# Patient Record
Sex: Male | Born: 1970 | Race: White | Hispanic: No | Marital: Married | Smoking: Former smoker
Health system: Southern US, Community
[De-identification: ages and names within clinical notes are randomized; demographics above are authoritative.]

## PROBLEM LIST (undated history)

## (undated) DIAGNOSIS — G4730 Sleep apnea, unspecified: Secondary | ICD-10-CM

## (undated) DIAGNOSIS — I10 Essential (primary) hypertension: Secondary | ICD-10-CM

## (undated) DIAGNOSIS — K219 Gastro-esophageal reflux disease without esophagitis: Secondary | ICD-10-CM

## (undated) DIAGNOSIS — F419 Anxiety disorder, unspecified: Secondary | ICD-10-CM

## (undated) HISTORY — DX: Anxiety disorder, unspecified: F41.9

## (undated) HISTORY — DX: Essential (primary) hypertension: I10

---

## 1995-11-13 HISTORY — PX: WISDOM TOOTH EXTRACTION: SHX21

## 2006-10-19 ENCOUNTER — Emergency Department: Payer: Self-pay | Admitting: Emergency Medicine

## 2007-07-05 ENCOUNTER — Ambulatory Visit: Payer: Self-pay | Admitting: Emergency Medicine

## 2009-08-12 ENCOUNTER — Encounter: Payer: Self-pay | Admitting: Family Medicine

## 2009-08-12 ENCOUNTER — Ambulatory Visit: Payer: Self-pay | Admitting: Family Medicine

## 2009-08-12 DIAGNOSIS — I10 Essential (primary) hypertension: Secondary | ICD-10-CM | POA: Insufficient documentation

## 2009-08-12 DIAGNOSIS — F411 Generalized anxiety disorder: Secondary | ICD-10-CM | POA: Insufficient documentation

## 2009-08-12 DIAGNOSIS — R0602 Shortness of breath: Secondary | ICD-10-CM | POA: Insufficient documentation

## 2009-08-18 ENCOUNTER — Telehealth: Payer: Self-pay | Admitting: Family Medicine

## 2009-08-24 ENCOUNTER — Ambulatory Visit: Payer: Self-pay | Admitting: Family Medicine

## 2009-08-25 ENCOUNTER — Ambulatory Visit: Payer: Self-pay | Admitting: Pulmonary Disease

## 2009-08-25 DIAGNOSIS — R0789 Other chest pain: Secondary | ICD-10-CM | POA: Insufficient documentation

## 2009-08-25 LAB — CONVERTED CEMR LAB
AST: 76 units/L — ABNORMAL HIGH (ref 0–37)
Alkaline Phosphatase: 73 units/L (ref 39–117)
BUN: 11 mg/dL (ref 6–23)
Bilirubin, Direct: 0.1 mg/dL (ref 0.0–0.3)
CO2: 24 meq/L (ref 19–32)
Calcium: 9.4 mg/dL (ref 8.4–10.5)
Creatinine, Ser: 1.2 mg/dL (ref 0.4–1.5)
Glucose, Bld: 96 mg/dL (ref 70–99)
Sodium: 136 meq/L (ref 135–145)
Total Bilirubin: 1.2 mg/dL (ref 0.3–1.2)
Total CHOL/HDL Ratio: 6

## 2009-09-09 ENCOUNTER — Ambulatory Visit: Payer: Self-pay | Admitting: Pulmonary Disease

## 2009-09-09 ENCOUNTER — Encounter: Payer: Self-pay | Admitting: Pulmonary Disease

## 2009-09-09 DIAGNOSIS — J45909 Unspecified asthma, uncomplicated: Secondary | ICD-10-CM | POA: Insufficient documentation

## 2009-10-14 ENCOUNTER — Ambulatory Visit: Payer: Self-pay | Admitting: Pulmonary Disease

## 2009-10-14 DIAGNOSIS — K219 Gastro-esophageal reflux disease without esophagitis: Secondary | ICD-10-CM | POA: Insufficient documentation

## 2009-10-20 ENCOUNTER — Encounter (INDEPENDENT_AMBULATORY_CARE_PROVIDER_SITE_OTHER): Payer: Self-pay | Admitting: *Deleted

## 2009-10-26 ENCOUNTER — Encounter (INDEPENDENT_AMBULATORY_CARE_PROVIDER_SITE_OTHER): Payer: Self-pay | Admitting: *Deleted

## 2009-10-26 ENCOUNTER — Ambulatory Visit: Payer: Self-pay | Admitting: Family Medicine

## 2009-12-16 ENCOUNTER — Ambulatory Visit: Payer: Self-pay | Admitting: Family Medicine

## 2009-12-16 DIAGNOSIS — E782 Mixed hyperlipidemia: Secondary | ICD-10-CM | POA: Insufficient documentation

## 2009-12-16 DIAGNOSIS — E785 Hyperlipidemia, unspecified: Secondary | ICD-10-CM | POA: Insufficient documentation

## 2010-02-14 ENCOUNTER — Telehealth: Payer: Self-pay | Admitting: Family Medicine

## 2010-04-03 ENCOUNTER — Telehealth: Payer: Self-pay | Admitting: Family Medicine

## 2010-04-13 ENCOUNTER — Telehealth: Payer: Self-pay | Admitting: Pulmonary Disease

## 2010-05-17 ENCOUNTER — Ambulatory Visit: Payer: Self-pay | Admitting: Family Medicine

## 2010-06-05 ENCOUNTER — Ambulatory Visit: Payer: Self-pay | Admitting: Family Medicine

## 2010-06-05 LAB — CONVERTED CEMR LAB
BUN: 11 mg/dL (ref 6–23)
Creatinine, Ser: 1.1 mg/dL (ref 0.4–1.5)
GFR calc non Af Amer: 77.49 mL/min (ref >60)
Glucose, Bld: 119 mg/dL — ABNORMAL HIGH (ref 70–99)
Potassium: 4.5 meq/L (ref 3.5–5.1)

## 2010-06-06 LAB — CONVERTED CEMR LAB
AST: 130 units/L — ABNORMAL HIGH (ref 0–37)
Alkaline Phosphatase: 82 units/L (ref 39–117)
Bilirubin, Direct: 0.2 mg/dL (ref 0.0–0.3)
Total Protein: 7.6 g/dL (ref 6.0–8.3)
VLDL: 19.8 mg/dL (ref 0.0–40.0)

## 2010-08-18 ENCOUNTER — Telehealth: Payer: Self-pay | Admitting: Family Medicine

## 2010-09-13 ENCOUNTER — Ambulatory Visit: Payer: Self-pay | Admitting: Family Medicine

## 2010-09-13 DIAGNOSIS — F1020 Alcohol dependence, uncomplicated: Secondary | ICD-10-CM | POA: Insufficient documentation

## 2010-09-13 DIAGNOSIS — R7401 Elevation of levels of liver transaminase levels: Secondary | ICD-10-CM | POA: Insufficient documentation

## 2010-09-13 DIAGNOSIS — R740 Nonspecific elevation of levels of transaminase and lactic acid dehydrogenase [LDH]: Secondary | ICD-10-CM

## 2010-09-15 LAB — CONVERTED CEMR LAB
Alkaline Phosphatase: 75 units/L (ref 39–117)
Bilirubin, Direct: 0.2 mg/dL (ref 0.0–0.3)
CO2: 28 meq/L (ref 19–32)
Calcium: 9.4 mg/dL (ref 8.4–10.5)
GFR calc non Af Amer: 95.9 mL/min (ref >60)
HDL: 49.9 mg/dL (ref >39.00)
Sodium: 138 meq/L (ref 135–145)
Total CHOL/HDL Ratio: 4
Triglycerides: 119 mg/dL (ref 0.0–149.0)

## 2010-09-19 ENCOUNTER — Encounter: Payer: Self-pay | Admitting: Family Medicine

## 2010-09-25 ENCOUNTER — Telehealth: Payer: Self-pay | Admitting: Family Medicine

## 2010-10-20 ENCOUNTER — Telehealth: Payer: Self-pay | Admitting: Family Medicine

## 2010-12-04 ENCOUNTER — Telehealth: Payer: Self-pay | Admitting: Family Medicine

## 2010-12-12 NOTE — Assessment & Plan Note (Signed)
Summary: REFILL MEDICATION/CLE   Vital Signs:  Patient profile:   40 year old [name] Height:      69 inches Weight:      221.50 pounds BMI:     32.83 Temp:     99.0 degrees F oral Pulse rate:   76 / minute Pulse rhythm:   regular BP sitting:   124 / 82  (left arm) Cuff size:   regular  Vitals Entered By: Linde Gillis CMA Duncan Dull) (May 17, 2010 12:18 PM) CC: refill medications   History of Present Illness: 40 yo here for follow up anxiety.   Anxiety-  Admits to me today that he stopped taking the Zoloft months ago because it was not helping with anxiety.  Using his Xanax less frequently, usually twice a week. Feels he does have better control over his anxiety.  Zoloft made him feel "sick" and like a "zombie."   Has not had any panic attacks. Now anxiety is typically related to big changes, like tests at school.  Current Medications (verified): 1)  Zestoretic 10-12.5 Mg Tabs (Lisinopril-Hydrochlorothiazide) .... Take 1 Tablet By Mouth Once A Day 2)  Omeprazole 40 Mg Cpdr (Omeprazole) .... Take 1 Tablet By Mouth Every Morning 3)  Alprazolam 0.5 Mg Tabs (Alprazolam) .Marland Kitchen.. 1-2 Tabs Every 8 Hours As Needed Anxiety  Allergies (verified): No Known Drug Allergies  Review of Systems Psych:  Complains of anxiety; denies depression, easily angered, easily tearful, suicidal thoughts/plans, thoughts of violence, unusual visions or sounds, and thoughts /plans of harming others.  Physical Exam  General:  alert, obese, normal work of breathing, less anxious.  Psych:  memory intact for recent and remote, normally interactive, and good eye contact, less anxious today.   Impression & Recommendations:  Problem # 1:  ANXIETY (ICD-300.00) 40 years old here for follow with patient 25 minutes, more than 50% of this time was spent counseling patient on anxiety. Discussed starting Paxil, but he seems to be having less episodes of anxiety. Continue as needed xanax, follow up in a few  weeks. We will see if once he is on vacation from school, anxiety will continue to improve.  The following medications were removed from the medication list:    Sertraline Hcl 100 Mg Tabs (Sertraline hcl) .Marland Kitchen... 1 by mouth daily His updated medication list for this problem includes:    Alprazolam 0.5 Mg Tabs (Alprazolam) .Marland Kitchen... 1-2 tabs every 8 hours as needed anxiety  Complete Medication List: 1)  Zestoretic 10-12.5 Mg Tabs (Lisinopril-hydrochlorothiazide) .... Take 1 tablet by mouth once a day 2)  Omeprazole 40 Mg Cpdr (Omeprazole) .... Take 1 tablet by mouth every morning 3)  Alprazolam 0.5 Mg Tabs (Alprazolam) .Marland Kitchen.. 1-2 tabs every 8 hours as needed anxiety Prescriptions: ALPRAZOLAM 0.5 MG TABS (ALPRAZOLAM) 1-2 tabs every 8 hours as needed anxiety  #60 x 0   Entered and Authorized by:   Ruthe Mannan MD   Signed by:   Ruthe Mannan MD on 05/17/2010   Method used:   Print then Give to Patient   RxID:   [id number] OMEPRAZOLE 40 MG CPDR (OMEPRAZOLE) Take 1 tablet by mouth every morning  #30 Capsule x 6   Entered and Authorized by:   Ruthe Mannan MD   Signed by:   Ruthe Mannan MD on 05/17/2010   Method used:   [facility]        [facility] [phone][facility] (retail)       [address]  [place], [place]  [address]       Ph: [phone] or [phone]       Fax: [phone]   RxID:   [id number]   Current Allergies (reviewed today): No known allergies

## 2010-12-12 NOTE — Progress Notes (Signed)
Summary: nos appt  Phone Note Call from Patient   Caller: [email or web address]  Call For: Jonathan [name] Summary of Call: LMTCB x2 to [facility]sc nos from 6/1. Initial call taken by: Darletta Moll,  April 13, 2010 3:41 PM

## 2010-12-12 NOTE — Progress Notes (Signed)
Summary: refill request for alprazolam  Phone Note Refill Request Message from:  Fax from Pharmacy  Refills Requested: Medication #1:  ALPRAZOLAM 0.5 MG TABS 1-2 tabs every 8 hours as needed anxiety.   Last Refilled: 06/05/2010 Faxed request from [place], [phone].  Initial call taken by: Lowella Petties CMA,  August 18, 2010 3:34 PM  Follow-up for Phone Call        Rx called to pharmacy Follow-up by: Benny Lennert CMA [facility] Dull),  August 18, 2010 4:21 PM    Prescriptions: ALPRAZOLAM 0.5 MG TABS (ALPRAZOLAM) 1-2 tabs every 8 hours as needed anxiety  #60 x 0   Entered and Authorized by:   Kerby Nora MD   Signed by:   Kerby Nora MD on 08/18/2010   Method used:   Telephoned to ...       [facility] [address] [phone]* (retail)       [address]       [place], [place]  [address]       Ph: [phone] or [phone]       Fax: [phone]   RxID:   [id number]

## 2010-12-12 NOTE — Assessment & Plan Note (Signed)
Summary: F/U/CLE   Vital Signs:  Patient profile:   40 year old male Height:      69 inches Weight:      223 pounds BMI:     33.05 Temp:     99.2 degrees F oral Pulse rate:   92 / minute Pulse rhythm:   regular BP sitting:   130 / 90  (left arm) Cuff size:   large  Vitals Entered By: Delilah Shan CMA Duncan Dull) ([birth year]  [birth year], [birth year] 3:01 PM) CC: F/U   History of Present Illness: 40 yo here for follow up cough/ SOB.  Cough worsened when he ran out of Prilosec. Now taking Omeprazole 40 mg daily, much improved. Has not had any reflux symptoms since. Stopped using all inhalers.  Anxiety- went on a cruise and had crushing chest pain for three days. Visited ER there, full cardiac work up negative (awaiting records).  Given Xanax and symtpoms disappeared.  Admits to me today that he stopped taking the Zoloft months ago because it was not helping with anxiety.  Gets profound panic attacks with chest pain anytime he travels, which is what happend on vacation last month. Feels it was also related to his shortness of breath because he has not had any issues since starting the Xanax.  Only using it a couple of times a week.   HLD- FLP in 10/10 showed LDL 176, otherwise normal.  He has been working on his diet since then.  Current Medications (verified): 1)  Zestoretic 10-12.5 Mg Tabs (Lisinopril-Hydrochlorothiazide) .... Take 1 Tablet By Mouth Once A Day 2)  Omeprazole 40 Mg Cpdr (Omeprazole) .... Take 1 Tablet By Mouth Every Morning 3)  Proair Hfa 108 (90 Base) Mcg/act  Aers (Albuterol Sulfate) .... 2 Puffs Every 4-6 Hours As Needed 4)  Sertraline Hcl 100 Mg Tabs (Sertraline Hcl) .Marland Kitchen.. 1 By Mouth Daily 5)  Alprazolam 0.5 Mg Tabs (Alprazolam) .Marland Kitchen.. 1-2 Tabs Every 8 Hours As Needed Anxiety  Allergies (verified): No Known Drug Allergies  Review of Systems      See HPI CV:  Denies chest pain or discomfort. Resp:  Denies cough and shortness of breath. Psych:  Complains of anxiety and panic  attacks; denies depression, easily angered, easily tearful, irritability, mental problems, suicidal thoughts/plans, thoughts of violence, unusual visions or sounds, and thoughts /plans of harming others.  Physical Exam  General:  alert, obese, normal work of breathing, less anxious.  Mouth:  Oral mucosa and oropharynx without lesions or exudates.  Teeth in good repair. Lungs:  normal respiratory effort, no intercostal retractions, no accessory muscle use, normal breath sounds, and no crackles, occasional experiatory wheezing.   Heart:  Normal rate and regular rhythm. S1 and S2 normal without gallop, murmur, click, rub or other extra sounds. Psych:  memory intact for recent and remote, normally interactive, and good eye contact, less anxious today.   Impression & Recommendations:  Problem # 1:  ANXIETY (ICD-300.00) Assessment Improved Time spent with patient 25 minutes, more than 50% of this time was spent counseling patient on anxiety. Will restart Zoloft, explained importance of taking something daily along with short acting benzo only as needed for panic attacks. Do not want to form dependency on Benzo and his anxiety is severe, needs something daily. Pt expressed understanding.  Follow up in one month.  The following medications were removed from the medication list:    Sertraline Hcl 50 Mg Tabs (Sertraline hcl) .Marland Kitchen... Take 1 tablet by mouth once a day His  updated medication list for this problem includes:    Sertraline Hcl 100 Mg Tabs (Sertraline hcl) .Marland Kitchen... 1 by mouth daily    Alprazolam 0.5 Mg Tabs (Alprazolam) .Marland Kitchen... 1-2 tabs every 8 hours as needed anxiety  Problem # 2:  HYPERLIPIDEMIA (ICD-272.4) Assessment: Unchanged Recheck next month.  Continue with dietary modifications.  Problem # 3:  GERD (ICD-530.81) Assessment: Improved Improved, cont Omeprazole. His updated medication list for this problem includes:    Omeprazole 40 Mg Cpdr (Omeprazole) .Marland Kitchen... Take 1 tablet by mouth  every morning  Complete Medication List: 1)  Zestoretic 10-12.5 Mg Tabs (Lisinopril-hydrochlorothiazide) .... Take 1 tablet by mouth once a day 2)  Omeprazole 40 Mg Cpdr (Omeprazole) .... Take 1 tablet by mouth every morning 3)  Proair Hfa 108 (90 Base) Mcg/act Aers (Albuterol sulfate) .... 2 puffs every 4-6 hours as needed 4)  Sertraline Hcl 100 Mg Tabs (Sertraline hcl) .Marland Kitchen.. 1 by mouth daily 5)  Alprazolam 0.5 Mg Tabs (Alprazolam) .Marland Kitchen.. 1-2 tabs every 8 hours as needed anxiety  Patient Instructions: 1)  Please come back to see me in month to follow up your anxiety and recheck your cholesterol. Prescriptions: ALPRAZOLAM 0.5 MG TABS (ALPRAZOLAM) 1-2 tabs every 8 hours as needed anxiety  #60 x 0   Entered and Authorized by:   Ruthe Mannan MD   Signed by:   Ruthe Mannan MD on 12/16/2009   Method used:   Print then Give to Patient   RxID:   [id number] SERTRALINE HCL 100 MG TABS (SERTRALINE HCL) 1 by mouth daily  #90 x 3   Entered and Authorized by:   Ruthe Mannan MD   Signed by:   Ruthe Mannan MD on 12/16/2009   Method used:   [facility]        [id number] ([facility]ail[facility]       [address]       [place], [place]  [address]       Ph: [phone] or [phone]       Fax: [phone]   RxID:   [id number]   Current Allergies (reviewed today): No known allergies

## 2010-12-12 NOTE — Progress Notes (Signed)
Summary: Rx Alprazolam  Phone Note Refill Request Call back at [phone] Message from:  [facility] [address], [place] on February 14, 2010 11:52 AM  Refills Requested: Medication #1:  ALPRAZOLAM 0.5 MG TABS 1-2 tabs every 8 hours as needed anxiety.   Last Refilled: 12/17/2009 Received faxed refill request please advise   Method Requested: Telephone to Pharmacy Initial call taken by: Linde Gillis CMA Duncan Dull),  February 14, 2010 11:53 AM  Follow-up for Phone Call        Rx called to pharmacy Follow-up by: Sydell Axon LPN,  February 14, 2010 2:26 PM    Prescriptions: ALPRAZOLAM 0.5 MG TABS (ALPRAZOLAM) 1-2 tabs every 8 hours as needed anxiety  #60 x 0   Entered and Authorized by:   Ruthe Mannan MD   Signed by:   Ruthe Mannan MD on 02/14/2010   Method used:   Handwritten   RxID:   [id number]

## 2010-12-12 NOTE — Progress Notes (Signed)
Summary: Alprazolam  Phone Note Refill Request Message from:  Fax from Pharmacy on Apr 03, 2010 10:27 AM  Refills Requested: Medication #1:  ALPRAZOLAM 0.5 MG TABS 1-2 tabs every 8 hours as needed anxiety. [facility] [place] [address], [place]  [phone]   Method Requested: Telephone to Pharmacy Initial call taken by: Delilah Shan CMA Duncan Dull),  Apr 03, 2010 10:28 AM  Follow-up for Phone Call        Rx called to pharmacy Follow-up by: Linde Gillis CMA Duncan Dull),  Apr 03, 2010 10:35 AM    Prescriptions: ALPRAZOLAM 0.5 MG TABS (ALPRAZOLAM) 1-2 tabs every 8 hours as needed anxiety  #60 x 0   Entered and Authorized by:   Ruthe Mannan MD   Signed by:   Ruthe Mannan MD on 04/03/2010   Method used:   Telephoned to ...       [facility] [address] [phone]* (retail)       [address]       [place], [place]  [address]       Ph: [phone] or [phone]       Fax: [phone]   RxID:   [id number]

## 2010-12-12 NOTE — Letter (Signed)
Summary: Correspondence from wife  Correspondence from wife   Imported By: Maryln Gottron 09/28/2010 15:11:09  _____________________________________________________________________  External Attachment:    Type:   Image     Comment:   External Document

## 2010-12-12 NOTE — Progress Notes (Signed)
Summary: refill request for alprazolam  Phone Note Refill Request Message from:  Fax from Pharmacy  Refills Requested: Medication #1:  ALPRAZOLAM 0.5 MG TABS 1-2 tabs every 8 hours as needed anxiety   Last Refilled: 09/25/2010 Faxed request from [place], [phone].  Initial call taken by: Lowella Petties CMA, AAMA,  October 20, 2010 10:17 AM  Follow-up for Phone Call        [facility]     Lowella Petties CMA, AAMA  October 20, 2010 11:15 AM     Prescriptions: ALPRAZOLAM 0.5 MG TABS (ALPRAZOLAM) 1-2 tabs every 8 hours as needed anxiety  #60 x 0   Entered and Authorized by:   Ruthe Mannan MD   Signed by:   Ruthe Mannan MD on 10/20/2010   Method used:   Telephoned to ...       [facility] [address] [phone]* (retail)       [address]       [place], [place]  [address]       Ph: [phone] or [phone]       Fax: [phone]   RxID:   [id number]

## 2010-12-12 NOTE — Assessment & Plan Note (Signed)
Summary: cpx pt wants labs/rbh   Vital Signs:  Patient profile:   40 year old male Height:      69 inches Weight:      218 pounds BMI:     32.31 Temp:     98.9 degrees F oral Pulse rate:   68 / minute Pulse rhythm:   regular BP sitting:   140 / 100  (left arm) Cuff size:   regular  Vitals Entered By: Linde Gillis CMA Duncan Dull) (June 05, 2010 8:21 AM) CC: complete physical   History of Present Illness: 40 yo here for CPX.   Anxiety-  Admits to me today that he stopped taking the Zoloft months ago because it was not helping with anxiety.  Using his Xanax less frequently, usually twice a week. Feels he does have better control over his anxiety.  Zoloft made him feel "sick" and like a "zombie."   HLD- missed his follow up appt to discuss (see [facility] flowsheet). Admits to not watching his diet.  Wants to get on the right track.  elevated LFTs- admits to often drinking 6 beers a night.  Does not think he has a problem but his wife gets very annoyed by his drinking.  says he does not NEED to drink but he likes cold beer in the summer.  Increased mucous drainage- for past several months, has noticed more drainage in back of ihs throat, cannot break it up.  Sometimes makes him gag.  Taking Nexium for GERD, feels these symptoms are different.  No difficulty swallowing, nausea or abdominal pain.  Current Medications (verified): 1)  Zestoretic 10-12.5 Mg Tabs (Lisinopril-Hydrochlorothiazide) .... Take 1 Tablet By Mouth Once A Day 2)  Omeprazole 40 Mg Cpdr (Omeprazole) .... Take 1 Tablet By Mouth Every Morning 3)  Alprazolam 0.5 Mg Tabs (Alprazolam) .Marland Kitchen.. 1-2 Tabs Every 8 Hours As Needed Anxiety  Allergies (verified): No Known Drug Allergies  Past History:  Past Medical History: Last updated: 08/25/2009  ANXIETY (ICD-300.00) HYPERTENSION (ICD-401.9)  Past Surgical History: Last updated: 08/25/2009 none  Family History: Last updated: 08/12/2009 Mom, A, 60s- has breast cancer Dad,  A, 60s- had prostate cancer  Social History: Last updated: 08/25/2009 Pt is married and lives with wife Jonathan [name].   Pt does not have any children. 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  Review of Systems      See HPI General:  Denies fatigue. Eyes:  Denies blurring. ENT:  Denies difficulty swallowing. CV:  Denies chest pain or discomfort. Resp:  Denies shortness of breath. GI:  Denies bloody stools, nausea, and vomiting. MS:  Denies joint pain, joint redness, and joint swelling. Derm:  Denies rash. Neuro:  Denies headaches, visual disturbances, and weakness. Psych:  Denies anxiety and depression. Endo:  Denies excessive thirst and excessive urination. Heme:  Denies abnormal bruising.  Physical Exam  General:  alert, obese, normal work of breathing, less anxious.  Head:  normocephalic and atraumatic.   Eyes:  vision grossly intact, pupils equal, pupils round, and pupils reactive to light.   Ears:  R ear normal and L ear normal.   Nose:  no external deformity.   Mouth:  good dentition.   Lungs:  normal respiratory effort, no intercostal retractions, and no accessory muscle use.   Heart:  normal rate and regular rhythm.  Abdomen:  Bowel sounds positive,abdomen soft and non-tender without masses, organomegaly or hernias noted. Msk:  No deformity or scoliosis noted of thoracic or lumbar spine.   Extremities:  No clubbing, cyanosis, edema, or deformity noted with normal full range of motion of all joints.   Neurologic:  alert & oriented X3 and gait normal.   Skin:  Intact without suspicious lesions or rashes Psych:  memory intact for recent and remote, normally interactive, and good eye contact, less anxious today.   Impression & Recommendations:  Problem # 1:  Preventive Health Care (ICD-V70.0) Reviewed preventive care protocols,  scheduled due services, and updated immunizations Discussed nutrition, exercise, diet, and healthy lifestyle.  Problem # 2:  HYPERLIPIDEMIA (ICD-272.4) Assessment: Unchanged recheck FLP today. Discussed  decreasing added sugars, eliminate trans fats, increase fiber and limit alcohol.    Orders: TLB-Lipid Panel (80061-LIPID)  Problem # 3:  ANXIETY (ICD-300.00) Assessment: Unchanged stable.  Does not want daily medication, not abusing Benzo. His updated medication list for this problem includes:    Alprazolam 0.5 Mg Tabs (Alprazolam) .Marland Kitchen... 1-2 tabs every 8 hours as needed anxiety  Complete Medication List: 1)  Zestoretic 10-12.5 Mg Tabs (Lisinopril-hydrochlorothiazide) .... Take 1 tablet by mouth once a day 2)  Omeprazole 40 Mg Cpdr (Omeprazole) .... Take 1 tablet by mouth every morning 3)  Alprazolam 0.5 Mg Tabs (Alprazolam) .Marland Kitchen.. 1-2 tabs every 8 hours as needed anxiety  Other Orders: TLB-Hepatic/Liver Function Pnl (80076-HEPATIC) TLB-BMP (Basic Metabolic Panel-BMET) (80048-METABOL)  Patient Instructions: 1)  Good to see you. 2)  Let's try Mucinex (as directed on box) for next 2 weeks.  See if it helps, if not let me know.  Then we can refer to GI. Prescriptions: ALPRAZOLAM 0.5 MG TABS (ALPRAZOLAM) 1-2 tabs every 8 hours as needed anxiety  #60 x 0   Entered and Authorized by:   Ruthe Mannan MD   Signed by:   Ruthe Mannan MD on 06/05/2010   Method used:   Print then Mail to Patient   RxID:   [id number]   Current Allergies (reviewed today): No known allergies   Prevention & Chronic Care Immunizations   Influenza vaccine: Not documented    Tetanus booster: 08/25/2004: historical    Tetanus booster due: 08/25/2014    Pneumococcal vaccine: Not documented  Other Screening   Smoking status: Not documented  Lipids   Total Cholesterol: 221  (08/24/2009)   Lipid panel action/deferral: Lipid Panel ordered   LDL: Not documented   LDL Direct: 176.7  (08/24/2009)   HDL:  35.80  (08/24/2009)   Triglycerides: 134.0  (08/24/2009)    SGOT (AST): 76  (08/24/2009)   BMP action: Ordered   SGPT (ALT): 143  (08/24/2009)   Alkaline phosphatase: 73  (08/24/2009)   Total bilirubin: 1.2  (08/24/2009)    Lipid flowsheet reviewed?: Yes   Progress toward LDL goal: Unchanged  Hypertension   Last Blood Pressure: 140 / 100  (06/05/2010)   Serum creatinine: 1.2  (08/24/2009)   Serum potassium 4.8  (08/24/2009)  Self-Management Support :    Hypertension self-management support: Not documented    Lipid self-management support: Not documented

## 2010-12-12 NOTE — Progress Notes (Signed)
Summary: refill request for alprazolam  Phone Note Refill Request Message from:  Fax from Pharmacy  Refills Requested: Medication #1:  ALPRAZOLAM 0.5 MG TABS 1-2 tabs every 8 hours as needed anxiety   Last Refilled: 08/18/2010 Faxed request from [place], [phone]  Initial call taken by: Lowella Petties CMA, AAMA,  September 25, 2010 11:57 AM  Follow-up for Phone Call        [facility]  Follow-up by: Lowella Petties CMA, AAMA,  September 25, 2010 3:07 PM    Prescriptions: ALPRAZOLAM 0.5 MG TABS (ALPRAZOLAM) 1-2 tabs every 8 hours as needed anxiety  #60 x 0   Entered and Authorized by:   Ruthe Mannan MD   Signed by:   Ruthe Mannan MD on 09/25/2010   Method used:   Telephoned to ...       [facility] [address] [phone]* (retail)       [address]       [place], [place]  [address]       Ph: [phone] or [phone]       Fax: [phone]   RxID:   [id number]

## 2010-12-12 NOTE — Assessment & Plan Note (Signed)
Summary: DISCUSS MEDS & BLOOD TEST RESULTS / LFW   Vital Signs:  Patient profile:   40 year old male Height:      69 inches Weight:      218 pounds BMI:     32.31 Temp:     98.4 degrees F oral Pulse rate:   76 / minute Pulse rhythm:   regular BP sitting:   130 / 92  (right arm) Cuff size:   regular  Vitals Entered By: Linde Gillis CMA Duncan Dull) (September 13, 2010 10:46 AM) CC: discuss labs from last visit, discuss changing BP medication which is making him cough   History of Present Illness: 40 yo here to discuss labs.     HLD- missed his follow up appts to discuss (see [facility] flowsheet). Admits to not watching his diet.  Here with wife today who states that Jonathan [name] has not improved his diet at all, has a chicken biscuit every morning.  LDL was 194 in July.  MIssed follow up lab visit.  Cannot start statin or Niacin because LFTs so elevated.    elevated LFTs- in July, AST 130, ALT 241.  I had an extensive telephone conversation wtih him about his drinking.  He was going to cut back.  Wife states that he still drinks up to 12 beers a night.  He still does not think he has a problem.  Wife is very annoyed that he continues to drink when he knows his lipds and LFTs are elevated.    Increased mucous drainage- .  Taking Nexium for GERD, feels his reflux is better but still has dry cough and gags in the morning.  No difficulty swallowing, nausea or abdominal pain.   (see pulm notes).  Current Medications (verified): 1)  Omeprazole 40 Mg Cpdr (Omeprazole) .... Take 1 Tablet By Mouth Every Morning 2)  Alprazolam 0.5 Mg Tabs (Alprazolam) .Marland Kitchen.. 1-2 Tabs Every 8 Hours As Needed Anxiety 3)  Hydrochlorothiazide 25 Mg  Tabs (Hydrochlorothiazide) .... Take 1 Tab By Mouth Every Morning  Allergies (verified): No Known Drug Allergies  Past History:  Past Medical History: Last updated: 08/25/2009  ANXIETY (ICD-300.00) HYPERTENSION (ICD-401.9)  Past Surgical History: Last updated:  08/25/2009 none  Family History: Last updated: 08/12/2009 Mom, A, 60s- has breast cancer Dad, A, 60s- had prostate cancer  Social History: Last updated: 08/25/2009 Pt is married and lives with wife Jonathan [name].   Pt does not have any children. 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  Review of Systems      See HPI General:  Denies fever and malaise. Resp:  Complains of cough; denies wheezing. GI:  Complains of indigestion; denies abdominal pain, bloody stools, change in bowel habits, loss of appetite, nausea, vomiting, and yellowish skin color.  Physical Exam  General:  alert, obese, normal work of breathing, less anxious.  Lungs:  normal respiratory effort, no intercostal retractions, and no accessory muscle use.   Heart:  normal rate and regular rhythm.   Abdomen:  Bowel sounds positive,abdomen soft and non-tender without masses, organomegaly or hernias noted. Psych:  memory intact for recent and remote, normally interactive, and good eye contact, less anxious today.   Impression & Recommendations:  Problem # 1:  TRANSAMINASES, SERUM, ELEVATED (ICD-790.4) Assessment New In the setting of ETOH abuse. Time spent with patient and  his wife 25 minutes, more than 50% of this time was spent counseling patient on alcohol abuse and potential for liver failure and hyperlipidemia.  Pt still feels he can cut back on his own.  Wife will call me with an update.  Repeat  LFTs today.  Orders: TLB-Hepatic/Liver Function Pnl (80076-HEPATIC)  Problem # 2:  INTRINSIC ASTHMA, UNSPECIFIED (ICD-493.10) Assessment: Unchanged Will d/c ACEI to see if this helps.  See pt instructions for details.  Problem # 3:  HYPERLIPIDEMIA (ICD-272.4) Assessment: Unchanged recheck lipid panel today.  Discussed dietary changes again with Barbara Cower today.   Orders: Venipuncture  (04540) TLB-Lipid Panel (80061-LIPID)  Problem # 4:  ALCOHOL ABUSE (ICD-305.00) Assessment: Unchanged see #1.  Discussed addiction specialist.  They will talk about it as a couple and get back to me.  Problem # 5:  HYPERTENSION (ICD-401.9) Assessment: Unchanged D/c Zestoretic.  Start HCTZ 25 mg daily.  Recheck BMET today. The following medications were removed from the medication list:    Zestoretic 10-12.5 Mg Tabs (Lisinopril-hydrochlorothiazide) .Marland Kitchen... Take 1 tablet by mouth once a day His updated medication list for this problem includes:    Hydrochlorothiazide 25 Mg Tabs (Hydrochlorothiazide) .Marland Kitchen... Take 1 tab by mouth every morning  Orders: TLB-BMP (Basic Metabolic Panel-BMET) (80048-METABOL)  Complete Medication List: 1)  Omeprazole 40 Mg Cpdr (Omeprazole) .... Take 1 tablet by mouth every morning 2)  Alprazolam 0.5 Mg Tabs (Alprazolam) .Marland Kitchen.. 1-2 tabs every 8 hours as needed anxiety 3)  Hydrochlorothiazide 25 Mg Tabs (Hydrochlorothiazide) .... Take 1 tab by mouth every morning  Patient Instructions: 1)  Decrease added sugars, eliminate trans fats, increase fiber and limit alcohol.  2)  You have to back off on your drinking.   3)  Stop taking the Zestoretic. 4)  Start hydrochlorothiazide. Prescriptions: HYDROCHLOROTHIAZIDE 25 MG  TABS (HYDROCHLOROTHIAZIDE) Take 1 tab by mouth every morning  #90 x 3   Entered and Authorized by:   Ruthe Mannan MD   Signed by:   Ruthe Mannan MD on 09/13/2010   Method used:   [facility]        [id number]       [address]       [place], [place]  [address]       Ph: [phone] or [phone]       Fax: [phone]   RxID:   [id number]    Orders Added: 1)  Venipuncture [47425] 2)  TLB-Lipid Panel [80061-LIPID] 3)  TLB-BMP (Basic Metabolic Panel-BMET) [80048-METABOL] 4)  TLB-Hepatic/Liver Function Pnl [80076-HEPATIC] 5)  Est. Patient Level IV [95638]    Current Allergies (reviewed today): No  known allergies

## 2010-12-14 NOTE — Progress Notes (Signed)
Summary: alprazolam  Phone Note Refill Request Message from:  Fax from Pharmacy on December 04, 2010 3:41 PM  Refills Requested: Medication #1:  ALPRAZOLAM 0.5 MG TABS 1-2 tabs every 8 hours as needed anxiety   Last Refilled: 10/20/2010 Refill request from [facility]  [address] [phone]  Initial call taken by: Melody Comas,  December 04, 2010 3:42 PM  Follow-up for Phone Call        Rx called to pharmacy Follow-up by: Linde Gillis CMA Duncan Dull),  December 04, 2010 4:25 PM    Prescriptions: ALPRAZOLAM 0.5 MG TABS (ALPRAZOLAM) 1-2 tabs every 8 hours as needed anxiety  #60 x 0   Entered and Authorized by:   Ruthe Mannan MD   Signed by:   Ruthe Mannan MD on 12/04/2010   Method used:   [facility]ph[facility] ...       [facility] [address] [phone][facility] (retail)       [address]       [place], [place]  [address]       Ph: [phone] or [phone]       Fax: [phone]   RxID:   [id number]

## 2011-01-22 ENCOUNTER — Telehealth: Payer: Self-pay | Admitting: Family Medicine

## 2011-01-30 NOTE — Progress Notes (Signed)
Summary: alprazolam  Phone Note Refill Request Message from:  Fax from Pharmacy on January 22, 2011 9:12 AM  Refills Requested: Medication #1:  ALPRAZOLAM 0.5 MG TABS 1-2 tabs every 8 hours as needed anxiety   Last Refilled: 12/04/2010 [facility] [address] [place] [phone].  Initial call taken by: Melody Comas,  January 22, 2011 9:13 AM  Follow-up for Phone Call        Rx called to pharmacy Follow-up by: Linde Gillis CMA Duncan Dull),  January 22, 2011 9:27 AM    Prescriptions: ALPRAZOLAM 0.5 MG TABS (ALPRAZOLAM) 1-2 tabs every 8 hours as needed anxiety  #60 x 0   Entered and Authorized by:   Ruthe Mannan MD   Signed by:   Ruthe Mannan MD on 01/22/2011   Method used:   [facility]ph[facility] ...       [facility]  [address] [phone][facility] (retail)       [address]       [place], [place]  [address]       Ph: [phone] or [phone]       Fax: [phone]   RxID:   [id number]

## 2011-03-12 ENCOUNTER — Emergency Department: Payer: Self-pay | Admitting: Unknown Physician Specialty

## 2011-03-29 ENCOUNTER — Other Ambulatory Visit: Payer: Self-pay | Admitting: *Deleted

## 2011-03-30 ENCOUNTER — Other Ambulatory Visit: Payer: Self-pay | Admitting: *Deleted

## 2011-03-30 MED ORDER — ALPRAZOLAM 0.5 MG PO TABS: ORAL_TABLET | ORAL | Status: DC

## 2011-03-30 NOTE — Telephone Encounter (Signed)
Rx called to pharmacy

## 2011-04-01 NOTE — Telephone Encounter (Signed)
Opened in error

## 2011-05-24 ENCOUNTER — Other Ambulatory Visit: Payer: Self-pay | Admitting: *Deleted

## 2011-05-24 MED ORDER — ALPRAZOLAM 0.5 MG PO TABS: ORAL_TABLET | ORAL | Status: DC

## 2011-05-24 NOTE — Telephone Encounter (Signed)
[facility] 

## 2011-07-03 ENCOUNTER — Other Ambulatory Visit: Payer: Self-pay | Admitting: *Deleted

## 2011-07-03 MED ORDER — OMEPRAZOLE 40 MG PO CPDR: 40.0000 mg | DELAYED_RELEASE_CAPSULE | ORAL | Status: DC

## 2011-08-09 ENCOUNTER — Other Ambulatory Visit: Payer: Self-pay | Admitting: *Deleted

## 2011-08-09 MED ORDER — ALPRAZOLAM 0.5 MG PO TABS: ORAL_TABLET | ORAL | Status: DC

## 2011-08-09 NOTE — Telephone Encounter (Signed)
[facility].  Left message on voicemail for pharmacist that patient needs to schedule a f/u appt before any further refills will be given.

## 2011-08-09 NOTE — Telephone Encounter (Signed)
Last OV 09/13/2010, please advise.

## 2011-08-09 NOTE — Telephone Encounter (Signed)
Ok to refill one month supply.  Needs to be seen for further refills.

## 2011-08-29 ENCOUNTER — Other Ambulatory Visit: Payer: Self-pay | Admitting: *Deleted

## 2011-08-29 MED ORDER — HYDROCHLOROTHIAZIDE 25 MG PO TABS: 25.0000 mg | ORAL_TABLET | ORAL | Status: DC

## 2011-09-18 ENCOUNTER — Encounter: Payer: Self-pay | Admitting: Family Medicine

## 2011-09-19 ENCOUNTER — Ambulatory Visit: Payer: Self-pay | Admitting: Family Medicine

## 2011-09-19 DIAGNOSIS — Z0289 Encounter for other administrative examinations: Secondary | ICD-10-CM

## 2011-10-22 ENCOUNTER — Other Ambulatory Visit: Payer: Self-pay | Admitting: *Deleted

## 2011-10-22 NOTE — Telephone Encounter (Signed)
Can have one month supply.  No additional refills until he comes in to office

## 2011-10-22 NOTE — Telephone Encounter (Signed)
Patient no showed last office visit scheduled for 09/19/2011, please advise.

## 2011-10-23 MED ORDER — ALPRAZOLAM 0.5 MG PO TABS: ORAL_TABLET | ORAL | Status: DC

## 2011-10-23 NOTE — Telephone Encounter (Signed)
[facility] called [facility].  Patient advised as instructed via telephone and he stated that he will make a f/u appt in January.

## 2011-11-10 ENCOUNTER — Other Ambulatory Visit: Payer: Self-pay | Admitting: Family Medicine

## 2012-01-21 ENCOUNTER — Other Ambulatory Visit: Payer: Self-pay | Admitting: *Deleted

## 2012-01-21 MED ORDER — ALPRAZOLAM 0.5 MG PO TABS: ORAL_TABLET | ORAL | Status: DC

## 2012-01-21 NOTE — Telephone Encounter (Signed)
Received faxed refill request from pharmacy for Alprazolam. Is it okay to refill medication? 

## 2012-01-22 NOTE — Telephone Encounter (Signed)
Medicine called to pharmacy. 

## 2012-01-23 ENCOUNTER — Encounter: Payer: Self-pay | Admitting: Family Medicine

## 2012-01-23 ENCOUNTER — Ambulatory Visit (INDEPENDENT_AMBULATORY_CARE_PROVIDER_SITE_OTHER): Payer: PRIVATE HEALTH INSURANCE | Admitting: Family Medicine

## 2012-01-23 VITALS — BP 148/104 | HR 84 | Temp 98.5°F | Wt 212.0 lb

## 2012-01-23 DIAGNOSIS — F411 Generalized anxiety disorder: Secondary | ICD-10-CM

## 2012-01-23 DIAGNOSIS — R7401 Elevation of levels of liver transaminase levels: Secondary | ICD-10-CM

## 2012-01-23 DIAGNOSIS — F1010 Alcohol abuse, uncomplicated: Secondary | ICD-10-CM

## 2012-01-23 DIAGNOSIS — E785 Hyperlipidemia, unspecified: Secondary | ICD-10-CM

## 2012-01-23 LAB — COMPREHENSIVE METABOLIC PANEL
ALT: 259 U/L — ABNORMAL HIGH (ref 0–53)
AST: 228 U/L — ABNORMAL HIGH (ref 0–37)
Alkaline Phosphatase: 93 U/L (ref 39–117)
CO2: 30 mEq/L (ref 19–32)
Creatinine, Ser: 0.9 mg/dL (ref 0.4–1.5)
Sodium: 140 mEq/L (ref 135–145)
Total Bilirubin: 1 mg/dL (ref 0.3–1.2)
Total Protein: 8.1 g/dL (ref 6.0–8.3)

## 2012-01-23 LAB — LIPID PANEL
HDL: 51.4 mg/dL (ref >39.00)
Total CHOL/HDL Ratio: 5
VLDL: 27 mg/dL (ref 0.0–40.0)

## 2012-01-23 MED ORDER — ALPRAZOLAM 0.5 MG PO TABS: ORAL_TABLET | ORAL | Status: DC

## 2012-01-23 NOTE — Patient Instructions (Signed)
Congratulations on your new job and cutting back on drinking. We will call you with your lab work.

## 2012-01-23 NOTE — Progress Notes (Signed)
  41 yo who has missed several appointments here to refill his meds.  Anxiety-  Drinking less (1-2 beers per day).  Using his Xanax less frequently, usually twice a week. Just got job at post [employer]. Feels he does have better control over his anxiety now that he has more purpose.   HLD- Lab Results  Component Value Date   CHOL 223* 09/13/2010   HDL 49.90 09/13/2010   LDLDIRECT 158.0 09/13/2010   TRIG 119.0 09/13/2010   CHOLHDL 4 09/13/2010   Admits to not watching his diet. Wants to get on the right track.   elevated LFTs-  Missed his follow up lab work.  At times, does have some fleeting RUQ but usually goes away. No associated nausea or vomiting. Lab Results  Component Value Date   ALT 312* 09/13/2010   AST 226* 09/13/2010   ALKPHOS 75 09/13/2010   BILITOT 1.1 09/13/2010   HTN- had previously been stable on HCTZ 25 mg daily. Usually elevated in office due to anxiety. Has not taken his medication today. No CP or SOB.  Review of Systems  See HPI   Physical Exam  BP 148/104  Pulse 84  Temp(Src) 98.5 F (36.9 C) (Oral)  Wt 212 lb (96.163 kg)  General: alert, obese, normal work of breathing, less anxious.  Abd:  Soft, NT, pos BS Neurologic: alert & oriented X3 and gait normal.  Skin: Intact without suspicious lesions or rashes  Psych: memory intact for recent and remote, normally interactive, and good eye contact, less anxious today.  Assessment and Plan:  1. ANXIETY  Improved per pt now that he has a job and feels like he has moe purpose.  Unable to drink as much now that he is working.   2. ALCOHOL ABUSE  Improved, see above.   3. HYPERLIPIDEMIA  Recheck lipids today. Lipid Panel  4. TRANSAMINASES, SERUM, ELEVATED  Lost to follow up- recheck CMET today. Comprehensive metabolic panel

## 2012-01-24 ENCOUNTER — Other Ambulatory Visit: Payer: Self-pay | Admitting: Family Medicine

## 2012-01-24 DIAGNOSIS — R7401 Elevation of levels of liver transaminase levels: Secondary | ICD-10-CM

## 2012-01-28 ENCOUNTER — Encounter: Payer: Self-pay | Admitting: Family Medicine

## 2012-01-28 ENCOUNTER — Ambulatory Visit: Payer: Self-pay | Admitting: Family Medicine

## 2012-02-11 ENCOUNTER — Telehealth: Payer: Self-pay | Admitting: Family Medicine

## 2012-02-11 NOTE — Telephone Encounter (Signed)
Caller: Jonathan [name]/Patient; PCP: Ruthe Mannan (Nestor Ramp); CB#: [phone]; Call regarding Injury/Trauma; R hand pain with mild swelling after working in the yard x 2 wks ago. Unknown injury. Skin intact, no obvious deformity. Appt sched for 02/12/12 @ 0900 with Dr. Dayton Martes.

## 2012-02-12 ENCOUNTER — Ambulatory Visit (INDEPENDENT_AMBULATORY_CARE_PROVIDER_SITE_OTHER): Payer: PRIVATE HEALTH INSURANCE | Admitting: Family Medicine

## 2012-02-12 ENCOUNTER — Encounter: Payer: Self-pay | Admitting: Family Medicine

## 2012-02-12 ENCOUNTER — Telehealth: Payer: Self-pay | Admitting: Family Medicine

## 2012-02-12 VITALS — BP 150/108 | HR 88 | Temp 98.7°F | Wt 216.0 lb

## 2012-02-12 DIAGNOSIS — I10 Essential (primary) hypertension: Secondary | ICD-10-CM

## 2012-02-12 DIAGNOSIS — M6530 Trigger finger, unspecified finger: Secondary | ICD-10-CM

## 2012-02-12 MED ORDER — LOSARTAN POTASSIUM-HCTZ 50-12.5 MG PO TABS: 1.0000 | ORAL_TABLET | Freq: Every day | ORAL | Status: DC

## 2012-02-12 NOTE — Telephone Encounter (Signed)
Triage Record Num: [id number] Operator: Kamila Elliott Patient Name: Jonathan [name] Call Date & Time: 02/11/2012 4:41:32PM Patient Phone: [phone] PCP: Talia Aron Patient Gender: Male PCP Fax : [phone] Patient DOB: 09/09/1971 Practice Name: [facility] Reason for Call: Caller: Aleczander/Patient; PCP: Aron, Talia (Tallie-ya); CB#: [phone]; Call regarding Injury/Trauma; R hand pain with mild swelling after working in the yard x 2 wks ago. Unknown injury. Skin intact, no obvious deformity. Appt sched for 02/12/12 @ 0900 with Dr. Aron. Hand Injury Protocol. Protocol(s) Used: Hand Injury Recommended Outcome per Protocol: See Provider within 24 hours Reason for Outcome: New marked swelling (twice the normal size as compared to usual appearance) for at least 24 hours that has not improved with home care Care Advice: ~ Call provider if symptoms worsen or new symptoms develop. ~ Remove any rings on the fingers of the affected hand, if possible. ~ Support injured part in position of comfort to reduce pain and prevent further damage; avoid unnecessary movement. ~ Elevate part to improve circulation and decrease discomfort. Analgesic/Antipyretic Advice - Acetaminophen: Consider acetaminophen as directed on label or by pharmacist/provider for pain or fever PRECAUTIONS: - Use if there is no history of liver disease, alcoholism, or intake of three or more alcohol drinks per day - Only if approved by provider during pregnancy or when breastfeeding - During pregnancy, acetaminophen should not be taken more than 3 consecutive days without telling provider - Do not exceed recommended dose or frequency ~ 02/11/2012 4:53:59PM Page 1 of 1 [id number] 

## 2012-02-12 NOTE — Patient Instructions (Addendum)
It was good to see you. Please take Ibuprofen- up to 400 mg three times daily for next several days. Ice finger when appropriate. If this does not improve the pain and swelling, let me know and we can inject the finger with steroids. Stop your hydrochlorothiazide- we are starting a combination pill- losartan/HCTZ. Call me in 2 weeks with an update of your blood pressure.

## 2012-02-12 NOTE — Progress Notes (Signed)
  Subjective:    Patient ID: Jonathan [name], male    DOB: [birth year], 41 y.o.   MRN: [id number]  HPI  41 yo with: 1.  Right 1st finger pain and swelling- noticed it two weeks ago.  Difficult to bend and extend his finger. No known injury.   No warmth or redness. Never had anything like this before.  2.HTN- BP has been elevated at past two office visit. BP Readings from Last 3 Encounters:  02/12/12 150/108  01/23/12 148/104  09/13/10 130/92   Admits to not eating well- lots of fast food. Starting a new job soon, hopes his diet will improve. Was on an ACEI a couple of years ago but stopped it as ?if it was worsening his cough. No CP, SOB, LE edema or blurred vision.  Review of Systems See HPI    Objective:   Physical Exam  BP 150/108  Pulse 88  Temp(Src) 98.7 F (37.1 C) (Oral)  Wt 216 lb (97.977 kg) BP Readings from Last 3 Encounters:  02/12/12 150/108  01/23/12 148/104  09/13/10 130/92  General:  overweght male in NAD Eyes:  PERRL Ears:  External ear exam shows no significant lesions or deformities.  Otoscopic examination reveals clear canals, tympanic membranes are intact bilaterally without bulging, retraction, inflammation or discharge. Hearing is grossly normal bilaterally. Nose:  External nasal examination shows no deformity or inflammation. Nasal mucosa are pink and moist without lesions or exudates. Mouth:  Oral mucosa and oropharynx without lesions or exudates.  Teeth in good repair. Neck:  no carotid bruit or thyromegaly no cervical or supraclavicular lymphadenopathy  Lungs:  Normal respiratory effort, chest expands symmetrically. Lungs are clear to auscultation, no crackles or wheezes. Heart:  Normal rate and regular rhythm. S1 and S2 normal without gallop, murmur, click, rub or other extra sounds. Pulses:  R and L posterior tibial pulses are full and equal bilaterally  Extremities:  Right hand:  1st digit swollen, difficulty with flexion and extension, TTP at  base of the finger, directly over the tendon as it courses over the metacarpal head     Assessment & Plan:   1. HYPERTENSION  Deteriorated. Will place on ARB/HCTZ combo.  Discussed possibility of cough-  Pt to call in 2 weeks with update of BP. The patient indicates understanding of these issues and agrees with the plan.   2. Trigger finger  New.  Hand out given from sports medicine advisor. Advised short course of NSAID (with caution given h/o HTN) and ice. See pt instructions for details.

## 2012-02-28 ENCOUNTER — Other Ambulatory Visit: Payer: Self-pay | Admitting: *Deleted

## 2012-02-28 MED ORDER — ALPRAZOLAM 0.5 MG PO TABS: ORAL_TABLET | ORAL | Status: DC

## 2012-02-28 NOTE — Telephone Encounter (Signed)
Medicine called to pharmacy. 

## 2012-02-28 NOTE — Telephone Encounter (Signed)
Faxed request from [facility], no last filled date given.

## 2012-03-07 ENCOUNTER — Other Ambulatory Visit: Payer: Self-pay | Admitting: Family Medicine

## 2012-04-01 ENCOUNTER — Other Ambulatory Visit: Payer: Self-pay | Admitting: *Deleted

## 2012-04-01 MED ORDER — ALPRAZOLAM 0.5 MG PO TABS: ORAL_TABLET | ORAL | Status: DC

## 2012-04-01 NOTE — Telephone Encounter (Signed)
Medicine called to pharmacy. 

## 2012-04-01 NOTE — Telephone Encounter (Signed)
Faxed refill request from [facility], last filled date not given.

## 2012-04-02 ENCOUNTER — Other Ambulatory Visit: Payer: Self-pay | Admitting: Family Medicine

## 2012-05-02 ENCOUNTER — Other Ambulatory Visit: Payer: Self-pay | Admitting: *Deleted

## 2012-05-02 MED ORDER — ALPRAZOLAM 0.5 MG PO TABS: ORAL_TABLET | ORAL | Status: DC

## 2012-05-02 NOTE — Telephone Encounter (Signed)
Faxed refill request from [facility].

## 2012-05-02 NOTE — Telephone Encounter (Signed)
[facility] 

## 2012-05-19 ENCOUNTER — Telehealth: Payer: Self-pay | Admitting: Family Medicine

## 2012-05-19 NOTE — Telephone Encounter (Signed)
Caller: Jonathan [name]/Patient; PCP: Ruthe Mannan (Nestor Ramp); CB#: [phone]; ; ; Call regarding Chest Pain/Chest Discomfort; Patient reports pain on the right side of his chest that is radiating to his shoulder blade. Patient reports that he has taken Ibuprofen. Patient reports that the pain is worse when he takes in deep breaths. Emergent s/s of "Moderate to severe pain occurring with deep breath or a productive cough for one full day or more" positive per Chest Pain guideline. Instructed patient to go to [facility] ED at this time for evaluation. Patient does not agree to go to ED at this time. He reports that he will wait for his wife to get home and decide if he wants to go at that time. Informed patient that I could call someone to help take him to get to the hospital if he knows a number. Patient refuses this. Instructed patient to call 911 if pain gets severe, or if any other symptoms develop.

## 2012-05-20 NOTE — Telephone Encounter (Signed)
Spoke with patient.  He said he went to ER in [place] last night.  He had several tests done and nothing was found, they told him he must have pulled something.  He says he still has the pain, says something is wrong with him.  Please advise on what he should do next.  I told him we will need the notes from the hospital, advised him that he will need to sign a release.

## 2012-05-20 NOTE — Telephone Encounter (Signed)
Please call to check on pt. 

## 2012-05-20 NOTE — Telephone Encounter (Signed)
Please request records so that I can review them.

## 2012-05-20 NOTE — Telephone Encounter (Signed)
Left message asking pt to call back. 

## 2012-05-26 ENCOUNTER — Other Ambulatory Visit: Payer: Self-pay | Admitting: *Deleted

## 2012-05-26 MED ORDER — ALPRAZOLAM 0.5 MG PO TABS: ORAL_TABLET | ORAL | Status: DC

## 2012-05-26 NOTE — Telephone Encounter (Signed)
Medicine called to pharmacy. 

## 2012-05-26 NOTE — Telephone Encounter (Signed)
Faxed request from [facility].

## 2012-05-26 NOTE — Telephone Encounter (Signed)
Pt has appt to see Dr Dayton Martes, wife is requesting records.

## 2012-05-27 ENCOUNTER — Encounter: Payer: Self-pay | Admitting: Family Medicine

## 2012-05-27 ENCOUNTER — Ambulatory Visit (INDEPENDENT_AMBULATORY_CARE_PROVIDER_SITE_OTHER): Payer: BC Managed Care – PPO | Admitting: Family Medicine

## 2012-05-27 VITALS — BP 100/80 | HR 72 | Temp 98.2°F | Wt 212.0 lb

## 2012-05-27 DIAGNOSIS — F1010 Alcohol abuse, uncomplicated: Secondary | ICD-10-CM

## 2012-05-27 DIAGNOSIS — R7401 Elevation of levels of liver transaminase levels: Secondary | ICD-10-CM

## 2012-05-27 DIAGNOSIS — R1011 Right upper quadrant pain: Secondary | ICD-10-CM

## 2012-05-27 MED ORDER — ALPRAZOLAM 0.5 MG PO TABS: ORAL_TABLET | ORAL | Status: DC

## 2012-05-27 MED ORDER — TRAMADOL HCL 50 MG PO TABS: 50.0000 mg | ORAL_TABLET | Freq: Three times a day (TID) | ORAL | Status: AC | PRN

## 2012-05-27 MED ORDER — HYDROCODONE-ACETAMINOPHEN 5-500 MG PO TABS: 1.0000 | ORAL_TABLET | Freq: Three times a day (TID) | ORAL | Status: AC | PRN

## 2012-05-27 NOTE — Patient Instructions (Addendum)
For rib fractures, it just take time to heal. You may take Tramadol as directed- every 8 hours during the day. Then, you may take a Vicodin as needed at night or when you're driving/working.

## 2012-05-27 NOTE — Progress Notes (Signed)
Subjective:    Patient ID: Jonathan [name], male    DOB: 01/27/1971, 41 y.o.   MRN: [id number]  HPI  41 yo here for ER follow up.  Had severe RUQ pain over weekend (7/8), went to [place] ER. 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 That evening, had severe pain that radiated to back with any movement, bending, sneezing, coughing, laughing.  No SOB.  No fever.  Notes reviewed (received them after pt's office visit) CT angio of chest neg except for right 7th rib fracture (healing).  ALT, AST, CK elevated- has known changes to liver consistent with alcohol use. Hgb 12.9 Plt 146 EKG and UA neg.  He is very anxious today because he was told nothing is wrong.  When his wife called the hospital, she was told of rib fracture so he is here today to discuss.  Still unable to get comfortable with any movement. Has actually been doing better overall- drinking less ETOH now that he has a job. Mood has been better, overall less anxious.  Patient Active Problem List  Diagnosis  . HYPERLIPIDEMIA  . ANXIETY  . ALCOHOL ABUSE  . HYPERTENSION  . INTRINSIC ASTHMA, UNSPECIFIED  . GERD  . SHORTNESS OF BREATH  . CHEST PAIN, ATYPICAL  . TRANSAMINASES, SERUM, ELEVATED  . Trigger finger  . Right upper quadrant pain   Past Medical History  Diagnosis Date  . Anxiety   . Hypertension    No past surgical history on file. History  Substance Use Topics  . Smoking status: Former Smoker    Quit date: 11/12/1993  . Smokeless tobacco: Not on file  . Alcohol Use:    Family History  Problem Relation Age of Onset  . Cancer Mother     breast  . Cancer Father     prostate   Allergies  Allergen Reactions  . Ace Inhibitors     Cough    Current Outpatient Prescriptions on File Prior to Visit  Medication Sig Dispense Refill  . losartan-hydrochlorothiazide (HYZAAR) 50-12.5 MG per tablet TAKE 1 TABLET BY MOUTH EVERY DAY  30 tablet  11  . Multiple  Vitamin (MULTIVITAMIN) tablet Take 1 tablet by mouth daily.      Marland Kitchen omeprazole (PRILOSEC) 40 MG capsule TAKE 1 CAPSULE (40 MG TOTAL) BY MOUTH EVERY MORNING.  30 capsule  3   The PMH, PSH, Social History, Family History, Medications, and allergies have been reviewed in [facility], and have been updated if relevant.     Review of Systems    See HPI No CP  No SOB Objective:   Physical Exam BP 100/80  Pulse 72  Temp 98.2 F (36.8 C)  Wt 212 lb (96.163 kg) General:  overweght male in NAD Eyes:  PERRL Ears:  External ear exam shows no significant lesions or deformities.  Otoscopic examination reveals clear canals, tympanic membranes are intact bilaterally without bulging, retraction, inflammation or discharge. Hearing is grossly normal bilaterally. Nose:  External nasal examination shows no deformity or inflammation. Nasal mucosa are pink and moist without lesions or exudates. Mouth:  Oral mucosa and oropharynx without lesions or exudates.  Teeth in good repair. Neck:  no carotid bruit or thyromegaly no cervical or supraclavicular lymphadenopathy  Lungs:  Normal respiratory effort, chest expands symmetrically. Lungs are clear to auscultation, no crackles or wheezes. Heart:  Normal rate and regular rhythm. S1 and S2 normal without gallop, murmur, click, rub or other extra  sounds. Abdomen:  Bowel sounds positive,abdomen soft and non-tender without masses, organomegaly or hernias noted. Pulses:  R and L posterior tibial pulses are full and equal bilaterally  MSK:  Pain reproduced with palpation over right rib cage, twisting toward right and deep inspirations. Extremities:  no edema       Assessment & Plan:   1. Right upper quadrant pain  New- consistent with rib fx seen on CT scan. Given vicodin and tramadol for pain (see pt instructions for details).  2. TRANSAMINASES, SERUM, ELEVATED  Unchanged- will call pt to come back in for repeat labs- CMET, CBC.  3. ALCOHOL ABUSE  Improving now that  he is working.

## 2012-05-27 NOTE — Addendum Note (Signed)
Addended by: Alvina Chou on: 05/27/2012 09:52 AM   Modules accepted: Orders

## 2012-06-20 ENCOUNTER — Other Ambulatory Visit: Payer: Self-pay

## 2012-06-20 NOTE — Telephone Encounter (Signed)
[facility] refill alprazolam 0.5 mg. Last filled 05/26/12.Please advise.

## 2012-06-20 NOTE — Telephone Encounter (Signed)
Okay #60 x 0 Change to 1 tid prn Let him know that he needs to slow down on these---they are very dependence provoking and he will get into trouble with this

## 2012-06-23 MED ORDER — ALPRAZOLAM 0.5 MG PO TABS: 0.5000 mg | ORAL_TABLET | Freq: Three times a day (TID) | ORAL | Status: DC | PRN

## 2012-06-23 NOTE — Telephone Encounter (Signed)
rx called into pharmacy LM on pt's VM there was a change in instructions and advised him to call if any questions.

## 2012-07-06 ENCOUNTER — Other Ambulatory Visit: Payer: Self-pay | Admitting: Family Medicine

## 2012-07-21 ENCOUNTER — Other Ambulatory Visit: Payer: Self-pay | Admitting: *Deleted

## 2012-07-21 MED ORDER — ALPRAZOLAM 0.5 MG PO TABS: 0.5000 mg | ORAL_TABLET | Freq: Three times a day (TID) | ORAL | Status: DC | PRN

## 2012-07-21 NOTE — Telephone Encounter (Signed)
Since directions say up to tid and he was given 60, I feel comfortable filling it in Dr Elmer Sow absence Px written for call in

## 2012-07-21 NOTE — Telephone Encounter (Signed)
Last filled 06/23/2012, Aron patient, too early?

## 2012-07-21 NOTE — Telephone Encounter (Signed)
Xanax 0.5 mg #60 [facility]R [facility]

## 2012-08-21 ENCOUNTER — Other Ambulatory Visit: Payer: Self-pay | Admitting: *Deleted

## 2012-08-21 MED ORDER — ALPRAZOLAM 0.5 MG PO TABS: 0.5000 mg | ORAL_TABLET | Freq: Three times a day (TID) | ORAL | Status: DC | PRN

## 2012-08-21 NOTE — Telephone Encounter (Signed)
This is Aron's pt- is it sent to me because she is not in today?

## 2012-08-21 NOTE — Telephone Encounter (Signed)
Ok to refill 

## 2012-08-21 NOTE — Telephone Encounter (Signed)
Medicine called to [facility].

## 2012-08-21 NOTE — Telephone Encounter (Signed)
This was a faxed refill request that had you as the prescriber, but I will send it to Dr. Dayton Martes

## 2012-09-19 ENCOUNTER — Other Ambulatory Visit: Payer: Self-pay | Admitting: *Deleted

## 2012-09-19 NOTE — Telephone Encounter (Signed)
Ok to refill 

## 2012-09-22 MED ORDER — ALPRAZOLAM 0.5 MG PO TABS: 0.5000 mg | ORAL_TABLET | Freq: Three times a day (TID) | ORAL | Status: DC | PRN

## 2012-09-22 NOTE — Telephone Encounter (Signed)
Rx called in as prescribed 

## 2012-10-20 ENCOUNTER — Other Ambulatory Visit: Payer: Self-pay | Admitting: *Deleted

## 2012-10-20 NOTE — Telephone Encounter (Signed)
Faxed refill request from [facility], last filled date 09/22/12.

## 2012-10-21 MED ORDER — ALPRAZOLAM 0.5 MG PO TABS: 0.5000 mg | ORAL_TABLET | Freq: Three times a day (TID) | ORAL | Status: DC | PRN

## 2012-10-21 NOTE — Telephone Encounter (Signed)
[facility] 

## 2012-11-13 ENCOUNTER — Other Ambulatory Visit: Payer: Self-pay | Admitting: *Deleted

## 2012-11-13 MED ORDER — ALPRAZOLAM 0.5 MG PO TABS: 0.5000 mg | ORAL_TABLET | Freq: Three times a day (TID) | ORAL | Status: DC | PRN

## 2012-11-13 NOTE — Telephone Encounter (Signed)
[facility] 

## 2012-11-13 NOTE — Telephone Encounter (Signed)
Ok to refill 

## 2012-11-14 ENCOUNTER — Other Ambulatory Visit: Payer: Self-pay | Admitting: *Deleted

## 2012-11-14 MED ORDER — ALPRAZOLAM 0.5 MG PO TABS: 0.5000 mg | ORAL_TABLET | Freq: Three times a day (TID) | ORAL | Status: DC | PRN

## 2012-11-14 NOTE — Telephone Encounter (Signed)
Duplicate request, this was sent in yesterday

## 2012-11-14 NOTE — Telephone Encounter (Signed)
Faxed refill request from [facility], last filled 10/21/12.

## 2012-12-12 ENCOUNTER — Other Ambulatory Visit: Payer: Self-pay | Admitting: *Deleted

## 2012-12-12 MED ORDER — ALPRAZOLAM 0.5 MG PO TABS: 0.5000 mg | ORAL_TABLET | Freq: Three times a day (TID) | ORAL | Status: DC | PRN

## 2012-12-12 NOTE — Telephone Encounter (Signed)
[facility] 

## 2012-12-12 NOTE — Telephone Encounter (Signed)
Faxed refill request from [facility], last filled 11/13/12.

## 2012-12-15 ENCOUNTER — Other Ambulatory Visit: Payer: Self-pay | Admitting: *Deleted

## 2013-01-19 ENCOUNTER — Other Ambulatory Visit: Payer: Self-pay | Admitting: *Deleted

## 2013-01-19 MED ORDER — ALPRAZOLAM 0.5 MG PO TABS: 0.5000 mg | ORAL_TABLET | Freq: Three times a day (TID) | ORAL | Status: DC | PRN

## 2013-01-19 NOTE — Telephone Encounter (Signed)
rx called to pharmacy 

## 2013-02-26 ENCOUNTER — Other Ambulatory Visit: Payer: Self-pay | Admitting: *Deleted

## 2013-02-26 MED ORDER — ALPRAZOLAM 0.5 MG PO TABS: 0.5000 mg | ORAL_TABLET | Freq: Three times a day (TID) | ORAL | Status: DC | PRN

## 2013-02-26 NOTE — Telephone Encounter (Signed)
Faxed refill request from [facility], last filled 01/19/13.

## 2013-02-26 NOTE — Telephone Encounter (Signed)
[facility] 

## 2013-03-12 ENCOUNTER — Other Ambulatory Visit: Payer: Self-pay | Admitting: *Deleted

## 2013-03-12 MED ORDER — OMEPRAZOLE 40 MG PO CPDR: DELAYED_RELEASE_CAPSULE | ORAL | Status: DC

## 2013-03-12 MED ORDER — LOSARTAN POTASSIUM-HCTZ 50-12.5 MG PO TABS: ORAL_TABLET | ORAL | Status: DC

## 2013-04-13 ENCOUNTER — Other Ambulatory Visit: Payer: Self-pay | Admitting: Family Medicine

## 2013-04-13 NOTE — Telephone Encounter (Signed)
[facility] 

## 2013-05-18 ENCOUNTER — Other Ambulatory Visit: Payer: Self-pay | Admitting: Family Medicine

## 2013-05-18 NOTE — Telephone Encounter (Signed)
[facility] 

## 2013-06-24 ENCOUNTER — Other Ambulatory Visit: Payer: Self-pay | Admitting: Family Medicine

## 2013-06-24 NOTE — Telephone Encounter (Signed)
I will defer to PCP as she is back tomorrow and pt hasn't been seen recently.

## 2013-06-29 ENCOUNTER — Encounter: Payer: Self-pay | Admitting: Radiology

## 2013-06-30 ENCOUNTER — Encounter: Payer: Self-pay | Admitting: Family Medicine

## 2013-06-30 ENCOUNTER — Ambulatory Visit (INDEPENDENT_AMBULATORY_CARE_PROVIDER_SITE_OTHER): Payer: BC Managed Care – PPO | Admitting: Family Medicine

## 2013-06-30 VITALS — BP 150/110 | HR 104 | Temp 98.9°F | Ht 69.0 in | Wt 203.0 lb

## 2013-06-30 DIAGNOSIS — I10 Essential (primary) hypertension: Secondary | ICD-10-CM

## 2013-06-30 DIAGNOSIS — F411 Generalized anxiety disorder: Secondary | ICD-10-CM

## 2013-06-30 MED ORDER — ALPRAZOLAM 0.5 MG PO TABS: ORAL_TABLET | ORAL | Status: DC

## 2013-06-30 MED ORDER — LOSARTAN POTASSIUM-HCTZ 50-12.5 MG PO TABS: ORAL_TABLET | ORAL | Status: DC

## 2013-06-30 NOTE — Progress Notes (Signed)
  42 yo who has missed several appointments here to refill his meds.  Anxiety-  Working at [employer] now.   Using his Xanax less frequently, usually twice a week.  Feels he does have better control over his anxiety now that he has more purpose.    HTN- had previously been stable on Hyzaar.  Has lost weight now that he is more active with his job and drinking less. Usually elevated in office due to anxiety. Has not taken his medication today. No CP or SOB.  Patient Active Problem List   Diagnosis Date Noted  . Right upper quadrant pain 05/27/2012  . Trigger finger 02/12/2012  . ALCOHOL ABUSE 09/13/2010  . TRANSAMINASES, SERUM, ELEVATED 09/13/2010  . HYPERLIPIDEMIA 12/16/2009  . GERD 10/14/2009  . INTRINSIC ASTHMA, UNSPECIFIED 09/09/2009  . CHEST PAIN, ATYPICAL 08/25/2009  . ANXIETY 08/12/2009  . HYPERTENSION 08/12/2009  . SHORTNESS OF BREATH 08/12/2009   Past Medical History  Diagnosis Date  . Anxiety   . Hypertension    No past surgical history on file. History  Substance Use Topics  . Smoking status: Former Smoker    Quit date: 11/12/1993  . Smokeless tobacco: Not on file  . Alcohol Use:    Family History  Problem Relation Age of Onset  . Cancer Mother     breast  . Cancer Father     prostate   Allergies  Allergen Reactions  . Ace Inhibitors     Cough    Current Outpatient Prescriptions on File Prior to Visit  Medication Sig Dispense Refill  . Multiple Vitamin (MULTIVITAMIN) tablet Take 1 tablet by mouth daily.      Marland Kitchen omeprazole (PRILOSEC) 40 MG capsule TAKE 1 CAPSULE (40 MG TOTAL) BY MOUTH EVERY MORNING. * Needs appointment with Dr for additional refills*  90 capsule  0   No current facility-administered medications on file prior to visit.   The PMH, PSH, Social History, Family History, Medications, and allergies have been reviewed in [facility], and have been updated if relevant.  Review of Systems  See HPI   Physical Exam  BP 150/110  Pulse 104   Temp(Src) 98.9 F (37.2 C)  Ht 5\' 9"  (1.753 m)  Wt 203 lb (92.08 kg)  BMI 29.96 kg/m2 Wt Readings from Last 3 Encounters:  06/30/13 203 lb (92.08 kg)  05/27/12 212 lb (96.163 kg)  02/12/12 216 lb (97.977 kg)     General: alert, obese, normal work of breathing, less anxious.  Abd:  Soft, NT, pos BS Neurologic: alert & oriented X3 and gait normal.  Skin: Intact without suspicious lesions or rashes  Psych: memory intact for recent and remote, normally interactive, and good eye contact, less anxious today.  Assessment and Plan:  1. ANXIETY Stable.  Rx refilled.  2. HYPERTENSION Elevated but tends to be high in office due to anxiety. Due for CPX.  He will schedule this on his way out today. No changes today- he is asymptomatic.

## 2013-06-30 NOTE — Patient Instructions (Addendum)
Good to see you. Please schedule a physical at your convenience.

## 2013-07-03 ENCOUNTER — Telehealth: Payer: Self-pay | Admitting: Family Medicine

## 2013-07-03 NOTE — Telephone Encounter (Signed)
Advised patient as instructed.  He said he would wait till Monday and if not any better would call back for appt.  Advised him it's best not to wait until Monday, that he should be checked at urgent care today or tomorrow.  Pt then said he would.

## 2013-07-03 NOTE — Telephone Encounter (Signed)
The patient called and wanted to get medical advice on what to do.  He stated he tripped in his driveway and is having rib pain each time he breaths in.  He is upset due to leaving several messages with call a nurse, but has not received a call back. Do you want him scheduled for an ov? Patient's call back is #[phone]  Thanks!

## 2013-07-03 NOTE — Telephone Encounter (Signed)
It's difficult for me to triage without seeing him.  Rib pain with deep breaths could be rib bruising or fracture.  If he is not short of breath, that is a good sign.  There is not much we can do for rib fractures but there is always concern that he could have punctured a lung.  Please call him to see how severe his symptoms are.  If he cannot come in now, then he can go to urgent care or schedule in weekend clinic tomorrow.

## 2013-07-03 NOTE — Telephone Encounter (Signed)
Left message on voice mail asking patient to call back. 

## 2013-07-04 ENCOUNTER — Other Ambulatory Visit: Payer: Self-pay | Admitting: Family Medicine

## 2013-07-09 ENCOUNTER — Telehealth: Payer: Self-pay

## 2013-07-09 MED ORDER — TRAMADOL HCL 50 MG PO TABS: 50.0000 mg | ORAL_TABLET | Freq: Three times a day (TID) | ORAL | Status: DC | PRN

## 2013-07-09 NOTE — Telephone Encounter (Signed)
On 07/02/13 pt fell on gravel and hit right side of chest. Seen Person [facility] in [place] on 07/08/13;did not do xrays and faxed request for medical records; pt said Meloxicam 7.5 mg given taking one daily is not helping pain in rt side of chest when moves, takes deep breath or coughs.pain level now laying down is 7. Pt said hard to get out of bed or chair without a lot of pain. Pt request pain med to [facility]. Pt request cb. Pt does not want to come for appt until hears Dr Dellie Catholic suggestion. Pt request cb.

## 2013-07-09 NOTE — Telephone Encounter (Signed)
Ok to send rx for Tramadol 50 mg- 1 tab po every 8 hours as needed for pain.  #20 with no refills.  Ok to continue Meloxicam with this.

## 2013-07-09 NOTE — Telephone Encounter (Signed)
Advised patient.  Script called to [facility].  Pt will call early on 9/2 for appt if he's not feeling any better.

## 2013-07-10 ENCOUNTER — Encounter: Payer: Self-pay | Admitting: Family Medicine

## 2013-08-10 ENCOUNTER — Other Ambulatory Visit: Payer: Self-pay | Admitting: Family Medicine

## 2013-08-10 NOTE — Telephone Encounter (Signed)
Rx called to pharmacy

## 2013-08-20 ENCOUNTER — Ambulatory Visit (INDEPENDENT_AMBULATORY_CARE_PROVIDER_SITE_OTHER): Payer: Federal, State, Local not specified - PPO | Admitting: Family Medicine

## 2013-08-20 ENCOUNTER — Telehealth: Payer: Self-pay

## 2013-08-20 ENCOUNTER — Encounter: Payer: Self-pay | Admitting: Family Medicine

## 2013-08-20 ENCOUNTER — Ambulatory Visit (INDEPENDENT_AMBULATORY_CARE_PROVIDER_SITE_OTHER)
Admission: RE | Admit: 2013-08-20 | Discharge: 2013-08-20 | Disposition: A | Discharge status: Home / Self Care | Payer: Federal, State, Local not specified - PPO | Source: Ambulatory Visit | Attending: Family Medicine | Admitting: Family Medicine

## 2013-08-20 VITALS — BP 140/100 | HR 93 | Temp 98.7°F | Ht 69.0 in | Wt 202.2 lb

## 2013-08-20 DIAGNOSIS — R52 Pain, unspecified: Secondary | ICD-10-CM

## 2013-08-20 DIAGNOSIS — R109 Unspecified abdominal pain: Secondary | ICD-10-CM | POA: Insufficient documentation

## 2013-08-20 LAB — POCT UA - MICROSCOPIC ONLY

## 2013-08-20 LAB — POCT URINALYSIS DIPSTICK
Glucose, UA: NEGATIVE
Leukocytes, UA: NEGATIVE
Nitrite, UA: NEGATIVE
Urobilinogen, UA: 0.2
pH, UA: 6

## 2013-08-20 MED ORDER — HYDROCODONE-ACETAMINOPHEN 5-500 MG PO TABS: 1.0000 | ORAL_TABLET | Freq: Four times a day (QID) | ORAL | Status: DC | PRN

## 2013-08-20 MED ORDER — HYDROCODONE-ACETAMINOPHEN 5-325 MG PO TABS: 1.0000 | ORAL_TABLET | Freq: Four times a day (QID) | ORAL | Status: DC | PRN

## 2013-08-20 NOTE — Assessment & Plan Note (Signed)
Trace blood in urine, no infection.  mild dehydration.  Concern for kidney stone.. Send for CT abd pelvis non contrast.  Hydrocodone for pain as tramadol ineffective.

## 2013-08-20 NOTE — Patient Instructions (Addendum)
Take BP med immediately when you get home.  Stop at front desk for scheduling CT scan.  Push fluids like water. Can use hydrocodone for pain for pain.  Go to ER if pain worsens or cannot tolerate liquids.

## 2013-08-20 NOTE — Telephone Encounter (Signed)
Change to 5/325 mg... I only chose 5/500 because it looked like insurance would cover better.

## 2013-08-20 NOTE — Telephone Encounter (Signed)
New Rx written for Hydrocodone-Acetaminophen 5-325 mg.  Barbara Cower notified prescription is ready to picked up at front desk.  As of 08/17/2013, we no longer can call it in to pharmacy.

## 2013-08-20 NOTE — Telephone Encounter (Signed)
[facility] left v/m; Hydrocodone apap 5-500 is no longer available; request cb with different strength.Please advise.

## 2013-08-20 NOTE — Progress Notes (Signed)
  Subjective:    Patient ID: Jonathan [name], male    DOB: May 03, 1971, 42 y.o.   MRN: [id number]  HPI  42 year old male presenting  New onset pain in left flank in last week. Pain only occurs with rolling, twisting, coughing... Has sharp pain lasts a second. 9-10/10 on pain scale. Pain with deep breath. No shortness of breath. Has chronic post nasal drip that he uses cough drops for. No chest pain.  No dysuria, No flow, No blood in urine.   Hx of HTN. High today because he did not take losartan HCTZ.    Review of Systems  Constitutional: Negative for fever and fatigue.  HENT: Negative for ear pain.   Eyes: Negative for pain.  Respiratory: Negative for cough and shortness of breath.   Cardiovascular: Negative for chest pain and leg swelling.  Gastrointestinal: Negative for abdominal pain, diarrhea and constipation.  Genitourinary: Negative for hematuria.  Skin: Negative for rash.       Objective:   Physical Exam  Constitutional: He appears well-developed and well-nourished.  Pt very shaky and jittery.. Says it is his nerves  Eyes: Pupils are equal, round, and reactive to light.  Neck: Normal range of motion. Neck supple.  Cardiovascular: Normal rate.   Pulmonary/Chest: Effort normal and breath sounds normal.  Abdominal: Soft. Bowel sounds are normal. There is no hepatosplenomegaly. There is tenderness. There is CVA tenderness.  Pain in left flank          Assessment & Plan:

## 2013-09-28 ENCOUNTER — Other Ambulatory Visit: Payer: Self-pay | Admitting: Family Medicine

## 2013-09-28 NOTE — Telephone Encounter (Signed)
Called to [facility]. 

## 2013-09-28 NOTE — Telephone Encounter (Signed)
Last office visit 08/20/2013 with Dr. Ermalene Searing.  Ok to refill?

## 2013-09-28 NOTE — Telephone Encounter (Signed)
Ok to phone in.

## 2013-11-15 ENCOUNTER — Other Ambulatory Visit: Payer: Self-pay | Admitting: Family Medicine

## 2013-11-16 NOTE — Telephone Encounter (Signed)
Spoke to pt and informed him that Rx is available for pickup at requested pharmacy

## 2013-11-16 NOTE — Telephone Encounter (Signed)
Pt requesting medication refill. Last ov 06/30/13 with no future appt scheduled. pls advise.

## 2013-12-22 ENCOUNTER — Other Ambulatory Visit: Payer: Self-pay | Admitting: Family Medicine

## 2013-12-22 NOTE — Telephone Encounter (Signed)
Pt requesting medication refill. Last ov 05/2013 with no future appts scheduled. pls advise 

## 2013-12-23 NOTE — Telephone Encounter (Signed)
Lm on pts vm informing him Rx sent to requested pharmacy.

## 2014-01-25 ENCOUNTER — Other Ambulatory Visit: Payer: Self-pay | Admitting: Family Medicine

## 2014-01-25 NOTE — Telephone Encounter (Signed)
Pt requesting medication refill. Last ov 06/2013 with no future appts scheduled. pls advise 

## 2014-01-26 NOTE — Telephone Encounter (Signed)
Lm on pts vm informing him Rx has been faxed to requested pharmacy 

## 2014-02-03 ENCOUNTER — Other Ambulatory Visit: Payer: Self-pay | Admitting: Family Medicine

## 2014-02-03 ENCOUNTER — Telehealth: Payer: Self-pay | Admitting: Family Medicine

## 2014-02-03 NOTE — Telephone Encounter (Signed)
Pt is needing refill on Alprazolam. Please advise pt is almost out of medication

## 2014-02-03 NOTE — Telephone Encounter (Signed)
Spoke to pt and informed him Rx was faxed to pharmacy 01/26/14. Spoke to pharmacy who states that it was not received; original Rx pulled from file and re-faxed

## 2014-02-07 ENCOUNTER — Other Ambulatory Visit: Payer: Self-pay | Admitting: Family Medicine

## 2014-02-09 ENCOUNTER — Other Ambulatory Visit: Payer: Self-pay

## 2014-02-09 MED ORDER — LOSARTAN POTASSIUM-HCTZ 50-12.5 MG PO TABS: ORAL_TABLET | ORAL | Status: DC

## 2014-02-09 NOTE — Telephone Encounter (Signed)
Pt had to cancel appt with Dr Deborra Medina for 02/10/14; pt is checking in to [place] at 6 pm for possible abuse of beer. Pt wants help before he gets out of control. Pt is out of losartan-HCTZ and request refill to [facility]. Pt request cb when refill done.

## 2014-02-10 ENCOUNTER — Ambulatory Visit: Payer: Federal, State, Local not specified - PPO | Admitting: Family Medicine

## 2014-02-23 LAB — CBC AND DIFFERENTIAL
HEMATOCRIT: 47 % (ref 41–53)
Hemoglobin: 16.5 g/dL (ref 13.5–17.5)

## 2014-02-23 LAB — LIPID PANEL
Cholesterol: 158 mg/dL (ref 0–200)
HDL: 39 mg/dL (ref 35–70)
LDL Cholesterol: 99 mg/dL
TRIGLYCERIDES: 101 mg/dL (ref 40–160)

## 2014-02-23 LAB — HEPATIC FUNCTION PANEL
ALT: 56 U/L — AB (ref 10–40)
AST: 25 U/L (ref 14–40)
Alkaline Phosphatase: 62 U/L (ref 25–125)
Bilirubin, Total: 0.4 mg/dL

## 2014-03-11 ENCOUNTER — Ambulatory Visit: Payer: Federal, State, Local not specified - PPO | Admitting: Family Medicine

## 2014-03-12 ENCOUNTER — Ambulatory Visit (INDEPENDENT_AMBULATORY_CARE_PROVIDER_SITE_OTHER): Payer: Federal, State, Local not specified - PPO | Admitting: Family Medicine

## 2014-03-12 ENCOUNTER — Encounter: Payer: Self-pay | Admitting: Family Medicine

## 2014-03-12 VITALS — BP 126/72 | HR 101 | Temp 98.1°F | Ht 68.0 in | Wt 204.0 lb

## 2014-03-12 DIAGNOSIS — F411 Generalized anxiety disorder: Secondary | ICD-10-CM

## 2014-03-12 DIAGNOSIS — R7401 Elevation of levels of liver transaminase levels: Secondary | ICD-10-CM

## 2014-03-12 DIAGNOSIS — F1020 Alcohol dependence, uncomplicated: Secondary | ICD-10-CM

## 2014-03-12 DIAGNOSIS — E785 Hyperlipidemia, unspecified: Secondary | ICD-10-CM

## 2014-03-12 DIAGNOSIS — R740 Nonspecific elevation of levels of transaminase and lactic acid dehydrogenase [LDH]: Secondary | ICD-10-CM

## 2014-03-12 DIAGNOSIS — R7402 Elevation of levels of lactic acid dehydrogenase (LDH): Secondary | ICD-10-CM

## 2014-03-12 MED ORDER — OMEPRAZOLE 40 MG PO CPDR: DELAYED_RELEASE_CAPSULE | ORAL | Status: DC

## 2014-03-12 NOTE — Assessment & Plan Note (Signed)
Much improved without ETOH!

## 2014-03-12 NOTE — Progress Notes (Signed)
Subjective:   Patient ID: Jonathan [name], male    DOB: 26-Nov-1970, 43 y.o.   MRN: [id number]  Jonathan [name] is a pleasant 42 y.o. year old male who presents to clinic today with Follow-up and Medication Refill  on 03/12/2014  HPI: Jonathan [name] to [facility] for alcohol addiction for 28 days.  Got home 3 days ago. Has been clean and sober for 31 days.  Feels so much better.  Liver function and cholesterol now normal!  He brings in labs from [facility] today. Lab Results  Component Value Date   ALT 56* 02/23/2014   AST 25 02/23/2014   ALKPHOS 62 02/23/2014   BILITOT 1.0 01/23/2012   Lab Results  Component Value Date   CHOL 158 02/23/2014   HDL 39 02/23/2014   LDLCALC 99 02/23/2014   LDLDIRECT 170.6 01/23/2012   TRIG 101 02/23/2014   CHOLHDL 5 01/23/2012   Made a lot of friends at [employer].  Five of them live locally.  Attending AA regularly and wife is very supportive.  Started on Seroquel but he thought it was a sleeping pill so he has not been taking it.  Has been feeling a little depressed and anxious since he got home.  "I have to figure out who Jonathan [name] is since I haven't known him for 5 years."  Denies any SI or HI.  Patient Active Problem List   Diagnosis Date Noted  . Acute left flank pain 08/20/2013  . Right upper quadrant pain 05/27/2012  . Trigger finger 02/12/2012  . Alcohol dependence 09/13/2010  . TRANSAMINASES, SERUM, ELEVATED 09/13/2010  . HYPERLIPIDEMIA 12/16/2009  . GERD 10/14/2009  . INTRINSIC ASTHMA, UNSPECIFIED 09/09/2009  . CHEST PAIN, ATYPICAL 08/25/2009  . ANXIETY 08/12/2009  . HYPERTENSION 08/12/2009  . SHORTNESS OF BREATH 08/12/2009   Past Medical History  Diagnosis Date  . Anxiety   . Hypertension    No past surgical history on file. History  Substance Use Topics  . Smoking status: Former Smoker    Quit date: 11/12/1993  . Smokeless tobacco: Not on file  . Alcohol Use:    Family History  Problem Relation Age of Onset  . Cancer  Mother     breast  . Cancer Father     prostate   Allergies  Allergen Reactions  . Ace Inhibitors     Cough    Current Outpatient Prescriptions on File Prior to Visit  Medication Sig Dispense Refill  . losartan-hydrochlorothiazide (HYZAAR) 50-12.5 MG per tablet TAKE 1 TABLET BY MOUTH EVERY DAY  90 tablet  1  . Multiple Vitamin (MULTIVITAMIN) tablet Take 1 tablet by mouth daily.      . traMADol (ULTRAM) 50 MG tablet Take 1 tablet (50 mg total) by mouth every 8 (eight) hours as needed for pain.  20 tablet  0   No current facility-administered medications on file prior to visit.   The PMH, PSH, Social History, Family History, Medications, and allergies have been reviewed in [facility], and have been updated if relevant.   Review of Systems    See HPI No n/v/d No shakes No craving Objective:    BP 126/72  Pulse 101  Temp(Src) 98.1 F (36.7 C) (Oral)  Ht 5\' 8"  (1.727 m)  Wt 204 lb (92.534 kg)  BMI 31.03 kg/m2  SpO2 93%   Physical Exam Gen:  Alert, pleasant, NAD Psych:  Good eye contact Not anxious or depressed appearing.       Assessment & Plan:  HYPERLIPIDEMIA  TRANSAMINASES, SERUM, ELEVATED Return in about 1 month (around 04/12/2014) for a complete physical..

## 2014-03-12 NOTE — Progress Notes (Signed)
Pre visit review using our clinic review tool, if applicable. No additional management support is needed unless otherwise documented below in the visit note. 

## 2014-03-12 NOTE — Assessment & Plan Note (Signed)
He was not aware that he should be taking seroquel regularly. Advised taking nightly. Follow up in 1 month.

## 2014-03-12 NOTE — Assessment & Plan Note (Signed)
Very proud of him. We talked about the disease of alcoholism and possibility for relapse.  He will continue to go to meetings and talk to his sponsor.

## 2014-03-12 NOTE — Assessment & Plan Note (Signed)
Much improved without ETOH! No rx.

## 2014-03-12 NOTE — Patient Instructions (Addendum)
Please take your seroquel every night.  I am so proud of you.  Keep going to AA>

## 2014-04-12 ENCOUNTER — Encounter: Payer: Self-pay | Admitting: Family Medicine

## 2014-04-12 ENCOUNTER — Ambulatory Visit (INDEPENDENT_AMBULATORY_CARE_PROVIDER_SITE_OTHER): Payer: Federal, State, Local not specified - PPO | Admitting: Family Medicine

## 2014-04-12 VITALS — BP 132/62 | HR 80 | Temp 98.0°F | Ht 67.75 in | Wt 205.5 lb

## 2014-04-12 DIAGNOSIS — F1020 Alcohol dependence, uncomplicated: Secondary | ICD-10-CM

## 2014-04-12 DIAGNOSIS — F411 Generalized anxiety disorder: Secondary | ICD-10-CM

## 2014-04-12 DIAGNOSIS — M549 Dorsalgia, unspecified: Secondary | ICD-10-CM

## 2014-04-12 DIAGNOSIS — Z136 Encounter for screening for cardiovascular disorders: Secondary | ICD-10-CM

## 2014-04-12 DIAGNOSIS — Z0000 Encounter for general adult medical examination without abnormal findings: Secondary | ICD-10-CM | POA: Insufficient documentation

## 2014-04-12 MED ORDER — ALPRAZOLAM 0.5 MG PO TABS: ORAL_TABLET | ORAL | Status: DC

## 2014-04-12 MED ORDER — CYCLOBENZAPRINE HCL 10 MG PO TABS: 10.0000 mg | ORAL_TABLET | Freq: Every evening | ORAL | Status: DC | PRN

## 2014-04-12 MED ORDER — CITALOPRAM HYDROBROMIDE 20 MG PO TABS: 20.0000 mg | ORAL_TABLET | Freq: Every day | ORAL | Status: DC

## 2014-04-12 NOTE — Assessment & Plan Note (Signed)
Spasm. Prn flexeril with caution.

## 2014-04-12 NOTE — Assessment & Plan Note (Signed)
Stopped seroquel. Will start celexa 20 mg daily. Follow up in 1 month.

## 2014-04-12 NOTE — Progress Notes (Signed)
Pre visit review using our clinic review tool, if applicable. No additional management support is needed unless otherwise documented below in the visit note. 

## 2014-04-12 NOTE — Patient Instructions (Addendum)
We are starting celexa 20 mg daily. Please make an appt with AA.  Aleve 2 tabs twice a day with food OR ibuprofen 3 tabs three times a day with food for pain and inflammation. Can take tylenol in addition to this.  Try muscle relaxant at bedtime.

## 2014-04-12 NOTE — Assessment & Plan Note (Signed)
Reviewed preventive care protocols, scheduled due services, and updated immunizations Discussed nutrition, exercise, diet, and healthy lifestyle. Orders Placed This Encounter  Procedures  . CBC with Differential  . Comprehensive metabolic panel  . Lipid panel    

## 2014-04-12 NOTE — Progress Notes (Signed)
Subjective:   Patient ID: Jonathan [name], male    DOB: November 10, 1971, 43 y.o.   MRN: [id number]  Jonathan [name] is a pleasant 43 y.o. year old male who presents to clinic today with Annual Exam and Back Pain  on 04/12/2014  HPI: Here for CPX.  Went to [facility] for alcohol addiction last month for 28 days. Has been clean and sober for 61 days.  Feels so much better. Has not attended [place] meeting yet.  Stopped taking Seroquel and Hyzaar because he was feeling light headed and legs were tingling.  Felt seroquel actually made his "whole body tingling."  Some of tingling is better, back still hurts. Lab Results  Component Value Date   ALT 56* 02/23/2014   AST 25 02/23/2014   ALKPHOS 62 02/23/2014   BILITOT 1.0 01/23/2012   Lab Results  Component Value Date   CHOL 158 02/23/2014   HDL 39 02/23/2014   LDLCALC 99 02/23/2014   LDLDIRECT 170.6 01/23/2012   TRIG 101 02/23/2014   CHOLHDL 5 01/23/2012    Patient Active Problem List   Diagnosis Date Noted  . Routine general medical examination at a health care facility 04/12/2014  . Acute left flank pain 08/20/2013  . Right upper quadrant pain 05/27/2012  . Trigger finger 02/12/2012  . Alcohol dependence 09/13/2010  . TRANSAMINASES, SERUM, ELEVATED 09/13/2010  . HYPERLIPIDEMIA 12/16/2009  . GERD 10/14/2009  . INTRINSIC ASTHMA, UNSPECIFIED 09/09/2009  . CHEST PAIN, ATYPICAL 08/25/2009  . ANXIETY 08/12/2009  . HYPERTENSION 08/12/2009  . SHORTNESS OF BREATH 08/12/2009   Past Medical History  Diagnosis Date  . Anxiety   . Hypertension    No past surgical history on file. History  Substance Use Topics  . Smoking status: Former Smoker    Quit date: 11/12/1993  . Smokeless tobacco: Not on file  . Alcohol Use:    Family History  Problem Relation Age of Onset  . Cancer Mother     breast  . Cancer Father     prostate   Allergies  Allergen Reactions  . Ace Inhibitors     Cough    Current Outpatient Prescriptions on File  Prior to Visit  Medication Sig Dispense Refill  . losartan-hydrochlorothiazide (HYZAAR) 50-12.5 MG per tablet TAKE 1 TABLET BY MOUTH EVERY DAY  90 tablet  1  . Multiple Vitamin (MULTIVITAMIN) tablet Take 1 tablet by mouth daily.      . Multiple Vitamins-Minerals (THERATRUM COMPLETE PO) Take by mouth.      Marland Kitchen omeprazole (PRILOSEC) 40 MG capsule Take one capsule by mouth daily  90 capsule  3  . QUEtiapine (SEROQUEL) 50 MG tablet Take 50 mg by mouth at bedtime.       No current facility-administered medications on file prior to visit.   The PMH, PSH, Social History, Family History, Medications, and allergies have been reviewed in [facility], and have been updated if relevant.   Review of Systems    See HPI +anxiety +depression No SI or HI  Objective:    BP 132/62  Pulse 80  Temp(Src) 98 F (36.7 C) (Oral)  Ht 5' 7.75" (1.721 m)  Wt 205 lb 8 oz (93.214 kg)  BMI 31.47 kg/m2  SpO2 96%   Physical Exam General:  overweght male in NAD Eyes:  PERRL Ears:  External ear exam shows no significant lesions or deformities.  Otoscopic examination reveals clear canals, tympanic membranes are intact bilaterally without bulging, retraction, inflammation or discharge. Hearing is grossly normal  bilaterally. Nose:  External nasal examination shows no deformity or inflammation. Nasal mucosa are pink and moist without lesions or exudates. Mouth:  Oral mucosa and oropharynx without lesions or exudates.  Teeth in good repair. Neck:  no carotid bruit or thyromegaly no cervical or supraclavicular lymphadenopathy  Lungs:  Normal respiratory effort, chest expands symmetrically. Lungs are clear to auscultation, no crackles or wheezes. Heart:  Normal rate and regular rhythm. S1 and S2 normal without gallop, murmur, click, rub or other extra sounds. Abdomen:  Bowel sounds positive,abdomen soft and non-tender without masses, organomegaly or hernias noted. Pulses:  R and L posterior tibial pulses are full and equal  bilaterally  Extremities:  no edema  MSK:  No TTP over spine, does have some spasm paraspinous (lumbar)     Assessment & Plan:   Routine general medical examination at a health care facility  Alcohol dependence No Follow-up on file.

## 2014-04-12 NOTE — Assessment & Plan Note (Signed)
61 days sober! I did encourage him to go to [place] meeting.

## 2014-04-13 LAB — COMPREHENSIVE METABOLIC PANEL
ALK PHOS: 54 U/L (ref 39–117)
ALT: 30 U/L (ref 0–53)
AST: 24 U/L (ref 0–37)
Albumin: 4.4 g/dL (ref 3.5–5.2)
BUN: 13 mg/dL (ref 6–23)
CALCIUM: 9.3 mg/dL (ref 8.4–10.5)
CHLORIDE: 104 meq/L (ref 96–112)
CO2: 27 mEq/L (ref 19–32)
CREATININE: 0.9 mg/dL (ref 0.4–1.5)
GFR: 93.07 mL/min (ref >60.00)
Glucose, Bld: 99 mg/dL (ref 70–99)
Potassium: 3.8 mEq/L (ref 3.5–5.1)
Sodium: 138 mEq/L (ref 135–145)
Total Bilirubin: 0.6 mg/dL (ref 0.2–1.2)
Total Protein: 6.8 g/dL (ref 6.0–8.3)

## 2014-04-13 LAB — CBC WITH DIFFERENTIAL/PLATELET
BASOS ABS: 0 10*3/uL (ref 0.0–0.1)
BASOS PCT: 0.5 % (ref 0.0–3.0)
EOS ABS: 0.2 10*3/uL (ref 0.0–0.7)
Eosinophils Relative: 2.5 % (ref 0.0–5.0)
HCT: 45.4 % (ref 39.0–52.0)
HEMOGLOBIN: 15.2 g/dL (ref 13.0–17.0)
LYMPHS PCT: 27.7 % (ref 12.0–46.0)
Lymphs Abs: 2.4 10*3/uL (ref 0.7–4.0)
MCHC: 33.5 g/dL (ref 30.0–36.0)
MCV: 90.1 fl (ref 78.0–100.0)
Monocytes Absolute: 0.8 10*3/uL (ref 0.1–1.0)
Monocytes Relative: 9.1 % (ref 3.0–12.0)
Neutro Abs: 5.3 10*3/uL (ref 1.4–7.7)
Neutrophils Relative %: 60.2 % (ref 43.0–77.0)
Platelets: 186 10*3/uL (ref 150.0–400.0)
RBC: 5.03 Mil/uL (ref 4.22–5.81)
RDW: 11.8 % (ref 11.5–15.5)
WBC: 8.8 10*3/uL (ref 4.0–10.5)

## 2014-04-13 LAB — LIPID PANEL
Cholesterol: 158 mg/dL (ref 0–200)
HDL: 32.3 mg/dL — ABNORMAL LOW (ref >39.00)
LDL Cholesterol: 82 mg/dL (ref 0–99)
TRIGLYCERIDES: 221 mg/dL — AB (ref 0.0–149.0)
Total CHOL/HDL Ratio: 5
VLDL: 44.2 mg/dL — ABNORMAL HIGH (ref 0.0–40.0)

## 2014-04-14 ENCOUNTER — Encounter: Payer: Self-pay | Admitting: *Deleted

## 2014-06-15 ENCOUNTER — Other Ambulatory Visit: Payer: Self-pay | Admitting: Family Medicine

## 2014-06-15 NOTE — Telephone Encounter (Signed)
Pt requesting medication refill. Last f/u appt 04/2014-CPE with no future appts scheduled. pls advise

## 2014-06-15 NOTE — Telephone Encounter (Signed)
Rx called in to requested pharmacy 

## 2014-08-05 ENCOUNTER — Other Ambulatory Visit: Payer: Self-pay | Admitting: *Deleted

## 2014-08-05 MED ORDER — CITALOPRAM HYDROBROMIDE 20 MG PO TABS: 20.0000 mg | ORAL_TABLET | Freq: Every day | ORAL | Status: DC

## 2014-09-14 ENCOUNTER — Telehealth: Payer: Self-pay | Admitting: *Deleted

## 2014-09-14 NOTE — Telephone Encounter (Signed)
Fax received requesting medication refill. Request for losartan -hctz 50-12.5mg . Pt chart indicates med was d/c at last visit per Dr Deborra Medina. LM on pts vm requesting a call back

## 2014-09-15 ENCOUNTER — Telehealth: Payer: Self-pay

## 2014-09-15 NOTE — Telephone Encounter (Signed)
Patient left voicemail stating that someone called from the office and he was just returning the call. Patient believes the call was about an appointment with Dr. Deborra Medina.

## 2014-09-15 NOTE — Telephone Encounter (Signed)
See 09/14/14 phone note.

## 2014-09-15 NOTE — Telephone Encounter (Signed)
Spoke to pt who confirms that he is still taking losartan. Pts last OV indicates med was d/c. Should med be refilled? pls advise

## 2014-09-15 NOTE — Telephone Encounter (Signed)
Ok to refill if he has been taking it.  Make sure he comes to see me or a nurse visit in a couple of weeks or for blood pressure recheck if he is not checking his BP elsewhere.

## 2014-09-16 MED ORDER — LOSARTAN POTASSIUM-HCTZ 50-12.5 MG PO TABS: 1.0000 | ORAL_TABLET | Freq: Every day | ORAL | Status: DC

## 2014-09-16 NOTE — Telephone Encounter (Signed)
Lm on pts vm and informed him Rx sent to requested pharmacy

## 2014-09-16 NOTE — Addendum Note (Signed)
Addended by: Modena Nunnery on: 09/16/2014 03:56 PM   Modules accepted: Orders

## 2014-09-27 ENCOUNTER — Ambulatory Visit (INDEPENDENT_AMBULATORY_CARE_PROVIDER_SITE_OTHER): Payer: Federal, State, Local not specified - PPO | Admitting: Family Medicine

## 2014-09-27 ENCOUNTER — Encounter: Payer: Self-pay | Admitting: Family Medicine

## 2014-09-27 VITALS — BP 114/70 | HR 76 | Temp 98.2°F | Wt 223.0 lb

## 2014-09-27 DIAGNOSIS — F411 Generalized anxiety disorder: Secondary | ICD-10-CM

## 2014-09-27 DIAGNOSIS — E785 Hyperlipidemia, unspecified: Secondary | ICD-10-CM

## 2014-09-27 DIAGNOSIS — F1021 Alcohol dependence, in remission: Secondary | ICD-10-CM

## 2014-09-27 DIAGNOSIS — G2581 Restless legs syndrome: Secondary | ICD-10-CM

## 2014-09-27 DIAGNOSIS — I10 Essential (primary) hypertension: Secondary | ICD-10-CM

## 2014-09-27 LAB — CBC WITH DIFFERENTIAL/PLATELET
Basophils Absolute: 0 10*3/uL (ref 0.0–0.1)
Basophils Relative: 0.3 % (ref 0.0–3.0)
EOS PCT: 2.6 % (ref 0.0–5.0)
Eosinophils Absolute: 0.2 10*3/uL (ref 0.0–0.7)
HEMATOCRIT: 46.1 % (ref 39.0–52.0)
Hemoglobin: 15.3 g/dL (ref 13.0–17.0)
LYMPHS ABS: 2.4 10*3/uL (ref 0.7–4.0)
Lymphocytes Relative: 26.6 % (ref 12.0–46.0)
MCHC: 33.3 g/dL (ref 30.0–36.0)
MCV: 84.9 fl (ref 78.0–100.0)
MONO ABS: 0.6 10*3/uL (ref 0.1–1.0)
Monocytes Relative: 7.3 % (ref 3.0–12.0)
Neutro Abs: 5.6 10*3/uL (ref 1.4–7.7)
Neutrophils Relative %: 63.2 % (ref 43.0–77.0)
Platelets: 190 10*3/uL (ref 150.0–400.0)
RBC: 5.42 Mil/uL (ref 4.22–5.81)
RDW: 12.8 % (ref 11.5–15.5)
WBC: 8.9 10*3/uL (ref 4.0–10.5)

## 2014-09-27 MED ORDER — CYCLOBENZAPRINE HCL 10 MG PO TABS: 10.0000 mg | ORAL_TABLET | Freq: Every evening | ORAL | Status: DC | PRN

## 2014-09-27 MED ORDER — CITALOPRAM HYDROBROMIDE 40 MG PO TABS: 40.0000 mg | ORAL_TABLET | Freq: Every day | ORAL | Status: DC

## 2014-09-27 NOTE — Patient Instructions (Signed)
It was great to see you. We will call you with your lab results.  We are increasing your celexa (citalopram) to 40 mg daily.

## 2014-09-27 NOTE — Progress Notes (Signed)
Subjective:   Patient ID: Jonathan [name], male    DOB: 12/11/1970, 43 y.o.   MRN: [id number]  Jonathan [name] is a pleasant 43 y.o. year old male who presents to clinic today with Follow-up and restless leg  on 09/27/2014  HPI:  [name] to [place] for alcohol addiction this past summer. Has been clean and sober for 7 months. Feels so much better. Has not attended [place] meeting yet.  Stopped taking Seroquel because he was feeling light headed and legs were tingling.  Felt seroquel actually made his "whole body tingling."  Started Celexa 20 mg daily in 04/2014.  Felt like Celexa was working well until recently.  Now that he is sober, getting more anxious.  Overall, doing well.  Denies feeling depressed.  Sometimes gets the urge to drink but has not.  HTN- has been controlled on current dose of Hyzaar. Denies HA, blurred vision, CP or SOB.  ?RLS- at night, feels like his legs "cant stop moving."  It seems better without Seroquel but he still experiences this intermittently.   Patient Active Problem List   Diagnosis Date Noted  . RLS (restless legs syndrome) 09/27/2014  . Trigger finger 02/12/2012  . Alcohol dependence 09/13/2010  . TRANSAMINASES, SERUM, ELEVATED 09/13/2010  . HLD (hyperlipidemia) 12/16/2009  . GERD 10/14/2009  . INTRINSIC ASTHMA, UNSPECIFIED 09/09/2009  . Anxiety state 08/12/2009  . HTN (hypertension) 08/12/2009   Past Medical History  Diagnosis Date  . Anxiety   . Hypertension    No past surgical history on file. History  Substance Use Topics  . Smoking status: Former Smoker    Quit date: 11/12/1993  . Smokeless tobacco: Not on file  . Alcohol Use: Not on file   Family History  Problem Relation Age of Onset  . Cancer Mother     breast  . Cancer Father     prostate   Allergies  Allergen Reactions  . Ace Inhibitors     Cough    Current Outpatient Prescriptions on File Prior to Visit  Medication Sig Dispense Refill  .  losartan-hydrochlorothiazide (HYZAAR) 50-12.5 MG per tablet Take 1 tablet by mouth daily. 90 tablet 1  . Multiple Vitamin (MULTIVITAMIN) tablet Take 1 tablet by mouth daily.    . Multiple Vitamins-Minerals (THERATRUM COMPLETE PO) Take by mouth.    Marland Kitchen omeprazole (PRILOSEC) 40 MG capsule Take one capsule by mouth daily 90 capsule 3   No current facility-administered medications on file prior to visit.   The PMH, PSH, Social History, Family History, Medications, and allergies have been reviewed in [facility], and have been updated if relevant.   Review of Systems    See HPI +anxiety Sleeping ok No SI or HI  Objective:    BP 114/70 mmHg  Pulse 76  Temp(Src) 98.2 F (36.8 C) (Oral)  Wt 223 lb (101.152 kg)  SpO2 94% Wt Readings from Last 3 Encounters:  09/27/14 223 lb (101.152 kg)  04/12/14 205 lb 8 oz (93.214 kg)  03/12/14 204 lb (92.534 kg)     Physical Exam General:  overweght male in NAD Eyes:  PERRL Ears:  External ear exam shows no significant lesions or deformities.  Otoscopic examination reveals clear canals, tympanic membranes are intact bilaterally without bulging, retraction, inflammation or discharge. Hearing is grossly normal bilaterally. Nose:  External nasal examination shows no deformity or inflammation. Nasal mucosa are pink and moist without lesions or exudates. Mouth:  Oral mucosa and oropharynx without lesions or exudates.  Teeth in  good repair. Neck:  no carotid bruit or thyromegaly no cervical or supraclavicular lymphadenopathy  Lungs:  Normal respiratory effort, chest expands symmetrically. Lungs are clear to auscultation, no crackles or wheezes. Heart:  Normal rate and regular rhythm. S1 and S2 normal without gallop, murmur, click, rub or other extra sounds. Abdomen:  Bowel sounds positive,abdomen soft and non-tender without masses, organomegaly or hernias noted. Pulses:  R and L posterior tibial pulses are full and equal bilaterally  Extremities:  no edema        Assessment & Plan:   RLS (restless legs syndrome) - Plan: Basic metabolic panel, TSH, CBC with Differential  Anxiety state  HLD (hyperlipidemia) - Plan: Lipid panel  Essential hypertension No Follow-up on file.

## 2014-09-27 NOTE — Progress Notes (Signed)
Pre visit review using our clinic review tool, if applicable. No additional management support is needed unless otherwise documented below in the visit note. 

## 2014-09-28 ENCOUNTER — Telehealth: Payer: Self-pay | Admitting: Family Medicine

## 2014-09-28 LAB — LIPID PANEL
CHOL/HDL RATIO: 6
Cholesterol: 194 mg/dL (ref 0–200)
HDL: 34.9 mg/dL — ABNORMAL LOW (ref >39.00)
NonHDL: 159.1
Triglycerides: 252 mg/dL — ABNORMAL HIGH (ref 0.0–149.0)
VLDL: 50.4 mg/dL — ABNORMAL HIGH (ref 0.0–40.0)

## 2014-09-28 LAB — TSH: TSH: 4.97 u[IU]/mL — ABNORMAL HIGH (ref 0.35–4.50)

## 2014-09-28 LAB — BASIC METABOLIC PANEL
BUN: 20 mg/dL (ref 6–23)
CHLORIDE: 110 meq/L (ref 96–112)
CO2: 21 meq/L (ref 19–32)
CREATININE: 1 mg/dL (ref 0.4–1.5)
Calcium: 9.5 mg/dL (ref 8.4–10.5)
GFR: 82.64 mL/min (ref >60.00)
Glucose, Bld: 88 mg/dL (ref 70–99)
Potassium: 4.6 mEq/L (ref 3.5–5.1)
Sodium: 144 mEq/L (ref 135–145)

## 2014-09-28 NOTE — Telephone Encounter (Signed)
emmi mailed  °

## 2014-09-28 NOTE — Assessment & Plan Note (Signed)
Remains sober. Congratulated him on his continued success. I did suggest he attend AA or another support group.

## 2014-09-28 NOTE — Assessment & Plan Note (Signed)
New- will check labs first to rule out other possible causes of symptoms of RLS. The patient indicates understanding of these issues and agrees with the plan.

## 2014-09-28 NOTE — Assessment & Plan Note (Signed)
Improved once he stopped drinking ETOH.  Will recheck today. Orders Placed This Encounter  Procedures  . Basic metabolic panel  . TSH  . CBC with Differential  . Lipid panel

## 2014-09-28 NOTE — Assessment & Plan Note (Signed)
Well controlled. No changes made today. 

## 2014-09-28 NOTE — Assessment & Plan Note (Signed)
Deteriorated. Increase celexa to 40 mg daily. Call or return to clinic prn if these symptoms worsen or fail to improve as anticipated. The patient indicates understanding of these issues and agrees with the plan.

## 2014-09-29 ENCOUNTER — Encounter: Payer: Self-pay | Admitting: *Deleted

## 2014-09-29 LAB — LDL CHOLESTEROL, DIRECT: Direct LDL: 133.2 mg/dL

## 2014-11-22 ENCOUNTER — Other Ambulatory Visit: Payer: Self-pay | Admitting: Family Medicine

## 2014-11-23 ENCOUNTER — Ambulatory Visit (INDEPENDENT_AMBULATORY_CARE_PROVIDER_SITE_OTHER): Payer: Federal, State, Local not specified - PPO | Admitting: Internal Medicine

## 2014-11-23 ENCOUNTER — Encounter: Payer: Self-pay | Admitting: Internal Medicine

## 2014-11-23 VITALS — BP 124/86 | HR 74 | Temp 98.4°F | Wt 220.0 lb

## 2014-11-23 DIAGNOSIS — K120 Recurrent oral aphthae: Secondary | ICD-10-CM

## 2014-11-23 DIAGNOSIS — T753XXA Motion sickness, initial encounter: Secondary | ICD-10-CM

## 2014-11-23 MED ORDER — SCOPOLAMINE 1 MG/3DAYS TD PT72: 1.0000 | MEDICATED_PATCH | TRANSDERMAL | Status: DC

## 2014-11-23 MED ORDER — MAGIC MOUTHWASH: 5.0000 mL | Freq: Four times a day (QID) | ORAL | Status: DC | PRN

## 2014-11-23 NOTE — Patient Instructions (Signed)
Canker Sores  °Canker sores are painful, open sores on the inside of the mouth and cheek. They may be white or yellow. The sores usually heal in 1 to 2 weeks. Women are more likely than men to have recurrent canker sores. °CAUSES °The cause of canker sores is not well understood. More than one cause is likely. Canker sores do not appear to be caused by certain types of germs (viruses or bacteria). Canker sores may be caused by: °· An allergic reaction to certain foods. °· Digestive problems. °· Not having enough vitamin B12, folic acid, and iron. °· Male sex hormones. Sores may come only during certain phases of a menstrual cycle. Often, there is improvement during pregnancy. °· Genetics. Some people seem to inherit canker sore problems. °Emotional stress and injuries to the mouth may trigger outbreaks, but not cause them.  °DIAGNOSIS °Canker sores are diagnosed by exam.  °TREATMENT °· Patients who have frequent bouts of canker sores may have cultures taken of the sores, blood tests, or allergy tests. This helps determine if their sores are caused by a poor diet, an allergy, or some other preventable or treatable disease. °· Vitamins may prevent recurrences or reduce the severity of canker sores in people with poor nutrition. °· Numbing ointments can relieve pain. These are available in drug stores without a prescription. °· Anti-inflammatory steroid mouth rinses or gels may be prescribed by your caregiver for severe sores. °· Oral steroids may be prescribed if you have severe, recurrent canker sores. These strong medicines can cause many side effects and should be used only under the close direction of a dentist or physician. °· Mouth rinses containing the antibiotic medicine may be prescribed. They may lessen symptoms and speed healing. °Healing usually happens in about 1 or 2 weeks with or without treatment. Certain antibiotic mouth rinses given to pregnant women and young children can permanently stain teeth.  Talk to your caregiver about your treatment. °HOME CARE INSTRUCTIONS  °· Avoid foods that cause canker sores for you. °· Avoid citrus juices, spicy or salty foods, and coffee until the sores are healed. °· Use a soft-bristled toothbrush. °· Chew your food carefully to avoid biting your cheek. °· Apply topical numbing medicine to the sore to help relieve pain. °· Apply a thin paste of baking soda and water to the sore to help heal the sore. °· Only use mouth rinses or medicines for pain or discomfort as directed by your caregiver. °SEEK MEDICAL CARE IF:  °· Your symptoms are not better in 1 week. °· Your sores are still present after 2 weeks. °· Your sores are very painful. °· You have trouble breathing or swallowing. °· Your sores come back frequently. °Document Released: 02/23/2011 Document Revised: 02/23/2013 Document Reviewed: 02/23/2011 °Exit[employer]® Patient Information ©2015 [employer]. This information is not intended to replace advice given to you by your health care provider. Make sure you discuss any questions you have with your health care provider. ° °

## 2014-11-23 NOTE — Progress Notes (Signed)
Pre visit review using our clinic review tool, if applicable. No additional management support is needed unless otherwise documented below in the visit note. 

## 2014-11-23 NOTE — Progress Notes (Signed)
Subjective:    Patient ID: Jonathan [name], male    DOB: 18-Mar-1971, 44 y.o.   MRN: [id number]  HPI  Pt presents to the clinic today with c/o left ear pain. He reports this started 3-4 days ago. He has also noticed a sore under the left side of his tongue. He is having pain with swallowing. He has not noticed any drainage from his ear but he has cleaned wax out of the left ear. He does not have any pain in his teeth. He denies fever, chills or body aches.  He has tried Aleve OTC without any relief.   Additionally, he reports that he is going on a cruise in 1 week. He would like an RX for scopolamine patches.  Review of Systems      Past Medical History  Diagnosis Date  . Anxiety   . Hypertension     Current Outpatient Prescriptions  Medication Sig Dispense Refill  . citalopram (CELEXA) 40 MG tablet Take 1 tablet (40 mg total) by mouth daily. 30 tablet 3  . cyclobenzaprine (FLEXERIL) 10 MG tablet TAKE 1 TABLET (10 MG TOTAL) BY MOUTH AT BEDTIME AS NEEDED FOR MUSCLE SPASMS. 30 tablet 1  . losartan-hydrochlorothiazide (HYZAAR) 50-12.5 MG per tablet Take 1 tablet by mouth daily. 90 tablet 1  . Multiple Vitamin (MULTIVITAMIN) tablet Take 1 tablet by mouth daily.    . Multiple Vitamins-Minerals (THERATRUM COMPLETE PO) Take by mouth.    Marland Kitchen omeprazole (PRILOSEC) 40 MG capsule Take one capsule by mouth daily 90 capsule 3   No current facility-administered medications for this visit.    Allergies  Allergen Reactions  . Ace Inhibitors     Cough     Family History  Problem Relation Age of Onset  . Cancer Mother     breast  . Cancer Father     prostate    History   Social History  . Marital Status: Married    Spouse Name: N/A    Number of Children: 0  . Years of Education: N/A   Occupational History  . Not on file.   Social History Main Topics  . Smoking status: Former Smoker    Quit date: 11/12/1993  . Smokeless tobacco: Not on file  . Alcohol Use: Not on file  .  Drug Use: Not on file  . Sexual Activity: Not on file   Other Topics Concern  . Not on file   Social History Narrative     Constitutional: Denies fever, malaise, fatigue, headache or abrupt weight changes.  HEENT: Pt reports left ear pain, sore under his tongue. Denies eye pain, eye redness, ringing in the ears, wax buildup, runny nose, nasal congestion, bloody nose, or sore throat. Respiratory: Denies difficulty breathing, shortness of breath, cough or sputum production.   Cardiovascular: Denies chest pain, chest tightness, palpitations or swelling in the hands or feet.  Skin: Denies redness, rashes, lesions or ulcercations.  Neurological: Denies dizziness, difficulty with memory, difficulty with speech or problems with balance and coordination.   No other specific complaints in a complete review of systems (except as listed in HPI above).  Objective:   Physical Exam   BP 124/86 mmHg  Pulse 74  Temp(Src) 98.4 F (36.9 C) (Oral)  Wt 220 lb (99.791 kg)  SpO2 98% Wt Readings from Last 3 Encounters:  11/23/14 220 lb (99.791 kg)  09/27/14 223 lb (101.152 kg)  04/12/14 205 lb 8 oz (93.214 kg)    General: Appears his stated  age, obese in NAD. Skin: Warm, dry and intact. No rashes, lesions or ulcerations noted. HEENT: Head: normal shape and size, no sinus tenderness note; Eyes: sclera white, no icterus, conjunctiva pink; Ears: Tm's gray and intact, normal light reflex; Nose: mucosa pink and moist, septum midline; Throat/Mouth: Teeth present, mucosa pink and moist, no exudate noted. Small canker sore noted on left side of tongue. Neck: Cervical adenopathy noted on the left side. Cardiovascular: Normal rate and rhythm. S1,S2 noted.  No murmur, rubs or gallops noted.  Pulmonary/Chest: Normal effort and positive vesicular breath sounds. No respiratory distress. No wheezes, rales or ronchi noted.  Neurological: Alert and oriented.   BMET    Component Value Date/Time   NA 144  09/27/2014 1501   K 4.6 09/27/2014 1501   CL 110 09/27/2014 1501   CO2 21 09/27/2014 1501   GLUCOSE 88 09/27/2014 1501   BUN 20 09/27/2014 1501   CREATININE 1.0 09/27/2014 1501   CALCIUM 9.5 09/27/2014 1501   GFRNONAA 95.90 09/13/2010 1103    Lipid Panel     Component Value Date/Time   CHOL 194 09/27/2014 1501   TRIG 252.0* 09/27/2014 1501   HDL 34.90* 09/27/2014 1501   CHOLHDL 6 09/27/2014 1501   VLDL 50.4* 09/27/2014 1501   LDLCALC 82 04/12/2014 1545    CBC    Component Value Date/Time   WBC 8.9 09/27/2014 1501   RBC 5.42 09/27/2014 1501   HGB 15.3 09/27/2014 1501   HCT 46.1 09/27/2014 1501   PLT 190.0 09/27/2014 1501   MCV 84.9 09/27/2014 1501   MCHC 33.3 09/27/2014 1501   RDW 12.8 09/27/2014 1501   LYMPHSABS 2.4 09/27/2014 1501   MONOABS 0.6 09/27/2014 1501   EOSABS 0.2 09/27/2014 1501   BASOSABS 0.0 09/27/2014 1501    Hgb A1C No results found for: HGBA1C      Assessment & Plan:   Canker sore:  Will give RX for magic mouthwash QID until resolved Avoid hot or spicy food at this will make the pain worse  Sea sick:  eRx for scopolamine patches  RTC as needed or if symptoms persist or worsen

## 2015-01-24 ENCOUNTER — Ambulatory Visit (INDEPENDENT_AMBULATORY_CARE_PROVIDER_SITE_OTHER): Payer: Federal, State, Local not specified - PPO | Admitting: Family Medicine

## 2015-01-24 VITALS — BP 122/74 | HR 90 | Temp 98.1°F | Wt 224.5 lb

## 2015-01-24 DIAGNOSIS — Z0000 Encounter for general adult medical examination without abnormal findings: Secondary | ICD-10-CM

## 2015-01-24 DIAGNOSIS — K121 Other forms of stomatitis: Secondary | ICD-10-CM

## 2015-01-24 LAB — LIPID PANEL
CHOLESTEROL: 163 mg/dL (ref 0–200)
HDL: 32.9 mg/dL — ABNORMAL LOW (ref >39.00)
LDL CALC: 105 mg/dL — AB (ref 0–99)
NonHDL: 130.1
TRIGLYCERIDES: 126 mg/dL (ref 0.0–149.0)
Total CHOL/HDL Ratio: 5
VLDL: 25.2 mg/dL (ref 0.0–40.0)

## 2015-01-24 LAB — CBC WITH DIFFERENTIAL/PLATELET
BASOS ABS: 0 10*3/uL (ref 0.0–0.1)
Basophils Relative: 0.1 % (ref 0.0–3.0)
EOS ABS: 0.2 10*3/uL (ref 0.0–0.7)
EOS PCT: 2.5 % (ref 0.0–5.0)
HEMATOCRIT: 49.5 % (ref 39.0–52.0)
Hemoglobin: 16.7 g/dL (ref 13.0–17.0)
Lymphocytes Relative: 27.7 % (ref 12.0–46.0)
Lymphs Abs: 2.5 10*3/uL (ref 0.7–4.0)
MCHC: 33.8 g/dL (ref 30.0–36.0)
MCV: 83.8 fl (ref 78.0–100.0)
MONO ABS: 0.7 10*3/uL (ref 0.1–1.0)
MONOS PCT: 8.4 % (ref 3.0–12.0)
NEUTROS ABS: 5.4 10*3/uL (ref 1.4–7.7)
NEUTROS PCT: 61.3 % (ref 43.0–77.0)
Platelets: 205 10*3/uL (ref 150.0–400.0)
RBC: 5.91 Mil/uL — ABNORMAL HIGH (ref 4.22–5.81)
RDW: 13 % (ref 11.5–15.5)
WBC: 8.9 10*3/uL (ref 4.0–10.5)

## 2015-01-24 LAB — COMPREHENSIVE METABOLIC PANEL
ALK PHOS: 88 U/L (ref 39–117)
ALT: 20 U/L (ref 0–53)
AST: 15 U/L (ref 0–37)
Albumin: 4.7 g/dL (ref 3.5–5.2)
BUN: 11 mg/dL (ref 6–23)
CO2: 29 mEq/L (ref 19–32)
CREATININE: 0.91 mg/dL (ref 0.40–1.50)
Calcium: 9.6 mg/dL (ref 8.4–10.5)
Chloride: 104 mEq/L (ref 96–112)
GFR: 96.26 mL/min (ref >60.00)
Glucose, Bld: 103 mg/dL — ABNORMAL HIGH (ref 70–99)
POTASSIUM: 4.2 meq/L (ref 3.5–5.1)
Sodium: 139 mEq/L (ref 135–145)
Total Bilirubin: 0.5 mg/dL (ref 0.2–1.2)
Total Protein: 6.9 g/dL (ref 6.0–8.3)

## 2015-01-24 LAB — PSA: PSA: 0.94 ng/mL (ref 0.10–4.00)

## 2015-01-24 MED ORDER — TRIAMCINOLONE ACETONIDE 0.1 % MT PSTE: 1.0000 "application " | PASTE | Freq: Two times a day (BID) | OROMUCOSAL | Status: DC

## 2015-01-24 NOTE — Patient Instructions (Signed)
Good to see you. Congratulations on your upcoming anniversary!  We will call you with your lab results. Keep me updated.

## 2015-01-24 NOTE — Progress Notes (Signed)
Pre visit review using our clinic review tool, if applicable. No additional management support is needed unless otherwise documented below in the visit note. 

## 2015-01-24 NOTE — Assessment & Plan Note (Signed)
New- not much relief with abreva and magic mouthwash. eRx sent for triamcinolone paste 0.1% twice daily.  He will call me if no improvement within 5 days of use. The patient indicates understanding of these issues and agrees with the plan.

## 2015-01-24 NOTE — Progress Notes (Signed)
Subjective:   Patient ID: Jonathan [name], male    DOB: 1971-08-03, 44 y.o.   MRN: [id number]  Jonathan [name] is a pleasant 44 y.o. year old male who presents to clinic today with Ear Pain  on 01/24/2015  HPI:  H/o canker sores. This feels different.  Sores in his mouth, mainly on side of mouth, painful to eat. No pain with swallowing but has pain when food or liquid touches sores or when he chews. No fevers. Dental exam up to date.  Has magic mouthwash and abreva but not resolving as quickly as he hoped.  He would like routine blood work done today.  Current Outpatient Prescriptions on File Prior to Visit  Medication Sig Dispense Refill  . Alum & Mag Hydroxide-Simeth (MAGIC MOUTHWASH) SOLN Take 5 mLs by mouth 4 (four) times daily as needed for mouth pain. 60 mL 0  . citalopram (CELEXA) 40 MG tablet Take 1 tablet (40 mg total) by mouth daily. 30 tablet 3  . cyclobenzaprine (FLEXERIL) 10 MG tablet TAKE 1 TABLET (10 MG TOTAL) BY MOUTH AT BEDTIME AS NEEDED FOR MUSCLE SPASMS. 30 tablet 1  . losartan-hydrochlorothiazide (HYZAAR) 50-12.5 MG per tablet Take 1 tablet by mouth daily. 90 tablet 1  . Multiple Vitamin (MULTIVITAMIN) tablet Take 1 tablet by mouth daily.    . Multiple Vitamins-Minerals (THERATRUM COMPLETE PO) Take by mouth.    Marland Kitchen omeprazole (PRILOSEC) 40 MG capsule Take one capsule by mouth daily 90 capsule 3   No current facility-administered medications on file prior to visit.    Allergies  Allergen Reactions  . Ace Inhibitors     Cough     Past Medical History  Diagnosis Date  . Anxiety   . Hypertension     No past surgical history on file.  Family History  Problem Relation Age of Onset  . Cancer Mother     breast  . Cancer Father     prostate    History   Social History  . Marital Status: Married    Spouse Name: N/A  . Number of Children: 0  . Years of Education: N/A   Occupational History  . Not on file.   Social History Main Topics  . Smoking  status: Former Smoker    Quit date: 11/12/1993  . Smokeless tobacco: Not on file  . Alcohol Use: Not on file  . Drug Use: Not on file  . Sexual Activity: Not on file   Other Topics Concern  . Not on file   Social History Narrative   The PMH, PSH, Social History, Family History, Medications, and allergies have been reviewed in [facility], and have been updated if relevant.   Review of Systems  Constitutional: Negative.   HENT: Positive for mouth sores. Negative for congestion, dental problem, drooling, ear discharge, ear pain, facial swelling, hearing loss, sinus pressure and sneezing.   Psychiatric/Behavioral: Negative.   All other systems reviewed and are negative.      Objective:    BP 122/74 mmHg  Pulse 90  Temp(Src) 98.1 F (36.7 C) (Oral)  Wt 224 lb 8 oz (101.833 kg)  SpO2 95%   Physical Exam  Constitutional: He is oriented to person, place, and time. He appears well-developed and well-nourished. No distress.  HENT:  Head: Normocephalic.  Mouth/Throat: Oral lesions present.    Cardiovascular: Normal rate.   Pulmonary/Chest: Effort normal.  Neurological: He is alert and oriented to person, place, and time. No cranial nerve deficit.  Skin: Skin  is warm and dry.  Psychiatric: He has a normal mood and affect. His behavior is normal. Judgment and thought content normal.  Nursing note and vitals reviewed.         Assessment & Plan:   Visit for well man health check - Plan: CBC with Differential/Platelet, Comprehensive metabolic panel, PSA, Lipid panel No Follow-up on file.

## 2015-01-25 ENCOUNTER — Encounter: Payer: Self-pay | Admitting: *Deleted

## 2015-01-26 ENCOUNTER — Other Ambulatory Visit: Payer: Self-pay | Admitting: Family Medicine

## 2015-01-28 ENCOUNTER — Other Ambulatory Visit: Payer: Self-pay | Admitting: Family Medicine

## 2015-02-03 ENCOUNTER — Ambulatory Visit: Payer: Self-pay | Admitting: Physician Assistant

## 2015-02-21 ENCOUNTER — Other Ambulatory Visit: Payer: Self-pay | Admitting: Family Medicine

## 2015-02-21 NOTE — Telephone Encounter (Signed)
Last OV 01/24/15, last filled 01/27/15

## 2015-03-12 ENCOUNTER — Other Ambulatory Visit: Payer: Self-pay | Admitting: Family Medicine

## 2015-04-17 ENCOUNTER — Other Ambulatory Visit: Payer: Self-pay | Admitting: Family Medicine

## 2015-05-04 ENCOUNTER — Ambulatory Visit (INDEPENDENT_AMBULATORY_CARE_PROVIDER_SITE_OTHER): Payer: Federal, State, Local not specified - PPO | Admitting: Family Medicine

## 2015-05-04 VITALS — BP 116/62 | HR 82 | Temp 98.0°F | Wt 228.5 lb

## 2015-05-04 DIAGNOSIS — B079 Viral wart, unspecified: Secondary | ICD-10-CM | POA: Insufficient documentation

## 2015-05-04 MED ORDER — ALBUTEROL SULFATE HFA 108 (90 BASE) MCG/ACT IN AERS: 2.0000 | INHALATION_SPRAY | Freq: Four times a day (QID) | RESPIRATORY_TRACT | Status: DC | PRN

## 2015-05-04 NOTE — Patient Instructions (Signed)
Warts Warts are a common viral infection. They are most commonly caused by the human papillomavirus (HPV). Warts can occur at all ages. However, they occur most frequently in older children and infrequently in the elderly. Warts may be single or multiple. Location and size varies. Warts can be spread by scratching the wart and then scratching normal skin. The life cycle of warts varies. However, most will disappear over many months to a couple years. Warts commonly do not cause problems (asymptomatic) unless they are over an area of pressure, such as the bottom of the foot. If they are large enough, they may cause pain with walking. DIAGNOSIS  Warts are most commonly diagnosed by their appearance. Tissue samples (biopsies) are not required unless the wart looks abnormal. Most warts have a rough surface, are round, oval, or irregular, and are skin-colored to light yellow, brown, or gray. They are generally less than  inch (1.3 cm), but they can be any size. TREATMENT   Observation or no treatment.  Freezing with liquid nitrogen.  High heat (cautery).  Boosting the body's immunity to fight off the wart (immunotherapy using Candida antigen).  Laser surgery.  Application of various irritants and solutions. HOME CARE INSTRUCTIONS  Follow your caregiver's instructions. No special precautions are necessary. Often, treatment may be followed by a return (recurrence) of warts. Warts are generally difficult to treat and get rid of. If treatment is done in a clinic setting, usually more than 1 treatment is required. This is usually done on only a monthly basis until the wart is completely gone. SEEK IMMEDIATE MEDICAL CARE IF: The treated skin becomes red, puffy (swollen), or painful. Document Released: 08/08/2005 Document Revised: 02/23/2013 Document Reviewed: 02/03/2010 [facility] Patient Information 2015 [place], [place]. This information is not intended to replace advice given to you by your health care  provider. Make sure you discuss any questions you have with your health care provider.

## 2015-05-04 NOTE — Progress Notes (Signed)
Pre visit review using our clinic review tool, if applicable. No additional management support is needed unless otherwise documented below in the visit note. 

## 2015-05-04 NOTE — Assessment & Plan Note (Signed)
New- seems most consistent with wart.  He declined cryotherapy or further tx.  He prefers to try OTC and watchful waiting. Call or return to clinic prn if these symptoms worsen or fail to improve as anticipated. The patient indicates understanding of these issues and agrees with the plan.

## 2015-05-04 NOTE — Progress Notes (Signed)
Subjective:   Patient ID: Jonathan [name], male    DOB: [birth year], 44 y.o.   MRN: [id number]  Jonathan [name] is a pleasant 44 y.o. year old male who presents to clinic today with Mass  on 05/04/2015  HPI:  Noticed a mass on top of his head, bordering his scalp line for past few weeks.  Not growing in size or painful.  He caught it on his hat once and it bled a little.  Never been red, swollen or tender.  Has not tried anything for it.  Current Outpatient Prescriptions on File Prior to Visit  Medication Sig Dispense Refill  . citalopram (CELEXA) 40 MG tablet TAKE 1 TABLET (40 MG TOTAL) BY MOUTH DAILY. 30 tablet 3  . cyclobenzaprine (FLEXERIL) 10 MG tablet TAKE 1 TABLET (10 MG TOTAL) BY MOUTH AT BEDTIME AS NEEDED FOR MUSCLE SPASMS. 30 tablet 1  . losartan-hydrochlorothiazide (HYZAAR) 50-12.5 MG per tablet TAKE 1 TABLET BY MOUTH DAILY. 90 tablet 1  . Multiple Vitamin (MULTIVITAMIN) tablet Take 1 tablet by mouth daily.    . Multiple Vitamins-Minerals (THERATRUM COMPLETE PO) Take by mouth.    Marland Kitchen omeprazole (PRILOSEC) 40 MG capsule TAKE ONE CAPSULE BY MOUTH DAILY 90 capsule 0  . triamcinolone (KENALOG) 0.1 % paste Use as directed 1 application in the mouth or throat 2 (two) times daily. 5 g 12   No current facility-administered medications on file prior to visit.    Allergies  Allergen Reactions  . Ace Inhibitors     Cough     Past Medical History  Diagnosis Date  . Anxiety   . Hypertension     No past surgical history on file.  Family History  Problem Relation Age of Onset  . Cancer Mother     breast  . Cancer Father     prostate    History   Social History  . Marital Status: Married    Spouse Name: N/A  . Number of Children: 0  . Years of Education: N/A   Occupational History  . Not on file.   Social History Main Topics  . Smoking status: Former Smoker    Quit date: 11/12/1993  . Smokeless tobacco: Not on file  . Alcohol Use: Not on file  .  Drug Use: Not on file  . Sexual Activity: Not on file   Other Topics Concern  . Not on file   Social History Narrative   The PMH, PSH, Social History, Family History, Medications, and allergies have been reviewed in [facility], and have been updated if relevant.   Review of Systems  Constitutional: Negative.   Endocrine: Negative.   Hematological: Negative.   All other systems reviewed and are negative.      Objective:    BP 116/62 mmHg  Pulse 82  Temp(Src) 98 F (36.7 C) (Oral)  Wt 228 lb 8 oz (103.647 kg)  SpO2 98%   Physical Exam  Constitutional: He is oriented to person, place, and time. He appears well-developed and well-nourished. No distress.  Eyes: Conjunctivae are normal.  Cardiovascular: Normal rate.   Pulmonary/Chest: Effort normal.  Musculoskeletal: Normal range of motion.  Neurological: He is alert and oriented to person, place, and time. No cranial nerve deficit.  Skin:     Psychiatric: He has a normal mood and affect. His behavior is normal. Judgment and thought content normal.  Nursing note and vitals reviewed.         Assessment & Plan:  Wart of scalp No Follow-up on file.

## 2015-06-07 ENCOUNTER — Encounter: Payer: Self-pay | Admitting: Internal Medicine

## 2015-06-07 ENCOUNTER — Ambulatory Visit (INDEPENDENT_AMBULATORY_CARE_PROVIDER_SITE_OTHER): Payer: Federal, State, Local not specified - PPO | Admitting: Internal Medicine

## 2015-06-07 VITALS — BP 134/86 | HR 85 | Temp 98.3°F | Wt 229.0 lb

## 2015-06-07 DIAGNOSIS — M79672 Pain in left foot: Secondary | ICD-10-CM | POA: Diagnosis not present

## 2015-06-07 NOTE — Patient Instructions (Signed)
Plantar Fasciitis (Heel Spur Syndrome) with Rehab The plantar fascia is a fibrous, ligament-like, soft-tissue structure that spans the bottom of the foot. Plantar fasciitis is a condition that causes pain in the foot due to inflammation of the tissue. SYMPTOMS   Pain and tenderness on the underneath side of the foot.  Pain that worsens with standing or walking. CAUSES  Plantar fasciitis is caused by irritation and injury to the plantar fascia on the underneath side of the foot. Common mechanisms of injury include:  Direct trauma to bottom of the foot.  Damage to a small nerve that runs under the foot where the main fascia attaches to the heel bone.  Stress placed on the plantar fascia due to bone spurs. RISK INCREASES WITH:   Activities that place stress on the plantar fascia (running, jumping, pivoting, or cutting).  Poor strength and flexibility.  Improperly fitted shoes.  Tight calf muscles.  Flat feet.  Failure to warm-up properly before activity.  Obesity. PREVENTION  Warm up and stretch properly before activity.  Allow for adequate recovery between workouts.  Maintain physical fitness:  Strength, flexibility, and endurance.  Cardiovascular fitness.  Maintain a health body weight.  Avoid stress on the plantar fascia.  Wear properly fitted shoes, including arch supports for individuals who have flat feet. PROGNOSIS  If treated properly, then the symptoms of plantar fasciitis usually resolve without surgery. However, occasionally surgery is necessary. RELATED COMPLICATIONS   Recurrent symptoms that may result in a chronic condition.  Problems of the lower back that are caused by compensating for the injury, such as limping.  Pain or weakness of the foot during push-off following surgery.  Chronic inflammation, scarring, and partial or complete fascia tear, occurring more often from repeated injections. TREATMENT  Treatment initially involves the use of  ice and medication to help reduce pain and inflammation. The use of strengthening and stretching exercises may help reduce pain with activity, especially stretches of the Achilles tendon. These exercises may be performed at home or with a therapist. Your caregiver may recommend that you use heel cups of arch supports to help reduce stress on the plantar fascia. Occasionally, corticosteroid injections are given to reduce inflammation. If symptoms persist for greater than 6 months despite non-surgical (conservative), then surgery may be recommended.  MEDICATION   If pain medication is necessary, then nonsteroidal anti-inflammatory medications, such as aspirin and ibuprofen, or other minor pain relievers, such as acetaminophen, are often recommended.  Do not take pain medication within 7 days before surgery.  Prescription pain relievers may be given if deemed necessary by your caregiver. Use only as directed and only as much as you need.  Corticosteroid injections may be given by your caregiver. These injections should be reserved for the most serious cases, because they may only be given a certain number of times. HEAT AND COLD  Cold treatment (icing) relieves pain and reduces inflammation. Cold treatment should be applied for 10 to 15 minutes every 2 to 3 hours for inflammation and pain and immediately after any activity that aggravates your symptoms. Use ice packs or massage the area with a piece of ice (ice massage).  Heat treatment may be used prior to performing the stretching and strengthening activities prescribed by your caregiver, physical therapist, or athletic trainer. Use a heat pack or soak the injury in warm water. SEEK IMMEDIATE MEDICAL CARE IF:  Treatment seems to offer no benefit, or the condition worsens.  Any medications produce adverse side effects. EXERCISES RANGE   OF MOTION (ROM) AND STRETCHING EXERCISES - Plantar Fasciitis (Heel Spur Syndrome) These exercises may help you  when beginning to rehabilitate your injury. Your symptoms may resolve with or without further involvement from your physician, physical therapist or athletic trainer. While completing these exercises, remember:   Restoring tissue flexibility helps normal motion to return to the joints. This allows healthier, less painful movement and activity.  An effective stretch should be held for at least 30 seconds.  A stretch should never be painful. You should only feel a gentle lengthening or release in the stretched tissue. RANGE OF MOTION - Toe Extension, Flexion  Sit with your right / left leg crossed over your opposite knee.  Grasp your toes and gently pull them back toward the top of your foot. You should feel a stretch on the bottom of your toes and/or foot.  Hold this stretch for __________ seconds.  Now, gently pull your toes toward the bottom of your foot. You should feel a stretch on the top of your toes and or foot.  Hold this stretch for __________ seconds. Repeat __________ times. Complete this stretch __________ times per day.  RANGE OF MOTION - Ankle Dorsiflexion, Active Assisted  Remove shoes and sit on a chair that is preferably not on a carpeted surface.  Place right / left foot under knee. Extend your opposite leg for support.  Keeping your heel down, slide your right / left foot back toward the chair until you feel a stretch at your ankle or calf. If you do not feel a stretch, slide your bottom forward to the edge of the chair, while still keeping your heel down.  Hold this stretch for __________ seconds. Repeat __________ times. Complete this stretch __________ times per day.  STRETCH - Gastroc, Standing  Place hands on wall.  Extend right / left leg, keeping the front knee somewhat bent.  Slightly point your toes inward on your back foot.  Keeping your right / left heel on the floor and your knee straight, shift your weight toward the wall, not allowing your back to  arch.  You should feel a gentle stretch in the right / left calf. Hold this position for __________ seconds. Repeat __________ times. Complete this stretch __________ times per day. STRETCH - Soleus, Standing  Place hands on wall.  Extend right / left leg, keeping the other knee somewhat bent.  Slightly point your toes inward on your back foot.  Keep your right / left heel on the floor, bend your back knee, and slightly shift your weight over the back leg so that you feel a gentle stretch deep in your back calf.  Hold this position for __________ seconds. Repeat __________ times. Complete this stretch __________ times per day. STRETCH - Gastrocsoleus, Standing  Note: This exercise can place a lot of stress on your foot and ankle. Please complete this exercise only if specifically instructed by your caregiver.   Place the ball of your right / left foot on a step, keeping your other foot firmly on the same step.  Hold on to the wall or a rail for balance.  Slowly lift your other foot, allowing your body weight to press your heel down over the edge of the step.  You should feel a stretch in your right / left calf.  Hold this position for __________ seconds.  Repeat this exercise with a slight bend in your right / left knee. Repeat __________ times. Complete this stretch __________ times per day.    STRENGTHENING EXERCISES - Plantar Fasciitis (Heel Spur Syndrome)  These exercises may help you when beginning to rehabilitate your injury. They may resolve your symptoms with or without further involvement from your physician, physical therapist or athletic trainer. While completing these exercises, remember:   Muscles can gain both the endurance and the strength needed for everyday activities through controlled exercises.  Complete these exercises as instructed by your physician, physical therapist or athletic trainer. Progress the resistance and repetitions only as guided. STRENGTH -  Towel Curls  Sit in a chair positioned on a non-carpeted surface.  Place your foot on a towel, keeping your heel on the floor.  Pull the towel toward your heel by only curling your toes. Keep your heel on the floor.  If instructed by your physician, physical therapist or athletic trainer, add ____________________ at the end of the towel. Repeat __________ times. Complete this exercise __________ times per day. STRENGTH - Ankle Inversion  Secure one end of a rubber exercise band/tubing to a fixed object (table, pole). Loop the other end around your foot just before your toes.  Place your fists between your knees. This will focus your strengthening at your ankle.  Slowly, pull your big toe up and in, making sure the band/tubing is positioned to resist the entire motion.  Hold this position for __________ seconds.  Have your muscles resist the band/tubing as it slowly pulls your foot back to the starting position. Repeat __________ times. Complete this exercises __________ times per day.  Document Released: 10/29/2005 Document Revised: 01/21/2012 Document Reviewed: 02/10/2009 [employer] Patient Information 2015 [employer]. This information is not intended to replace advice given to you by your health care provider. Make sure you discuss any questions you have with your health care provider.  

## 2015-06-07 NOTE — Progress Notes (Signed)
Subjective:    Patient ID: Jonathan [name], male    DOB: [birth year], 44 y.o.   MRN: [id number]  HPI  Pt presents to the clinic today with c/o left heel pain. This started 2 months ago but has gotten worse in the last 2-3 week. He describes the pain as a "sharp pin" going through his heel. It is worse with walking and better with sitting down. He has not noticed any swelling. He has tried Tylenol with minimal relief. He does wear gel inserts in his shoes. He denies any injury.  Review of Systems      Past Medical History  Diagnosis Date  . Anxiety   . Hypertension     Current Outpatient Prescriptions  Medication Sig Dispense Refill  . albuterol (PROVENTIL HFA;VENTOLIN HFA) 108 (90 BASE) MCG/ACT inhaler Inhale 2 puffs into the lungs every 6 (six) hours as needed. 1 Inhaler 0  . citalopram (CELEXA) 40 MG tablet TAKE 1 TABLET (40 MG TOTAL) BY MOUTH DAILY. 30 tablet 3  . cyclobenzaprine (FLEXERIL) 10 MG tablet TAKE 1 TABLET (10 MG TOTAL) BY MOUTH AT BEDTIME AS NEEDED FOR MUSCLE SPASMS. 30 tablet 1  . losartan-hydrochlorothiazide (HYZAAR) 50-12.5 MG per tablet TAKE 1 TABLET BY MOUTH DAILY. 90 tablet 1  . Multiple Vitamin (MULTIVITAMIN) tablet Take 1 tablet by mouth daily.    . Multiple Vitamins-Minerals (THERATRUM COMPLETE PO) Take by mouth.    Marland Kitchen omeprazole (PRILOSEC) 40 MG capsule TAKE ONE CAPSULE BY MOUTH DAILY 90 capsule 0  . triamcinolone (KENALOG) 0.1 % paste Use as directed 1 application in the mouth or throat 2 (two) times daily. 5 g 12   No current facility-administered medications for this visit.    Allergies  Allergen Reactions  . Ace Inhibitors     Cough     Family History  Problem Relation Age of Onset  . Cancer Mother     breast  . Cancer Father     prostate    History   Social History  . Marital Status: Married    Spouse Name: N/A  . Number of Children: 0  . Years of Education: N/A   Occupational History  . Not on file.   Social History Main  Topics  . Smoking status: Former Smoker    Quit date: 11/12/1993  . Smokeless tobacco: Not on file  . Alcohol Use: Not on file  . Drug Use: Not on file  . Sexual Activity: Not on file   Other Topics Concern  . Not on file   Social History Narrative     Constitutional: Denies fever, malaise, fatigue, headache or abrupt weight changes.  Respiratory: Denies difficulty breathing, shortness of breath, cough or sputum production.   Cardiovascular: Denies chest pain, chest tightness, palpitations or swelling in the hands or feet.  Musculoskeletal: Pt reports left heel pain. Denies decrease in range of motion, difficulty with gait, muscle pain or joint pain and swelling.  Skin: Denies redness, rashes, lesions or ulcercations.  Neurological: Denies numbness or tingling in his feet, or problems with balance and coordination.   No other specific complaints in a complete review of systems (except as listed in HPI above).  Objective:   Physical Exam  BP 134/86 mmHg  Pulse 85  Temp(Src) 98.3 F (36.8 C) (Oral)  Wt 229 lb (103.874 kg)  SpO2 98% Wt Readings from Last 3 Encounters:  06/07/15 229 lb (103.874 kg)  05/04/15 228 lb 8 oz (103.647 kg)  01/24/15 224 lb  8 oz (101.833 kg)    General: Appears his stated age, well developed, well nourished in NAD. Cardiovascular: Normal rate and rhythm. S1,S2 noted.  No murmur, rubs or gallops noted. No JVD or BLE edema. No carotid bruits noted. Pulmonary/Chest: Normal effort and positive vesicular breath sounds. No respiratory distress. No wheezes, rales or ronchi noted.  Musculoskeletal: Normal flexion, extension and rotation of the left ankle. Mild pain with palpation of the left heel. He is avoiding walking on his left heel.  Neurological: Alert and oriented. Sensation intact to BLE.   BMET    Component Value Date/Time   NA 139 01/24/2015 0948   K 4.2 01/24/2015 0948   CL 104 01/24/2015 0948   CO2 29 01/24/2015 0948   GLUCOSE 103*  01/24/2015 0948   BUN 11 01/24/2015 0948   CREATININE 0.91 01/24/2015 0948   CALCIUM 9.6 01/24/2015 0948   GFRNONAA 95.90 09/13/2010 1103    Lipid Panel     Component Value Date/Time   CHOL 163 01/24/2015 0948   TRIG 126.0 01/24/2015 0948   HDL 32.90* 01/24/2015 0948   CHOLHDL 5 01/24/2015 0948   VLDL 25.2 01/24/2015 0948   LDLCALC 105* 01/24/2015 0948    CBC    Component Value Date/Time   WBC 8.9 01/24/2015 0948   RBC 5.91* 01/24/2015 0948   HGB 16.7 01/24/2015 0948   HCT 49.5 01/24/2015 0948   PLT 205.0 01/24/2015 0948   MCV 83.8 01/24/2015 0948   MCHC 33.8 01/24/2015 0948   RDW 13.0 01/24/2015 0948   LYMPHSABS 2.5 01/24/2015 0948   MONOABS 0.7 01/24/2015 0948   EOSABS 0.2 01/24/2015 0948   BASOSABS 0.0 01/24/2015 0948    Hgb A1C No results found for: HGBA1C       Assessment & Plan:   Left heel pain:  Likely a bone spur He declines xray today Advised him to take Aleve instead of Tylenol Continue with the shoe inserts, he may need custom inserts Plantar fasciitis rehab exercises given If no improvement, make an appt with Dr. Lorelei Pont for further evaluation  RTC as needed or if symptoms persist or worsen

## 2015-06-07 NOTE — Progress Notes (Signed)
Pre visit review using our clinic review tool, if applicable. No additional management support is needed unless otherwise documented below in the visit note. 

## 2015-07-13 ENCOUNTER — Other Ambulatory Visit: Payer: Self-pay | Admitting: Family Medicine

## 2015-07-20 ENCOUNTER — Other Ambulatory Visit: Payer: Self-pay | Admitting: Family Medicine

## 2015-09-08 ENCOUNTER — Other Ambulatory Visit: Payer: Self-pay | Admitting: Family Medicine

## 2015-10-18 ENCOUNTER — Other Ambulatory Visit: Payer: Self-pay | Admitting: Family Medicine

## 2015-10-27 ENCOUNTER — Encounter: Payer: Self-pay | Admitting: Family Medicine

## 2015-10-27 ENCOUNTER — Ambulatory Visit (INDEPENDENT_AMBULATORY_CARE_PROVIDER_SITE_OTHER): Payer: Federal, State, Local not specified - PPO | Admitting: Family Medicine

## 2015-10-27 VITALS — BP 118/72 | HR 80 | Temp 98.0°F | Wt 232.8 lb

## 2015-10-27 DIAGNOSIS — G4710 Hypersomnia, unspecified: Secondary | ICD-10-CM | POA: Diagnosis not present

## 2015-10-27 DIAGNOSIS — G4719 Other hypersomnia: Secondary | ICD-10-CM | POA: Insufficient documentation

## 2015-10-27 DIAGNOSIS — R400 Somnolence: Secondary | ICD-10-CM

## 2015-10-27 LAB — COMPREHENSIVE METABOLIC PANEL
ALBUMIN: 4.6 g/dL (ref 3.5–5.2)
ALK PHOS: 71 U/L (ref 39–117)
ALT: 27 U/L (ref 0–53)
AST: 21 U/L (ref 0–37)
BUN: 12 mg/dL (ref 6–23)
CALCIUM: 9.8 mg/dL (ref 8.4–10.5)
CHLORIDE: 102 meq/L (ref 96–112)
CO2: 32 mEq/L (ref 19–32)
Creatinine, Ser: 0.99 mg/dL (ref 0.40–1.50)
GFR: 87.04 mL/min (ref >60.00)
Glucose, Bld: 97 mg/dL (ref 70–99)
Potassium: 4.1 mEq/L (ref 3.5–5.1)
Sodium: 139 mEq/L (ref 135–145)
Total Bilirubin: 1 mg/dL (ref 0.2–1.2)
Total Protein: 7.4 g/dL (ref 6.0–8.3)

## 2015-10-27 LAB — CBC
HEMATOCRIT: 50.3 % (ref 39.0–52.0)
HEMOGLOBIN: 16.4 g/dL (ref 13.0–17.0)
MCHC: 32.7 g/dL (ref 30.0–36.0)
MCV: 85 fl (ref 78.0–100.0)
Platelets: 208 10*3/uL (ref 150.0–400.0)
RBC: 5.91 Mil/uL — AB (ref 4.22–5.81)
RDW: 13 % (ref 11.5–15.5)
WBC: 8 10*3/uL (ref 4.0–10.5)

## 2015-10-27 LAB — VITAMIN D 25 HYDROXY (VIT D DEFICIENCY, FRACTURES): VITD: 15.66 ng/mL — AB (ref 30.00–100.00)

## 2015-10-27 LAB — VITAMIN B12: Vitamin B-12: 360 pg/mL (ref 211–911)

## 2015-10-27 LAB — TSH: TSH: 2.18 u[IU]/mL (ref 0.35–4.50)

## 2015-10-27 NOTE — Assessment & Plan Note (Signed)
New- declines sleep study, does not snore regularly. Check labs today as initial work up. Orders Placed This Encounter  Procedures  . Vitamin B12  . TSH  . Comprehensive metabolic panel  . Vitamin D 1,25 dihydroxy  . CBC

## 2015-10-27 NOTE — Patient Instructions (Signed)
Happy Holidays! We will call you with your lab results.

## 2015-10-27 NOTE — Progress Notes (Signed)
Subjective:   Patient ID: Jonathan [name], male    DOB: October 16, 1971, 44 y.o.   MRN: [id number]  Jonathan [name] is a pleasant 44 y.o. year old male who presents to clinic today with Fatigue  on 10/27/2015  HPI:  Overall doing well.  Sober for a year and a half now.  Has noticed he is more sleepy during the day.  Does not snore regularly.  No CP or SOB.  No HA or blurred vision.  No LE edema.  Has noticed some intermittent cardiac palpitations, otherwise no complaints. Denies feeling depressed.  Current Outpatient Prescriptions on File Prior to Visit  Medication Sig Dispense Refill  . albuterol (PROVENTIL HFA;VENTOLIN HFA) 108 (90 BASE) MCG/ACT inhaler Inhale 2 puffs into the lungs every 6 (six) hours as needed. 1 Inhaler 0  . citalopram (CELEXA) 40 MG tablet TAKE 1 TABLET (40 MG TOTAL) BY MOUTH DAILY. 30 tablet 3  . cyclobenzaprine (FLEXERIL) 10 MG tablet TAKE 1 TABLET (10 MG TOTAL) BY MOUTH AT BEDTIME AS NEEDED FOR MUSCLE SPASMS. 30 tablet 1  . losartan-hydrochlorothiazide (HYZAAR) 50-12.5 MG tablet TAKE 1 TABLET BY MOUTH DAILY. 90 tablet 1  . Multiple Vitamin (MULTIVITAMIN) tablet Take 1 tablet by mouth daily.    . Multiple Vitamins-Minerals (THERATRUM COMPLETE PO) Take by mouth.    Marland Kitchen omeprazole (PRILOSEC) 40 MG capsule TAKE ONE CAPSULE BY MOUTH DAILY 90 capsule 0  . triamcinolone (KENALOG) 0.1 % paste Use as directed 1 application in the mouth or throat 2 (two) times daily. 5 g 12   No current facility-administered medications on file prior to visit.    Allergies  Allergen Reactions  . Ace Inhibitors     Cough     Past Medical History  Diagnosis Date  . Anxiety   . Hypertension     No past surgical history on file.  Family History  Problem Relation Age of Onset  . Cancer Mother     breast  . Cancer Father     prostate    Social History   Social History  . Marital Status: Married    Spouse Name: N/A  . Number of Children: 0  . Years of  Education: N/A   Occupational History  . Not on file.   Social History Main Topics  . Smoking status: Former Smoker    Quit date: 11/12/1993  . Smokeless tobacco: Not on file  . Alcohol Use: Not on file  . Drug Use: Not on file  . Sexual Activity: Not on file   Other Topics Concern  . Not on file   Social History Narrative   The PMH, PSH, Social History, Family History, Medications, and allergies have been reviewed in [facility], and have been updated if relevant.   Review of Systems  Constitutional: Positive for fatigue. Negative for fever and diaphoresis.  HENT: Negative.   Respiratory: Negative.   Cardiovascular: Positive for palpitations. Negative for chest pain and leg swelling.  Gastrointestinal: Negative.   Musculoskeletal: Negative.   Skin: Negative.   Allergic/Immunologic: Negative.   Neurological: Negative.   Hematological: Negative.   Psychiatric/Behavioral: Negative.   All other systems reviewed and are negative.      Objective:    BP 118/72 mmHg  Pulse 80  Temp(Src) 98 F (36.7 C) (Oral)  Wt 232 lb 12 oz (105.575 kg)  SpO2 96%   Physical Exam  Constitutional: He is oriented to person, place, and time. He appears well-developed and well-nourished. No distress.  HENT:  Head: Normocephalic.  Eyes: Conjunctivae are normal.  Neck: Normal range of motion. Neck supple. No thyromegaly present.  Cardiovascular: Normal rate and regular rhythm.   Pulmonary/Chest: Effort normal and breath sounds normal.  Musculoskeletal: He exhibits no edema.  Neurological: He is alert and oriented to person, place, and time. No cranial nerve deficit.  Skin: Skin is warm and dry.  Psychiatric: He has a normal mood and affect. His behavior is normal. Judgment and thought content normal.  Nursing note and vitals reviewed.         Assessment & Plan:   Daytime somnolence No Follow-up on file.

## 2015-10-27 NOTE — Progress Notes (Signed)
Pre visit review using our clinic review tool, if applicable. No additional management support is needed unless otherwise documented below in the visit note. 

## 2015-10-27 NOTE — Addendum Note (Signed)
Addended by: Ellamae Sia on: 10/27/2015 11:59 AM   Modules accepted: Orders

## 2015-11-09 MED ORDER — VITAMIN D (ERGOCALCIFEROL) 1.25 MG (50000 UNIT) PO CAPS: 50000.0000 [IU] | ORAL_CAPSULE | ORAL | Status: DC

## 2015-11-09 NOTE — Addendum Note (Signed)
Addended by: Modena Nunnery on: 11/09/2015 04:46 PM   Modules accepted: Orders

## 2015-11-18 ENCOUNTER — Encounter: Payer: Self-pay | Admitting: Family Medicine

## 2015-11-18 ENCOUNTER — Ambulatory Visit (INDEPENDENT_AMBULATORY_CARE_PROVIDER_SITE_OTHER): Payer: Federal, State, Local not specified - PPO | Admitting: Family Medicine

## 2015-11-18 VITALS — BP 110/78 | HR 90 | Temp 98.0°F | Ht 69.0 in | Wt 226.5 lb

## 2015-11-18 DIAGNOSIS — A084 Viral intestinal infection, unspecified: Secondary | ICD-10-CM

## 2015-11-18 NOTE — Patient Instructions (Signed)
I think you have a GI virus  Drink fluids in sips - try to correct the dehydration gradually and that will help the fatigue and headache Tylenol is ok for fever if you need it  Gradually advance your diet as tolerated - we recommend BRAT diet- bananas/rice/apple sauce and toast Off work until Monday Get some rest

## 2015-11-18 NOTE — Progress Notes (Signed)
Pre visit review using our clinic review tool, if applicable. No additional management support is needed unless otherwise documented below in the visit note. 

## 2015-11-18 NOTE — Progress Notes (Signed)
Subjective:    Patient ID: Jonathan [name], male    DOB: 25-Feb-1971, 45 y.o.   MRN: [id number]  HPI Here with diarrhea since Tuesday   Last ate fish- but wife ate the same and did not get sick  Can drink water and then 15 minutes later has diarrhea  Can go up to every hour  Yesterday- pure liquid stool No stools yet today - no cramping   Is worn out   Drank 1/2 bottle of water   No n/v   Has had abd gurgling before he has bm but no abd pain or cramping No blood in stool  achey- has not checked temp  Some sweats at night and chills Has not checked temp   Starting to improve just a little bit   Patient Active Problem List   Diagnosis Date Noted  . Daytime somnolence 10/27/2015  . Wart of scalp 05/04/2015  . 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 RLS (restless legs syndrome) 09/27/2014  . Trigger finger 02/12/2012  . Alcohol dependence ([place]) 09/13/2010  . TRANSAMINASES, SERUM, ELEVATED 09/13/2010  . HLD (hyperlipidemia) 12/16/2009  . GERD 10/14/2009  . INTRINSIC ASTHMA, UNSPECIFIED 09/09/2009  . Anxiety state 08/12/2009  . HTN (hypertension) 08/12/2009   Past Medical History  Diagnosis Date  . Anxiety   . Hypertension    No past surgical history on file. Social History  Substance Use Topics  . Smoking status: Former Smoker    Quit date: 11/12/1993  . Smokeless tobacco: None  . Alcohol Use: None   Family History  Problem Relation Age of Onset  . Cancer Mother     breast  . Cancer Father     prostate   Allergies  Allergen Reactions  . Ace Inhibitors     Cough    Current Outpatient Prescriptions on File Prior to Visit  Medication Sig Dispense Refill  . albuterol (PROVENTIL HFA;VENTOLIN HFA) 108 (90 BASE) MCG/ACT inhaler Inhale 2 puffs into the lungs every 6 (six) hours as needed. 1 Inhaler 0  . citalopram (CELEXA) 40 MG tablet TAKE 1 TABLET (40 MG TOTAL) BY MOUTH DAILY. 30 tablet 3  . cyclobenzaprine (FLEXERIL) 10 MG tablet TAKE 1 TABLET (10 MG  TOTAL) BY MOUTH AT BEDTIME AS NEEDED FOR MUSCLE SPASMS. 30 tablet 1  . losartan-hydrochlorothiazide (HYZAAR) 50-12.5 MG tablet TAKE 1 TABLET BY MOUTH DAILY. 90 tablet 1  . Multiple Vitamin (MULTIVITAMIN) tablet Take 1 tablet by mouth daily.    . Multiple Vitamins-Minerals (THERATRUM COMPLETE PO) Take by mouth.    Marland Kitchen omeprazole (PRILOSEC) 40 MG capsule TAKE ONE CAPSULE BY MOUTH DAILY 90 capsule 0  . triamcinolone (KENALOG) 0.1 % paste Use as directed 1 application in the mouth or throat 2 (two) times daily. 5 g 12  . Vitamin D, Ergocalciferol, (DRISDOL) 50000 units CAPS capsule Take 1 capsule (50,000 Units total) by mouth every 7 (seven) days. 6 capsule 0   No current facility-administered medications on file prior to visit.      Review of Systems Review of Systems  Constitutional: Negative for fever, appetite change, and unexpected weight change.  Eyes: Negative for pain and visual disturbance.  Respiratory: Negative for cough and shortness of breath.   Cardiovascular: Negative for cp or palpitations    Gastrointestinal: Negative for nausea, vomiting or blood in stool Genitourinary: Negative for urgency and frequency.  Skin: Negative for pallor or rash   Neurological: Negative for weakness, light-headedness, numbness and pos for headaches.  Hematological: Negative for  adenopathy. Does not bruise/bleed easily.  Psychiatric/Behavioral: Negative for dysphoric mood. The patient is not nervous/anxious.         Objective:   Physical Exam  Constitutional: He appears well-developed and well-nourished. No distress.  obese and well appearing   HENT:  Head: Normocephalic and atraumatic.  Mouth/Throat: Oropharynx is clear and moist.  Tongue is slightly dry  Eyes: Conjunctivae and EOM are normal. Pupils are equal, round, and reactive to light. No scleral icterus.  Neck: Normal range of motion. Neck supple.  Cardiovascular: Normal rate, regular rhythm and normal heart sounds.     Pulmonary/Chest: Effort normal and breath sounds normal. No respiratory distress. He has no wheezes. He has no rales.  Abdominal: Soft. Bowel sounds are normal. He exhibits no distension and no mass. There is no tenderness. There is no rebound and no guarding.  Lymphadenopathy:    He has no cervical adenopathy.  Neurological: He is alert.  Skin: Skin is warm and dry. No erythema. No pallor.  Psychiatric: He has a normal mood and affect.          Assessment & Plan:   Problem List Items Addressed This Visit      Digestive   Viral gastroenteritis - Primary    With diarrhea and symptoms of fever -improved today  Disc imp of hydration- gradual /and what to drink -suspect this will help headache and malaise  Then gradual adv of diet from BRAT to regular  Update if not starting to improve in a week or if worsening

## 2015-11-20 NOTE — Assessment & Plan Note (Signed)
With diarrhea and symptoms of fever -improved today  Disc imp of hydration- gradual /and what to drink -suspect this will help headache and malaise  Then gradual adv of diet from BRAT to regular  Update if not starting to improve in a week or if worsening

## 2015-11-21 ENCOUNTER — Other Ambulatory Visit: Payer: Self-pay | Admitting: *Deleted

## 2015-11-21 MED ORDER — CITALOPRAM HYDROBROMIDE 40 MG PO TABS: ORAL_TABLET | ORAL | Status: DC

## 2015-12-19 ENCOUNTER — Other Ambulatory Visit: Payer: Self-pay | Admitting: Family Medicine

## 2016-01-19 ENCOUNTER — Other Ambulatory Visit: Payer: Self-pay | Admitting: Family Medicine

## 2016-02-27 ENCOUNTER — Encounter: Payer: Self-pay | Admitting: Family Medicine

## 2016-02-27 ENCOUNTER — Ambulatory Visit (INDEPENDENT_AMBULATORY_CARE_PROVIDER_SITE_OTHER): Payer: Federal, State, Local not specified - PPO | Admitting: Family Medicine

## 2016-02-27 VITALS — BP 118/76 | HR 80 | Temp 98.3°F | Ht 67.25 in | Wt 230.8 lb

## 2016-02-27 DIAGNOSIS — Z23 Encounter for immunization: Secondary | ICD-10-CM

## 2016-02-27 DIAGNOSIS — E785 Hyperlipidemia, unspecified: Secondary | ICD-10-CM | POA: Diagnosis not present

## 2016-02-27 DIAGNOSIS — I10 Essential (primary) hypertension: Secondary | ICD-10-CM | POA: Diagnosis not present

## 2016-02-27 DIAGNOSIS — F1021 Alcohol dependence, in remission: Secondary | ICD-10-CM | POA: Diagnosis not present

## 2016-02-27 DIAGNOSIS — F411 Generalized anxiety disorder: Secondary | ICD-10-CM

## 2016-02-27 DIAGNOSIS — S0101XA Laceration without foreign body of scalp, initial encounter: Secondary | ICD-10-CM | POA: Insufficient documentation

## 2016-02-27 DIAGNOSIS — Z8639 Personal history of other endocrine, nutritional and metabolic disease: Secondary | ICD-10-CM

## 2016-02-27 LAB — COMPREHENSIVE METABOLIC PANEL
ALK PHOS: 62 U/L (ref 39–117)
ALT: 22 U/L (ref 0–53)
AST: 18 U/L (ref 0–37)
Albumin: 4.5 g/dL (ref 3.5–5.2)
BUN: 16 mg/dL (ref 6–23)
CO2: 28 meq/L (ref 19–32)
Calcium: 9.8 mg/dL (ref 8.4–10.5)
Chloride: 104 mEq/L (ref 96–112)
Creatinine, Ser: 0.95 mg/dL (ref 0.40–1.50)
GFR: 91.15 mL/min (ref >60.00)
GLUCOSE: 103 mg/dL — AB (ref 70–99)
POTASSIUM: 4 meq/L (ref 3.5–5.1)
SODIUM: 141 meq/L (ref 135–145)
TOTAL PROTEIN: 7.3 g/dL (ref 6.0–8.3)
Total Bilirubin: 0.5 mg/dL (ref 0.2–1.2)

## 2016-02-27 LAB — LIPID PANEL
CHOL/HDL RATIO: 5
Cholesterol: 177 mg/dL (ref 0–200)
HDL: 33.7 mg/dL — ABNORMAL LOW (ref >39.00)
LDL CALC: 115 mg/dL — AB (ref 0–99)
NonHDL: 143.4
Triglycerides: 141 mg/dL (ref 0.0–149.0)
VLDL: 28.2 mg/dL (ref 0.0–40.0)

## 2016-02-27 LAB — CBC WITH DIFFERENTIAL/PLATELET
BASOS ABS: 0 10*3/uL (ref 0.0–0.1)
Basophils Relative: 0.1 % (ref 0.0–3.0)
EOS ABS: 0.1 10*3/uL (ref 0.0–0.7)
Eosinophils Relative: 1.9 % (ref 0.0–5.0)
HEMATOCRIT: 46.3 % (ref 39.0–52.0)
HEMOGLOBIN: 15.8 g/dL (ref 13.0–17.0)
LYMPHS PCT: 25.1 % (ref 12.0–46.0)
Lymphs Abs: 1.9 10*3/uL (ref 0.7–4.0)
MCHC: 34.1 g/dL (ref 30.0–36.0)
MCV: 83.7 fl (ref 78.0–100.0)
Monocytes Absolute: 0.6 10*3/uL (ref 0.1–1.0)
Monocytes Relative: 7.4 % (ref 3.0–12.0)
Neutro Abs: 4.9 10*3/uL (ref 1.4–7.7)
Neutrophils Relative %: 65.5 % (ref 43.0–77.0)
PLATELETS: 194 10*3/uL (ref 150.0–400.0)
RBC: 5.54 Mil/uL (ref 4.22–5.81)
RDW: 13.2 % (ref 11.5–15.5)
WBC: 7.5 10*3/uL (ref 4.0–10.5)

## 2016-02-27 LAB — VITAMIN D 25 HYDROXY (VIT D DEFICIENCY, FRACTURES): VITD: 24.29 ng/mL — ABNORMAL LOW (ref 30.00–100.00)

## 2016-02-27 MED ORDER — DOXYCYCLINE HYCLATE 100 MG PO TABS: 100.0000 mg | ORAL_TABLET | Freq: Two times a day (BID) | ORAL | Status: DC

## 2016-02-27 NOTE — Assessment & Plan Note (Signed)
New- treat with 10 day course of doxycyline 100 mg twice daily. Call or return to clinic prn if these symptoms worsen or fail to improve as anticipated. The patient indicates understanding of these issues and agrees with the plan.

## 2016-02-27 NOTE — Patient Instructions (Signed)
Great to see you. Take doxycyline as directed- 1 tablet twice daily x 10 days.  Keep me updated.  I will call you with your lab results.

## 2016-02-27 NOTE — Progress Notes (Signed)
Pre visit review using our clinic review tool, if applicable. No additional management support is needed unless otherwise documented below in the visit note. 

## 2016-02-27 NOTE — Assessment & Plan Note (Signed)
2 years sober and doing well. Wife is very supportive.

## 2016-02-27 NOTE — Assessment & Plan Note (Signed)
Well controlled. No changes made today. 

## 2016-02-27 NOTE — Assessment & Plan Note (Signed)
Well controlled on current dose of celexa. No changes made today. 

## 2016-02-27 NOTE — Progress Notes (Signed)
Subjective:   Patient ID: Jonathan [name], male    DOB: [birth year], 45 y.o.   MRN: [id number]  Jonathan [name] is a pleasant 45 y.o. year old male who presents to clinic today with check bump on head  on 02/27/2016  HPI:  Jonathan [name] to [employer] for alcohol addiction 2 years ago.  Has been clean and sober since.    Feels so much better. Has not attended [place] meeting yet and feels he does ok with his cravings and does not need to attend them.  "I never want to drink again.  I was so sick."  Celexa 40 mg daily has been working well for his anxiety.   HTN- has been controlled on current dose of Hyzaar. Denies HA, blurred vision, CP or SOB.  Bump on scalp- noticed it a week ago.  Thinks maybe he got scraped with a tree branch.  It is painful. Advil has helped with the pain.  Not bleeding that he is aware of.  He feels a bump which is concerning him.   Patient Active Problem List   Diagnosis Date Noted  . Scalp laceration 02/27/2016  . Daytime somnolence 10/27/2015  . 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 RLS (restless legs syndrome) 09/27/2014  . Alcohol dependence ([place]) 09/13/2010  . TRANSAMINASES, SERUM, ELEVATED 09/13/2010  . HLD (hyperlipidemia) 12/16/2009  . GERD 10/14/2009  . INTRINSIC ASTHMA, UNSPECIFIED 09/09/2009  . Anxiety state 08/12/2009  . HTN (hypertension) 08/12/2009   Past Medical History  Diagnosis Date  . Anxiety   . Hypertension    No past surgical history on file. Social History  Substance Use Topics  . Smoking status: Former Smoker    Quit date: 11/12/1993  . Smokeless tobacco: Current User    Types: Chew  . Alcohol Use: No   Family History  Problem Relation Age of Onset  . Cancer Mother     breast  . Cancer Father     prostate   Allergies  Allergen Reactions  . Ace Inhibitors     Cough    Current Outpatient Prescriptions on File Prior to Visit  Medication Sig Dispense Refill  . albuterol (PROVENTIL HFA;VENTOLIN HFA) 108 (90  BASE) MCG/ACT inhaler Inhale 2 puffs into the lungs every 6 (six) hours as needed. 1 Inhaler 0  . citalopram (CELEXA) 40 MG tablet TAKE 1 TABLET (40 MG TOTAL) BY MOUTH DAILY. 30 tablet 3  . cyclobenzaprine (FLEXERIL) 10 MG tablet TAKE 1 TABLET (10 MG TOTAL) BY MOUTH AT BEDTIME AS NEEDED FOR MUSCLE SPASMS. 30 tablet 1  . losartan-hydrochlorothiazide (HYZAAR) 50-12.5 MG tablet TAKE 1 TABLET BY MOUTH DAILY. 90 tablet 1  . Multiple Vitamin (MULTIVITAMIN) tablet Take 1 tablet by mouth daily.    . Multiple Vitamins-Minerals (THERATRUM COMPLETE PO) Take by mouth.    Marland Kitchen omeprazole (PRILOSEC) 40 MG capsule TAKE ONE CAPSULE BY MOUTH DAILY 90 capsule 0   No current facility-administered medications on file prior to visit.   The PMH, PSH, Social History, Family History, Medications, and allergies have been reviewed in [facility], and have been updated if relevant.   Review of Systems  Constitutional: Negative.   HENT: Negative.   Cardiovascular: Negative.   Gastrointestinal: Negative.   Genitourinary: Negative.   Musculoskeletal: Negative.   Skin: Positive for wound.  Neurological: Negative.   Hematological: Negative.   Psychiatric/Behavioral: Negative.   All other systems reviewed and are negative.     Objective:    BP 118/76 mmHg  Pulse 80  Temp(Src) 98.3 F (36.8 C) (Oral)  Ht 5' 7.25" (1.708 m)  Wt 230 lb 12 oz (104.668 kg)  BMI 35.88 kg/m2 Wt Readings from Last 3 Encounters:  02/27/16 230 lb 12 oz (104.668 kg)  11/18/15 226 lb 8 oz (102.74 kg)  10/27/15 232 lb 12 oz (105.575 kg)     Physical Exam General:  overweght male in NAD Eyes:  PERRL Ears:  External ear exam shows no significant lesions or deformities.  Otoscopic examination reveals clear canals, tympanic membranes are intact bilaterally without bulging, retraction, inflammation or discharge. Hearing is grossly normal bilaterally. Nose:  External nasal examination shows no deformity or inflammation. Nasal mucosa are pink and  moist without lesions or exudates. Mouth:  Oral mucosa and oropharynx without lesions or exudates.  Teeth in good repair. Neck:  no carotid bruit or thyromegaly no cervical or supraclavicular lymphadenopathy  Lungs:  Normal respiratory effort, chest expands symmetrically. Lungs are clear to auscultation, no crackles or wheezes. Heart:  Normal rate and regular rhythm. S1 and S2 normal without gallop, murmur, click, rub or other extra sounds. Abdomen:  Bowel sounds positive,abdomen soft and non-tender without masses, organomegaly or hernias noted. Pulses:  R and L posterior tibial pulses are full and equal bilaterally  Extremities:  no edema  Skin:  Skin laceration to top of left scalp with underlying erythema, no fluctuant      Assessment & Plan:   HLD (hyperlipidemia)  Essential hypertension  Alcohol dependence in remission (HCC)  Anxiety state  Scalp laceration, initial encounter No Follow-up on file.

## 2016-03-05 ENCOUNTER — Other Ambulatory Visit: Payer: Self-pay | Admitting: Family Medicine

## 2016-03-22 ENCOUNTER — Other Ambulatory Visit: Payer: Self-pay | Admitting: Family Medicine

## 2016-03-22 NOTE — Telephone Encounter (Signed)
Pt requesting a refill. Last refill was on 11/21/15 +3 refills. Pt was last here on 02/27/16 to "check a bump on head". Okay to refill?

## 2016-03-29 DIAGNOSIS — K080 Exfoliation of teeth due to systemic causes: Secondary | ICD-10-CM | POA: Diagnosis not present

## 2016-04-24 ENCOUNTER — Other Ambulatory Visit: Payer: Self-pay | Admitting: Family Medicine

## 2016-07-25 ENCOUNTER — Other Ambulatory Visit: Payer: Self-pay | Admitting: Family Medicine

## 2016-07-26 ENCOUNTER — Other Ambulatory Visit: Payer: Self-pay | Admitting: Family Medicine

## 2016-10-18 DIAGNOSIS — K080 Exfoliation of teeth due to systemic causes: Secondary | ICD-10-CM | POA: Diagnosis not present

## 2016-10-19 ENCOUNTER — Other Ambulatory Visit: Payer: Self-pay | Admitting: Family Medicine

## 2016-11-20 ENCOUNTER — Other Ambulatory Visit: Payer: Self-pay | Admitting: Family Medicine

## 2016-12-20 DIAGNOSIS — J9811 Atelectasis: Secondary | ICD-10-CM | POA: Diagnosis not present

## 2016-12-20 DIAGNOSIS — J209 Acute bronchitis, unspecified: Secondary | ICD-10-CM | POA: Diagnosis not present

## 2016-12-24 ENCOUNTER — Encounter: Payer: Self-pay | Admitting: Family Medicine

## 2016-12-24 ENCOUNTER — Ambulatory Visit (INDEPENDENT_AMBULATORY_CARE_PROVIDER_SITE_OTHER): Payer: Federal, State, Local not specified - PPO | Admitting: Family Medicine

## 2016-12-24 DIAGNOSIS — J40 Bronchitis, not specified as acute or chronic: Secondary | ICD-10-CM

## 2016-12-24 MED ORDER — CYCLOBENZAPRINE HCL 10 MG PO TABS: ORAL_TABLET | ORAL | 1 refills | Status: DC

## 2016-12-24 NOTE — Progress Notes (Signed)
Subjective:   Patient ID: Jonathan [name], male    DOB: [birth year], 46 y.o.   MRN: [id number]  Jonathan [name] is a pleasant 46 y.o. year old male who presents to clinic today with Follow-up  on 12/24/2016  HPI:  Urgent care follow up- woke up 8 days ago, with ear pain, cough, myalgias.  Went to urgent on 12/20/16. Notes reviewed.  Brings in notes today.  CXR done and showed atelectasis.  Given albuterol-used this as needed.  Also given doxycycline but was told not to take until he has seen me.  Overall feels much better. Still coughing a lot.  Afebrile.  Ribs are still sore.     Current Outpatient Prescriptions on File Prior to Visit  Medication Sig Dispense Refill  . albuterol (PROVENTIL HFA;VENTOLIN HFA) 108 (90 BASE) MCG/ACT inhaler Inhale 2 puffs into the lungs every 6 (six) hours as needed. 1 Inhaler 0  . citalopram (CELEXA) 40 MG tablet TAKE 1 TABLET (40 MG TOTAL) BY MOUTH DAILY. 90 tablet 1  . cyclobenzaprine (FLEXERIL) 10 MG tablet TAKE 1 TABLET (10 MG TOTAL) BY MOUTH AT BEDTIME AS NEEDED FOR MUSCLE SPASMS. 30 tablet 1  . losartan-hydrochlorothiazide (HYZAAR) 50-12.5 MG tablet TAKE 1 TABLET BY MOUTH DAILY. 90 tablet 0  . Multiple Vitamin (MULTIVITAMIN) tablet Take 1 tablet by mouth daily.    . Multiple Vitamins-Minerals (THERATRUM COMPLETE PO) Take by mouth.    Marland Kitchen omeprazole (PRILOSEC) 40 MG capsule TAKE 1 CAPSULE (40 MG TOTAL) BY MOUTH DAILY. COMPLETE PHYSICAL EXAM REQUIRED FOR ADDITIONAL REFILLS 90 capsule 0   No current facility-administered medications on file prior to visit.     Allergies  Allergen Reactions  . Ace Inhibitors     Cough     Past Medical History:  Diagnosis Date  . Anxiety   . Hypertension     No past surgical history on file.  Family History  Problem Relation Age of Onset  . Cancer Mother     breast  . Cancer Father     prostate    Social History   Social History  . Marital status: Married    Spouse name: N/A  .  Number of children: 0  . Years of education: N/A   Occupational History  . Not on file.   Social History Main Topics  . Smoking status: Former Smoker    Quit date: 11/12/1993  . Smokeless tobacco: Current User    Types: Chew  . Alcohol use No  . Drug use: Unknown  . Sexual activity: Not on file   Other Topics Concern  . Not on file   Social History Narrative  . No narrative on file   The PMH, PSH, Social History, Family History, Medications, and allergies have been reviewed in [facility], and have been updated if relevant.   Review of Systems  Constitutional: Negative.   HENT: Positive for congestion. Negative for ear discharge, ear pain, facial swelling, rhinorrhea, sinus pain, trouble swallowing and voice change.   Respiratory: Positive for cough and shortness of breath. Negative for choking, chest tightness, wheezing and stridor.   Cardiovascular: Negative.   Gastrointestinal: Negative.   Endocrine: Negative.   Musculoskeletal: Positive for arthralgias.  Allergic/Immunologic: Negative.   Neurological: Negative.   Psychiatric/Behavioral: Negative.   All other systems reviewed and are negative.      Objective:    BP 132/74   Pulse 90   Temp 98.4 F (36.9 C) (Oral)   Wt 226 lb (102.5  kg)   SpO2 98%   BMI 35.13 kg/m    Physical Exam  Constitutional: He is oriented to person, place, and time. He appears well-developed and well-nourished. No distress.  HENT:  Head: Normocephalic and atraumatic.  Eyes: Conjunctivae are normal.  Cardiovascular: Normal rate and regular rhythm.   Pulmonary/Chest: Effort normal and breath sounds normal.  Musculoskeletal: Normal range of motion. He exhibits no edema.  Neurological: He is alert and oriented to person, place, and time. No cranial nerve deficit.  Skin: Skin is warm and dry. He is not diaphoretic.  Psychiatric: He has a normal mood and affect. His behavior is normal. Judgment and thought content normal.  Vitals  reviewed.         Assessment & Plan:   Bronchitis No Follow-up on file.

## 2016-12-24 NOTE — Progress Notes (Signed)
Pre visit review using our clinic review tool, if applicable. No additional management support is needed unless otherwise documented below in the visit note. 

## 2016-12-24 NOTE — Patient Instructions (Signed)
Great to see you.   Let's hold off on taking doxycyline yet.  Keep me updated.

## 2016-12-24 NOTE — Assessment & Plan Note (Signed)
Lung exam and history reassuring. Advised not starting doxycyline yet as he appears to be getting better. He will keep me updated this week. Call or return to clinic prn if these symptoms worsen or fail to improve as anticipated. The patient indicates understanding of these issues and agrees with the plan.

## 2017-02-03 ENCOUNTER — Other Ambulatory Visit: Payer: Self-pay | Admitting: Family Medicine

## 2017-03-02 ENCOUNTER — Other Ambulatory Visit: Payer: Self-pay | Admitting: Family Medicine

## 2017-03-07 ENCOUNTER — Other Ambulatory Visit: Payer: Self-pay | Admitting: Family Medicine

## 2017-03-21 ENCOUNTER — Encounter: Payer: Self-pay | Admitting: Family Medicine

## 2017-03-21 ENCOUNTER — Ambulatory Visit (INDEPENDENT_AMBULATORY_CARE_PROVIDER_SITE_OTHER): Payer: Federal, State, Local not specified - PPO | Admitting: Family Medicine

## 2017-03-21 VITALS — BP 104/70 | HR 87 | Temp 98.4°F | Wt 227.0 lb

## 2017-03-21 DIAGNOSIS — G2581 Restless legs syndrome: Secondary | ICD-10-CM | POA: Diagnosis not present

## 2017-03-21 DIAGNOSIS — F329 Major depressive disorder, single episode, unspecified: Secondary | ICD-10-CM | POA: Insufficient documentation

## 2017-03-21 DIAGNOSIS — I10 Essential (primary) hypertension: Secondary | ICD-10-CM

## 2017-03-21 DIAGNOSIS — E785 Hyperlipidemia, unspecified: Secondary | ICD-10-CM | POA: Diagnosis not present

## 2017-03-21 DIAGNOSIS — F419 Anxiety disorder, unspecified: Secondary | ICD-10-CM

## 2017-03-21 DIAGNOSIS — F32A Depression, unspecified: Secondary | ICD-10-CM | POA: Insufficient documentation

## 2017-03-21 DIAGNOSIS — Z125 Encounter for screening for malignant neoplasm of prostate: Secondary | ICD-10-CM

## 2017-03-21 DIAGNOSIS — F339 Major depressive disorder, recurrent, unspecified: Secondary | ICD-10-CM

## 2017-03-21 LAB — COMPREHENSIVE METABOLIC PANEL
ALBUMIN: 4.9 g/dL (ref 3.5–5.2)
ALK PHOS: 64 U/L (ref 39–117)
ALT: 22 U/L (ref 0–53)
AST: 19 U/L (ref 0–37)
BILIRUBIN TOTAL: 0.7 mg/dL (ref 0.2–1.2)
BUN: 10 mg/dL (ref 6–23)
CALCIUM: 9.7 mg/dL (ref 8.4–10.5)
CO2: 31 mEq/L (ref 19–32)
CREATININE: 0.99 mg/dL (ref 0.40–1.50)
Chloride: 103 mEq/L (ref 96–112)
GFR: 86.5 mL/min (ref >60.00)
Glucose, Bld: 103 mg/dL — ABNORMAL HIGH (ref 70–99)
Potassium: 4.3 mEq/L (ref 3.5–5.1)
Sodium: 141 mEq/L (ref 135–145)
Total Protein: 7.5 g/dL (ref 6.0–8.3)

## 2017-03-21 LAB — LIPID PANEL
CHOLESTEROL: 174 mg/dL (ref 0–200)
HDL: 36.2 mg/dL — ABNORMAL LOW (ref >39.00)
LDL CALC: 120 mg/dL — AB (ref 0–99)
NonHDL: 137.3
TRIGLYCERIDES: 89 mg/dL (ref 0.0–149.0)
Total CHOL/HDL Ratio: 5
VLDL: 17.8 mg/dL (ref 0.0–40.0)

## 2017-03-21 MED ORDER — CYCLOBENZAPRINE HCL 10 MG PO TABS: ORAL_TABLET | ORAL | 1 refills | Status: DC

## 2017-03-21 MED ORDER — OMEPRAZOLE 40 MG PO CPDR: 40.0000 mg | DELAYED_RELEASE_CAPSULE | Freq: Every day | ORAL | 0 refills | Status: DC

## 2017-03-21 NOTE — Assessment & Plan Note (Signed)
Normotensive 

## 2017-03-21 NOTE — Assessment & Plan Note (Signed)
Diet controlled since he stopped drinking ETOH. Check lipid panel today. The patient indicates understanding of these issues and agrees with the plan.

## 2017-03-21 NOTE — Patient Instructions (Signed)
Great to see you. We will call you with your lab results and you can view them online.  

## 2017-03-21 NOTE — Assessment & Plan Note (Signed)
Stable on current dose of celexa. No changes made. 

## 2017-03-21 NOTE — Progress Notes (Signed)
Subjective:   Patient ID: Jonathan [name], male    DOB: 04-24-1971, 46 y.o.   MRN: [id number]  Jonathan [name] is a pleasant 46 y.o. year old male who presents to clinic today with Hyperlipidemia (Came fasting); Depression; and Restless Leg  on 03/21/2017  HPI:  HLD-  Due for labs.  Not currently taking a statin. TG improved dramatically once he quit drinking ETOH.  He is fasting today.  Lab Results  Component Value Date   CHOL 177 02/27/2016   HDL 33.70 (L) 02/27/2016   LDLCALC 115 (H) 02/27/2016   LDLDIRECT 133.2 09/27/2014   TRIG 141.0 02/27/2016   CHOLHDL 5 02/27/2016    Does still feel reflux if he does not take his PPI.  Feels depression and anxiety have been well controlled on current dose of celexa.  Current Outpatient Prescriptions on File Prior to Visit  Medication Sig Dispense Refill  . albuterol (PROVENTIL HFA;VENTOLIN HFA) 108 (90 BASE) MCG/ACT inhaler Inhale 2 puffs into the lungs every 6 (six) hours as needed. 1 Inhaler 0  . citalopram (CELEXA) 40 MG tablet TAKE 1 TABLET (40 MG TOTAL) BY MOUTH DAILY. 90 tablet 0  . cyclobenzaprine (FLEXERIL) 10 MG tablet TAKE 1 TABLET (10 MG TOTAL) BY MOUTH AT BEDTIME AS NEEDED FOR MUSCLE SPASMS. 30 tablet 1  . losartan-hydrochlorothiazide (HYZAAR) 50-12.5 MG tablet Take 1 tablet by mouth daily. NEEDS OFFICE VISIT BEFORE MORE REFILLS ARE GIVEN 90 tablet 0  . Multiple Vitamin (MULTIVITAMIN) tablet Take 1 tablet by mouth daily.    Marland Kitchen omeprazole (PRILOSEC) 40 MG capsule TAKE 1 CAPSULE (40 MG TOTAL) BY MOUTH DAILY. COMPLETE PHYSICAL EXAM REQUIRED FOR ADDITIONAL REFILLS 90 capsule 0   No current facility-administered medications on file prior to visit.     Allergies  Allergen Reactions  . Ace Inhibitors     Cough     Past Medical History:  Diagnosis Date  . Anxiety   . Hypertension     No past surgical history on file.  Family History  Problem Relation Age of Onset  . Cancer Mother        breast  . Cancer  Father        prostate    Social History   Social History  . Marital status: Married    Spouse name: N/A  . Number of children: 0  . Years of education: N/A   Occupational History  . Not on file.   Social History Main Topics  . Smoking status: Former Smoker    Quit date: 11/12/1993  . Smokeless tobacco: Current User    Types: Chew  . Alcohol use No  . Drug use: Unknown  . Sexual activity: Not on file   Other Topics Concern  . Not on file   Social History Narrative  . No narrative on file   The PMH, PSH, Social History, Family History, Medications, and allergies have been reviewed in [facility], and have been updated if relevant.   Review of Systems  Constitutional: Negative.   HENT: Negative.   Respiratory: Negative.   Cardiovascular: Negative.   Gastrointestinal: Negative.   Musculoskeletal: Negative.   Neurological: Negative.   Hematological: Negative.   Psychiatric/Behavioral: Negative.   All other systems reviewed and are negative.      Objective:    BP 104/70 (BP Location: Left Arm, Patient Position: Sitting, Cuff Size: Large)   Pulse 87   Temp 98.4 F (36.9 C) (Oral)   Wt 227 lb (103 kg)  SpO2 95%   BMI 35.29 kg/m    Physical Exam  Constitutional: He is oriented to person, place, and time. He appears well-developed and well-nourished. No distress.  HENT:  Head: Normocephalic and atraumatic.  Eyes: Conjunctivae are normal.  Cardiovascular: Normal rate and regular rhythm.   Pulmonary/Chest: Effort normal.  Neurological: He is alert and oriented to person, place, and time. No cranial nerve deficit.  Skin: Skin is warm and dry. He is not diaphoretic.  Psychiatric: He has a normal mood and affect. His behavior is normal. Judgment and thought content normal.  Nursing note and vitals reviewed.         Assessment & Plan:   Hyperlipidemia, unspecified hyperlipidemia type - Plan: Lipid panel, Comprehensive metabolic panel  Essential  hypertension  RLS (restless legs syndrome)  Depression, recurrent (HCC) No Follow-up on file.

## 2017-05-05 ENCOUNTER — Other Ambulatory Visit: Payer: Self-pay | Admitting: Family Medicine

## 2017-05-09 DIAGNOSIS — K080 Exfoliation of teeth due to systemic causes: Secondary | ICD-10-CM | POA: Diagnosis not present

## 2017-05-28 ENCOUNTER — Other Ambulatory Visit: Payer: Self-pay | Admitting: Family Medicine

## 2017-05-30 ENCOUNTER — Ambulatory Visit (INDEPENDENT_AMBULATORY_CARE_PROVIDER_SITE_OTHER): Payer: Federal, State, Local not specified - PPO | Admitting: Internal Medicine

## 2017-05-30 ENCOUNTER — Encounter: Payer: Self-pay | Admitting: Internal Medicine

## 2017-05-30 VITALS — BP 116/82 | HR 83 | Temp 98.3°F | Wt 230.0 lb

## 2017-05-30 DIAGNOSIS — M542 Cervicalgia: Secondary | ICD-10-CM

## 2017-05-30 NOTE — Progress Notes (Signed)
Subjective:    Patient ID: Jonathan [name], male    DOB: [birth year], 46 y.o.   MRN: [id number]  HPI  Pt presents to the clinic today with c/o neck pain. He reports this started 3 months ago. He described the pain as more of a stiffness. It seems worse when he rotates his head to the left and right, not up and down. The pain can radiate to his upper back. He denies numbness, tingling or known injury to the area. He has not tried anything OTC for this. He reports he has a RX for [employer] which he has used for leg pain in the past, but he has not tried it for this.  Review of Systems      Past Medical History:  Diagnosis Date  . Anxiety   . Hypertension     Current Outpatient Prescriptions  Medication Sig Dispense Refill  . albuterol (PROVENTIL HFA;VENTOLIN HFA) 108 (90 BASE) MCG/ACT inhaler Inhale 2 puffs into the lungs every 6 (six) hours as needed. 1 Inhaler 0  . citalopram (CELEXA) 40 MG tablet TAKE 1 TABLET (40 MG TOTAL) BY MOUTH DAILY. 90 tablet 3  . cyclobenzaprine (FLEXERIL) 10 MG tablet TAKE 1 TABLET (10 MG TOTAL) BY MOUTH AT BEDTIME AS NEEDED FOR MUSCLE SPASMS. 30 tablet 1  . losartan-hydrochlorothiazide (HYZAAR) 50-12.5 MG tablet Take 1 tablet by mouth daily. 90 tablet 3  . Multiple Vitamin (MULTIVITAMIN) tablet Take 1 tablet by mouth daily.    Marland Kitchen omeprazole (PRILOSEC) 40 MG capsule Take 1 capsule (40 mg total) by mouth daily. 90 capsule 0   No current facility-administered medications for this visit.     Allergies  Allergen Reactions  . Ace Inhibitors     Cough     Family History  Problem Relation Age of Onset  . Cancer Mother        breast  . Cancer Father        prostate    Social History   Social History  . Marital status: Married    Spouse name: N/A  . Number of children: 0  . Years of education: N/A   Occupational History  . Not on file.   Social History Main Topics  . Smoking status: Former Smoker    Quit date: 11/12/1993  . Smokeless  tobacco: Current User    Types: Chew  . Alcohol use No  . Drug use: Unknown  . Sexual activity: Not on file   Other Topics Concern  . Not on file   Social History Narrative  . No narrative on file     Constitutional: Denies fever, malaise, fatigue, headache or abrupt weight changes.  Musculoskeletal: Pt reports neck pain. Denies decrease in range of motion, difficulty with gait, or joint pain and swelling.  Neurological: Denies dizziness, difficulty with memory, difficulty with speech or problems with balance and coordination.    No other specific complaints in a complete review of systems (except as listed in HPI above).  Objective:   Physical Exam   BP 116/82   Pulse 83   Temp 98.3 F (36.8 C) (Oral)   Wt 230 lb (104.3 kg)   SpO2 98%   BMI 35.76 kg/m  Wt Readings from Last 3 Encounters:  05/30/17 230 lb (104.3 kg)  03/21/17 227 lb (103 kg)  12/24/16 226 lb (102.5 kg)    General: Appears his stated age, obese in NAD. Musculoskeletal: Normal flexion, extension and rotation to the left of the cervical spine.  Decreased rotation to the right. No pain with palpation over the cervical spine. Tension noted of the bilateral paracervical muscles R>L.  Neurological: Alert and oriented.    BMET    Component Value Date/Time   NA 141 03/21/2017 1044   K 4.3 03/21/2017 1044   CL 103 03/21/2017 1044   CO2 31 03/21/2017 1044   GLUCOSE 103 (H) 03/21/2017 1044   BUN 10 03/21/2017 1044   CREATININE 0.99 03/21/2017 1044   CALCIUM 9.7 03/21/2017 1044   GFRNONAA 95.90 09/13/2010 1103    Lipid Panel     Component Value Date/Time   CHOL 174 03/21/2017 1044   TRIG 89.0 03/21/2017 1044   HDL 36.20 (L) 03/21/2017 1044   CHOLHDL 5 03/21/2017 1044   VLDL 17.8 03/21/2017 1044   LDLCALC 120 (H) 03/21/2017 1044    CBC    Component Value Date/Time   WBC 7.5 02/27/2016 1032   RBC 5.54 02/27/2016 1032   HGB 15.8 02/27/2016 1032   HCT 46.3 02/27/2016 1032   PLT 194.0  02/27/2016 1032   MCV 83.7 02/27/2016 1032   MCHC 34.1 02/27/2016 1032   RDW 13.2 02/27/2016 1032   LYMPHSABS 1.9 02/27/2016 1032   MONOABS 0.6 02/27/2016 1032   EOSABS 0.1 02/27/2016 1032   BASOSABS 0.0 02/27/2016 1032    Hgb A1C No results found for: HGBA1C         Assessment & Plan:   Neck Pain:  Likely muscular in nature Advised him to try Ibuprofen and heat Neck exercises given He can try the Flexeril he has to see if it helps Massage may also be helpful If worsens, consider xray of cervical spine Work note provided  Return precautions discussed Webb Silversmith, NP

## 2017-05-30 NOTE — Patient Instructions (Signed)
Neck Exercises Neck exercises can be important for many reasons:  They can help you to improve and maintain flexibility in your neck. This can be especially important as you age.  They can help to make your neck stronger. This can make movement easier.  They can reduce or prevent neck pain.  They may help your upper back.  Ask your health care provider which neck exercises would be best for you. Exercises Neck Press Repeat this exercise 10 times. Do it first thing in the morning and right before bed or as told by your health care provider. 1. Lie on your back on a firm bed or on the floor with a pillow under your head. 2. Use your neck muscles to push your head down on the pillow and straighten your spine. 3. Hold the position as well as you can. Keep your head facing up and your chin tucked. 4. Slowly count to 5 while holding this position. 5. Relax for a few seconds. Then repeat.  Isometric Strengthening Do a full set of these exercises 2 times a day or as told by your health care provider. 1. Sit in a supportive chair and place your hand on your forehead. 2. Push forward with your head and neck while pushing back with your hand. Hold for 10 seconds. 3. Relax. Then repeat the exercise 3 times. 4. Next, do thesequence again, this time putting your hand against the back of your head. Use your head and neck to push backward against the hand pressure. 5. Finally, do the same exercise on either side of your head, pushing sideways against the pressure of your hand.  Prone Head Lifts Repeat this exercise 5 times. Do this 2 times a day or as told by your health care provider. 1. Lie face-down, resting on your elbows so that your chest and upper back are raised. 2. Start with your head facing downward, near your chest. Position your chin either on or near your chest. 3. Slowly lift your head upward. Lift until you are looking straight ahead. Then continue lifting your head as far back as  you can stretch. 4. Hold your head up for 5 seconds. Then slowly lower it to your starting position.  Supine Head Lifts Repeat this exercise 8-10 times. Do this 2 times a day or as told by your health care provider. 1. Lie on your back, bending your knees to point to the ceiling and keeping your feet flat on the floor. 2. Lift your head slowly off the floor, raising your chin toward your chest. 3. Hold for 5 seconds. 4. Relax and repeat.  Scapular Retraction Repeat this exercise 5 times. Do this 2 times a day or as told by your health care provider. 1. Stand with your arms at your sides. Look straight ahead. 2. Slowly pull both shoulders backward and downward until you feel a stretch between your shoulder blades in your upper back. 3. Hold for 10-30 seconds. 4. Relax and repeat.  Contact a health care provider if:  Your neck pain or discomfort gets much worse when you do an exercise.  Your neck pain or discomfort does not improve within 2 hours after you exercise. If you have any of these problems, stop exercising right away. Do not do the exercises again unless your health care provider says that you can. Get help right away if:  You develop sudden, severe neck pain. If this happens, stop exercising right away. Do not do the exercises again unless your   health care provider says that you can. Exercises Neck Stretch  Repeat this exercise 3-5 times. 1. Do this exercise while standing or while sitting in a chair. 2. Place your feet flat on the floor, shoulder-width apart. 3. Slowly turn your head to the right. Turn it all the way to the right so you can look over your right shoulder. Do not tilt or tip your head. 4. Hold this position for 10-30 seconds. 5. Slowly turn your head to the left, to look over your left shoulder. 6. Hold this position for 10-30 seconds.  Neck Retraction Repeat this exercise 8-10 times. Do this 3-4 times a day or as told by your health care  provider. 1. Do this exercise while standing or while sitting in a sturdy chair. 2. Look straight ahead. Do not bend your neck. 3. Use your fingers to push your chin backward. Do not bend your neck for this movement. Continue to face straight ahead. If you are doing the exercise properly, you will feel a slight sensation in your throat and a stretch at the back of your neck. 4. Hold the stretch for 1-2 seconds. Relax and repeat.  This information is not intended to replace advice given to you by your health care provider. Make sure you discuss any questions you have with your health care provider. Document Released: 10/10/2015 Document Revised: 04/05/2016 Document Reviewed: 05/09/2015 Elsevier Interactive Patient Education  2017 [employer]  

## 2017-08-31 ENCOUNTER — Other Ambulatory Visit: Payer: Self-pay | Admitting: Family Medicine

## 2017-09-12 DIAGNOSIS — D2261 Melanocytic nevi of right upper limb, including shoulder: Secondary | ICD-10-CM | POA: Diagnosis not present

## 2017-10-29 DIAGNOSIS — K080 Exfoliation of teeth due to systemic causes: Secondary | ICD-10-CM | POA: Diagnosis not present

## 2017-11-28 ENCOUNTER — Telehealth: Payer: Self-pay | Admitting: Family Medicine

## 2017-11-28 MED ORDER — OMEPRAZOLE 40 MG PO CPDR: DELAYED_RELEASE_CAPSULE | ORAL | 1 refills | Status: DC

## 2017-11-28 NOTE — Telephone Encounter (Signed)
Copied from [place] [phone] [phone]. Topic: Inquiry >> Nov 28, 2017  1:08 PM Pricilla Handler wrote: Reason for CRM: Apolonio Schneiders from [facility] called requesting a refill of Omeprazole (PRILOSEC) 40 MG capsule for the patient. Apolonio Schneiders stated that the pharmacy has sent over several fax requests, but the faxes would not go through. Patient's preferred pharmacy is [id number] - [address], [place] [phone] (Phone)   [phone] (Fax).

## 2017-11-28 NOTE — Telephone Encounter (Signed)
LR: 09/02/17 #90 ) RF OV: 03/21/17  Refilled x 4 months

## 2017-12-02 ENCOUNTER — Ambulatory Visit: Payer: Federal, State, Local not specified - PPO | Admitting: Internal Medicine

## 2017-12-04 ENCOUNTER — Ambulatory Visit: Payer: Federal, State, Local not specified - PPO | Admitting: Internal Medicine

## 2017-12-04 ENCOUNTER — Encounter: Payer: Self-pay | Admitting: Internal Medicine

## 2017-12-04 DIAGNOSIS — G2581 Restless legs syndrome: Secondary | ICD-10-CM | POA: Diagnosis not present

## 2017-12-04 DIAGNOSIS — J45909 Unspecified asthma, uncomplicated: Secondary | ICD-10-CM

## 2017-12-04 DIAGNOSIS — I10 Essential (primary) hypertension: Secondary | ICD-10-CM

## 2017-12-04 DIAGNOSIS — F329 Major depressive disorder, single episode, unspecified: Secondary | ICD-10-CM | POA: Diagnosis not present

## 2017-12-04 DIAGNOSIS — F419 Anxiety disorder, unspecified: Secondary | ICD-10-CM | POA: Diagnosis not present

## 2017-12-04 DIAGNOSIS — K219 Gastro-esophageal reflux disease without esophagitis: Secondary | ICD-10-CM | POA: Diagnosis not present

## 2017-12-04 DIAGNOSIS — F32A Depression, unspecified: Secondary | ICD-10-CM

## 2017-12-04 MED ORDER — ROPINIROLE HCL 0.25 MG PO TABS: 0.2500 mg | ORAL_TABLET | Freq: Every day | ORAL | 2 refills | Status: DC

## 2017-12-04 MED ORDER — ALBUTEROL SULFATE HFA 108 (90 BASE) MCG/ACT IN AERS: 2.0000 | INHALATION_SPRAY | Freq: Four times a day (QID) | RESPIRATORY_TRACT | 0 refills | Status: DC | PRN

## 2017-12-04 NOTE — Assessment & Plan Note (Signed)
Controlled with Omeprazole Discussed how weight loss could help improve his reflux

## 2017-12-04 NOTE — Assessment & Plan Note (Signed)
Chronic but stable on Celexa He declines trying to wean medication at this time Will monnitor

## 2017-12-04 NOTE — Patient Instructions (Addendum)

## 2017-12-04 NOTE — Assessment & Plan Note (Signed)
Controlled on Losartan HCT Continue for now Reinforced DASH diet and exercise for weight loss

## 2017-12-04 NOTE — Assessment & Plan Note (Signed)
D/C Flexeril eRx for Requip 0.25 mg QHS

## 2017-12-04 NOTE — Progress Notes (Signed)
HPI  Pt presents to the clinic today to establish care and for management of the conditions listed below. He is transferring care from Dr. Deborra Medina.  Anxiety and Depression: Previous alcohol abuse. He is taking Celexa and feels like it is working well for him. He denies SI/HI.  HTN: His BP today is 122/84. He is taking Losartan HCT as prescribed. ECG from 05/2012 reviewed.  GERD: Triggered by tomato based foods. He denies breakthrough on Omeprazole.  Asthma: Worse in the spring and fall. He will take an antihistamine OTC and Albuterol as needed. He would like a refill of Albuterol today.   RLS: He has muscle spasms in his legs at night. He takes Flexeril as needed with some relief, but he reports it makes him super drowsy.  Flu: never Tetanus: 02/2016 Vision Screening: annually Dentist: biannually  Past Medical History:  Diagnosis Date  . Anxiety   . Hypertension     Current Outpatient Medications  Medication Sig Dispense Refill  . albuterol (PROVENTIL HFA;VENTOLIN HFA) 108 (90 BASE) MCG/ACT inhaler Inhale 2 puffs into the lungs every 6 (six) hours as needed. 1 Inhaler 0  . citalopram (CELEXA) 40 MG tablet TAKE 1 TABLET (40 MG TOTAL) BY MOUTH DAILY. 90 tablet 3  . cyclobenzaprine (FLEXERIL) 10 MG tablet TAKE 1 TABLET (10 MG TOTAL) BY MOUTH AT BEDTIME AS NEEDED FOR MUSCLE SPASMS. (Patient not taking: Reported on 05/30/2017) 30 tablet 1  . losartan-hydrochlorothiazide (HYZAAR) 50-12.5 MG tablet Take 1 tablet by mouth daily. 90 tablet 3  . Multiple Vitamin (MULTIVITAMIN) tablet Take 1 tablet by mouth daily.    Marland Kitchen omeprazole (PRILOSEC) 40 MG capsule TAKE 1 CAPSULE BY MOUTH EVERY DAY 90 capsule 1   No current facility-administered medications for this visit.     Allergies  Allergen Reactions  . Ace Inhibitors     Cough     Family History  Problem Relation Age of Onset  . Cancer Mother        breast  . Cancer Father        prostate    Social History   Socioeconomic History  .  Marital status: Married    Spouse name: Not on file  . Number of children: 0  . Years of education: Not on file  . Highest education level: Not on file  Social Needs  . Financial resource strain: Not on file  . Food insecurity - worry: Not on file  . Food insecurity - inability: Not on file  . Transportation needs - medical: Not on file  . Transportation needs - non-medical: Not on file  Occupational History  . Not on file  Tobacco Use  . Smoking status: Former Smoker    Last attempt to quit: 11/12/1993    Years since quitting: 24.0  . Smokeless tobacco: Current User    Types: Chew  Substance and Sexual Activity  . Alcohol use: No    Alcohol/week: 0.0 oz  . Drug use: Not on file  . Sexual activity: Not on file  Other Topics Concern  . Not on file  Social History Narrative  . Not on file    ROS:  Constitutional: Denies fever, malaise, fatigue, headache or abrupt weight changes.  HEENT: Denies eye pain, eye redness, ear pain, ringing in the ears, wax buildup, runny nose, nasal congestion, bloody nose, or sore throat. Respiratory: Denies difficulty breathing, shortness of breath, cough or sputum production.   Cardiovascular: Denies chest pain, chest tightness, palpitations or swelling in the  hands or feet.  Gastrointestinal: Denies abdominal pain, bloating, constipation, diarrhea or blood in the stool.  GU: Denies frequency, urgency, pain with urination, blood in urine, odor or discharge. Musculoskeletal: Denies decrease in range of motion, difficulty with gait, muscle pain or joint pain and swelling.  Skin: Denies redness, rashes, lesions or ulcercations.  Neurological: Pt reports restless leg syndrome. Denies dizziness, difficulty with memory, difficulty with speech or problems with balance and coordination.  Psych: Pt reports a history of anxiety and depression. Denies SI/HI.  No other specific complaints in a complete review of systems (except as listed in HPI  above).  PE:  BP 122/84   Pulse 82   Temp 98.5 F (36.9 C) (Oral)   Wt 229 lb (103.9 kg)   SpO2 98%   BMI 35.60 kg/m   Wt Readings from Last 3 Encounters:  05/30/17 230 lb (104.3 kg)  03/21/17 227 lb (103 kg)  12/24/16 226 lb (102.5 kg)    General: Appears his stated age, obese in NAD. HEENT: Head: normal shape and size; Eyes: sclera white, no icterus, conjunctiva pink, PERRLA and EOMs intact; Ears: Tm's gray and intact, normal light reflex;Throat/Mouth: Teeth present, mucosa pink and moist, no lesions or ulcerations noted.  Neck: Neck supple, trachea midline. No masses, lumps or thyromegaly present.  Cardiovascular: Normal rate and rhythm. S1,S2 noted.  No murmur, rubs or gallops noted.  Pulmonary/Chest: Normal effort and positive vesicular breath sounds. No respiratory distress. No wheezes, rales or ronchi noted.  Abdomen: Soft and nontender. Normal bowel sounds. Musculoskeletal: No difficulty with gait.  Neurological: Alert and oriented.  Psychiatric: Mood and affect normal. Behavior is normal. Judgment and thought content normal.    BMET    Component Value Date/Time   NA 141 03/21/2017 1044   K 4.3 03/21/2017 1044   CL 103 03/21/2017 1044   CO2 31 03/21/2017 1044   GLUCOSE 103 (H) 03/21/2017 1044   BUN 10 03/21/2017 1044   CREATININE 0.99 03/21/2017 1044   CALCIUM 9.7 03/21/2017 1044   GFRNONAA 95.90 09/13/2010 1103    Lipid Panel     Component Value Date/Time   CHOL 174 03/21/2017 1044   TRIG 89.0 03/21/2017 1044   HDL 36.20 (L) 03/21/2017 1044   CHOLHDL 5 03/21/2017 1044   VLDL 17.8 03/21/2017 1044   LDLCALC 120 (H) 03/21/2017 1044    CBC    Component Value Date/Time   WBC 7.5 02/27/2016 1032   RBC 5.54 02/27/2016 1032   HGB 15.8 02/27/2016 1032   HCT 46.3 02/27/2016 1032   PLT 194.0 02/27/2016 1032   MCV 83.7 02/27/2016 1032   MCHC 34.1 02/27/2016 1032   RDW 13.2 02/27/2016 1032   LYMPHSABS 1.9 02/27/2016 1032   MONOABS 0.6 02/27/2016 1032    EOSABS 0.1 02/27/2016 1032   BASOSABS 0.0 02/27/2016 1032    Hgb A1C No results found for: HGBA1C   Assessment and Plan:

## 2017-12-04 NOTE — Assessment & Plan Note (Signed)
Continue Albuterol prn, refilled today

## 2018-01-14 ENCOUNTER — Ambulatory Visit (INDEPENDENT_AMBULATORY_CARE_PROVIDER_SITE_OTHER): Payer: Federal, State, Local not specified - PPO | Admitting: Internal Medicine

## 2018-01-14 ENCOUNTER — Encounter: Payer: Self-pay | Admitting: Internal Medicine

## 2018-01-14 VITALS — BP 122/84 | HR 78 | Temp 98.4°F | Ht 68.5 in | Wt 227.0 lb

## 2018-01-14 DIAGNOSIS — Z0000 Encounter for general adult medical examination without abnormal findings: Secondary | ICD-10-CM | POA: Diagnosis not present

## 2018-01-14 NOTE — Patient Instructions (Signed)

## 2018-01-14 NOTE — Progress Notes (Signed)
Subjective:    Patient ID: Jonathan [name], male    DOB: [birth year], 47 y.o.   MRN: [id number]  HPI  Pt presents to the clinic today for his annual exam.  Flu: never Tetanus: 02/2016 Vision Screening: annually Dentist: biannually  Diet: He does eat meat. He consumes some veggies, no fruits. He does eat fried foods. He drinks water, 1 Mt Dew daily. Exercise: None  Review of Systems      Past Medical History:  Diagnosis Date  . Anxiety   . Hypertension     Current Outpatient Medications  Medication Sig Dispense Refill  . albuterol (PROVENTIL HFA;VENTOLIN HFA) 108 (90 Base) MCG/ACT inhaler Inhale 2 puffs into the lungs every 6 (six) hours as needed. 1 Inhaler 0  . citalopram (CELEXA) 40 MG tablet TAKE 1 TABLET (40 MG TOTAL) BY MOUTH DAILY. 90 tablet 3  . losartan-hydrochlorothiazide (HYZAAR) 50-12.5 MG tablet Take 1 tablet by mouth daily. 90 tablet 3  . Multiple Vitamin (MULTIVITAMIN) tablet Take 1 tablet by mouth daily.    Marland Kitchen omeprazole (PRILOSEC) 40 MG capsule TAKE 1 CAPSULE BY MOUTH EVERY DAY 90 capsule 1  . rOPINIRole (REQUIP) 0.25 MG tablet Take 1 tablet (0.25 mg total) by mouth at bedtime. 30 tablet 2   No current facility-administered medications for this visit.     Allergies  Allergen Reactions  . Ace Inhibitors     Cough     Family History  Problem Relation Age of Onset  . Cancer Mother        breast  . Cancer Father        prostate    Social History   Socioeconomic History  . Marital status: Married    Spouse name: Not on file  . Number of children: 0  . Years of education: Not on file  . Highest education level: Not on file  Social Needs  . Financial resource strain: Not on file  . Food insecurity - worry: Not on file  . Food insecurity - inability: Not on file  . Transportation needs - medical: Not on file  . Transportation needs - non-medical: Not on file  Occupational History  . Not on file  Tobacco Use  . Smoking status: Former  Smoker    Last attempt to quit: 11/12/1993    Years since quitting: 24.1  . Smokeless tobacco: Current User    Types: Chew  Substance and Sexual Activity  . Alcohol use: No    Alcohol/week: 0.0 oz  . Drug use: Not on file  . Sexual activity: Not on file  Other Topics Concern  . Not on file  Social History Narrative  . Not on file     Constitutional: Denies fever, malaise, fatigue, headache or abrupt weight changes.  HEENT: Denies eye pain, eye redness, ear pain, ringing in the ears, wax buildup, runny nose, nasal congestion, bloody nose, or sore throat. Respiratory: Denies difficulty breathing, shortness of breath, cough or sputum production.   Cardiovascular: Denies chest pain, chest tightness, palpitations or swelling in the hands or feet.  Gastrointestinal: Denies abdominal pain, bloating, constipation, diarrhea or blood in the stool.  GU: Denies urgency, frequency, pain with urination, burning sensation, blood in urine, odor or discharge. Musculoskeletal: Denies decrease in range of motion, difficulty with gait, muscle pain or joint pain and swelling.  Skin: Denies redness, rashes, lesions or ulcercations.  Neurological: Pt reports restless legs. Denies dizziness, difficulty with memory, difficulty with speech or problems with balance and  coordination.  Psych: Denies anxiety, depression, SI/HI.  No other specific complaints in a complete review of systems (except as listed in HPI above).  Objective:   Physical Exam   BP 122/84   Pulse 78   Temp 98.4 F (36.9 C) (Oral)   Ht 5' 8.5" (1.74 m)   Wt 227 lb (103 kg)   SpO2 98%   BMI 34.01 kg/m  Wt Readings from Last 3 Encounters:  01/14/18 227 lb (103 kg)  12/04/17 229 lb (103.9 kg)  05/30/17 230 lb (104.3 kg)    General: Appears his stated age, obese in NAD. Skin: Warm, dry and intact.  HEENT: Head: normal shape and size; Eyes: sclera white, no icterus, conjunctiva pink, PERRLA and EOMs intact; Ears: Tm's gray and  intact, normal light reflex; Throat/Mouth: Teeth present, mucosa pink and moist, no exudate, lesions or ulcerations noted.  Neck:  Neck supple, trachea midline. No masses, lumps or thyromegaly present.  Cardiovascular: Normal rate and rhythm. S1,S2 noted.  No murmur, rubs or gallops noted. No JVD or BLE edema.  Pulmonary/Chest: Normal effort and positive vesicular breath sounds. No respiratory distress. No wheezes, rales or ronchi noted.  Abdomen: Soft and nontender. Normal bowel sounds. No distention or masses noted. Liver, spleen and kidneys non palpable. Musculoskeletal: Strength 5/5 BUE/BLE. No difficulty with gait.  Neurological: Alert and oriented. Cranial nerves II-XII grossly intact. Coordination normal.  Psychiatric: Mood and affect normal. Behavior is normal. Judgment and thought content normal.    BMET    Component Value Date/Time   NA 141 03/21/2017 1044   K 4.3 03/21/2017 1044   CL 103 03/21/2017 1044   CO2 31 03/21/2017 1044   GLUCOSE 103 (H) 03/21/2017 1044   BUN 10 03/21/2017 1044   CREATININE 0.99 03/21/2017 1044   CALCIUM 9.7 03/21/2017 1044   GFRNONAA 95.90 09/13/2010 1103    Lipid Panel     Component Value Date/Time   CHOL 174 03/21/2017 1044   TRIG 89.0 03/21/2017 1044   HDL 36.20 (L) 03/21/2017 1044   CHOLHDL 5 03/21/2017 1044   VLDL 17.8 03/21/2017 1044   LDLCALC 120 (H) 03/21/2017 1044    CBC    Component Value Date/Time   WBC 7.5 02/27/2016 1032   RBC 5.54 02/27/2016 1032   HGB 15.8 02/27/2016 1032   HCT 46.3 02/27/2016 1032   PLT 194.0 02/27/2016 1032   MCV 83.7 02/27/2016 1032   MCHC 34.1 02/27/2016 1032   RDW 13.2 02/27/2016 1032   LYMPHSABS 1.9 02/27/2016 1032   MONOABS 0.6 02/27/2016 1032   EOSABS 0.1 02/27/2016 1032   BASOSABS 0.0 02/27/2016 1032    Hgb A1C No results found for: HGBA1C         Assessment & Plan:   Preventative Health Maintenance:  He declines flu shot today Tetanus UTD Encouraged him to consume a  balanced diet and exercise regimen Advised him to see an eye doctor and dentist annually Will check CBC, CMET, Lipid profile today  RTC in 1 year, sooner if needed Webb Silversmith, NP

## 2018-01-15 LAB — COMPREHENSIVE METABOLIC PANEL
ALT: 19 U/L (ref 0–53)
AST: 15 U/L (ref 0–37)
Albumin: 4.5 g/dL (ref 3.5–5.2)
Alkaline Phosphatase: 59 U/L (ref 39–117)
BUN: 14 mg/dL (ref 6–23)
CHLORIDE: 101 meq/L (ref 96–112)
CO2: 30 mEq/L (ref 19–32)
Calcium: 9.9 mg/dL (ref 8.4–10.5)
Creatinine, Ser: 0.96 mg/dL (ref 0.40–1.50)
GFR: 89.3 mL/min (ref >60.00)
Glucose, Bld: 78 mg/dL (ref 70–99)
POTASSIUM: 3.9 meq/L (ref 3.5–5.1)
SODIUM: 139 meq/L (ref 135–145)
Total Bilirubin: 0.9 mg/dL (ref 0.2–1.2)
Total Protein: 7.4 g/dL (ref 6.0–8.3)

## 2018-01-15 LAB — CBC
HEMATOCRIT: 48.5 % (ref 39.0–52.0)
Hemoglobin: 16.6 g/dL (ref 13.0–17.0)
MCHC: 34.3 g/dL (ref 30.0–36.0)
MCV: 83.5 fl (ref 78.0–100.0)
Platelets: 195 10*3/uL (ref 150.0–400.0)
RBC: 5.8 Mil/uL (ref 4.22–5.81)
RDW: 13 % (ref 11.5–15.5)
WBC: 9.4 10*3/uL (ref 4.0–10.5)

## 2018-01-15 LAB — LIPID PANEL
CHOLESTEROL: 167 mg/dL (ref 0–200)
HDL: 32.9 mg/dL — AB (ref >39.00)
LDL CALC: 108 mg/dL — AB (ref 0–99)
NonHDL: 133.92
TRIGLYCERIDES: 130 mg/dL (ref 0.0–149.0)
Total CHOL/HDL Ratio: 5
VLDL: 26 mg/dL (ref 0.0–40.0)

## 2018-02-18 ENCOUNTER — Other Ambulatory Visit: Payer: Self-pay | Admitting: Family Medicine

## 2018-02-26 ENCOUNTER — Other Ambulatory Visit: Payer: Self-pay | Admitting: Internal Medicine

## 2018-02-26 NOTE — Telephone Encounter (Signed)
Last filled 12/04/17... Please advise

## 2018-04-16 DIAGNOSIS — K080 Exfoliation of teeth due to systemic causes: Secondary | ICD-10-CM | POA: Diagnosis not present

## 2018-04-23 DIAGNOSIS — K080 Exfoliation of teeth due to systemic causes: Secondary | ICD-10-CM | POA: Diagnosis not present

## 2018-05-06 DIAGNOSIS — K080 Exfoliation of teeth due to systemic causes: Secondary | ICD-10-CM | POA: Diagnosis not present

## 2018-05-13 ENCOUNTER — Other Ambulatory Visit: Payer: Self-pay | Admitting: Internal Medicine

## 2018-05-14 NOTE — Telephone Encounter (Signed)
Ok to refill? Last prescribed on 02/26/2018. Last seen on 01/14/2018.

## 2018-05-24 ENCOUNTER — Other Ambulatory Visit: Payer: Self-pay | Admitting: Family Medicine

## 2018-05-24 ENCOUNTER — Other Ambulatory Visit: Payer: Self-pay | Admitting: Internal Medicine

## 2018-05-26 NOTE — Telephone Encounter (Signed)
Please see refill request. Thanks TLG

## 2018-06-03 DIAGNOSIS — K080 Exfoliation of teeth due to systemic causes: Secondary | ICD-10-CM | POA: Diagnosis not present

## 2018-07-14 ENCOUNTER — Other Ambulatory Visit: Payer: Self-pay | Admitting: Internal Medicine

## 2018-08-17 ENCOUNTER — Other Ambulatory Visit: Payer: Self-pay | Admitting: Internal Medicine

## 2018-09-11 DIAGNOSIS — D2261 Melanocytic nevi of right upper limb, including shoulder: Secondary | ICD-10-CM | POA: Diagnosis not present

## 2018-09-11 DIAGNOSIS — D225 Melanocytic nevi of trunk: Secondary | ICD-10-CM | POA: Diagnosis not present

## 2018-09-11 DIAGNOSIS — D2262 Melanocytic nevi of left upper limb, including shoulder: Secondary | ICD-10-CM | POA: Diagnosis not present

## 2018-09-11 DIAGNOSIS — L720 Epidermal cyst: Secondary | ICD-10-CM | POA: Diagnosis not present

## 2018-10-22 DIAGNOSIS — K080 Exfoliation of teeth due to systemic causes: Secondary | ICD-10-CM | POA: Diagnosis not present

## 2018-11-08 ENCOUNTER — Other Ambulatory Visit: Payer: Self-pay | Admitting: Internal Medicine

## 2018-11-13 ENCOUNTER — Other Ambulatory Visit: Payer: Self-pay | Admitting: Internal Medicine

## 2018-11-20 ENCOUNTER — Other Ambulatory Visit: Payer: Self-pay | Admitting: Internal Medicine

## 2018-11-25 ENCOUNTER — Ambulatory Visit: Payer: Federal, State, Local not specified - PPO | Admitting: Internal Medicine

## 2018-11-25 ENCOUNTER — Encounter: Payer: Self-pay | Admitting: Internal Medicine

## 2018-11-25 VITALS — BP 124/82 | HR 76 | Temp 98.3°F | Wt 228.0 lb

## 2018-11-25 DIAGNOSIS — G2581 Restless legs syndrome: Secondary | ICD-10-CM | POA: Diagnosis not present

## 2018-11-25 DIAGNOSIS — T753XXA Motion sickness, initial encounter: Secondary | ICD-10-CM | POA: Diagnosis not present

## 2018-11-25 MED ORDER — SCOPOLAMINE 1 MG/3DAYS TD PT72: 1.0000 | MEDICATED_PATCH | TRANSDERMAL | 0 refills | Status: DC

## 2018-11-25 MED ORDER — ROPINIROLE HCL 0.25 MG PO TABS: 0.2500 mg | ORAL_TABLET | Freq: Two times a day (BID) | ORAL | 1 refills | Status: DC

## 2018-11-26 ENCOUNTER — Encounter: Payer: Self-pay | Admitting: Internal Medicine

## 2018-11-26 NOTE — Patient Instructions (Signed)

## 2018-11-26 NOTE — Assessment & Plan Note (Signed)
Deteriorated We will have him take ropinirole 1 tab at 3 PM and 1 tab before bedtime Encouraged regular stretching to help reduce symptoms

## 2018-11-26 NOTE — Progress Notes (Signed)
Subjective:    Patient ID: Jonathan [name], male    DOB: 16-Aug-1971, 48 y.o.   MRN: [id number]  HPI  Patient presents to the clinic today with complaint of worsening restless leg symptoms.  He reports he starting to notice symptoms during the day in addition to at night.  He reports when he comes home from work and sits down, his legs feel like they are crawling and want to jump.  He is currently taking Ropinirole 0.25 mg before bed.  He is unsure if the dose is effective and is wondering if it should be increased.  He reports he is also going on a cruise and would like a Rx for scopolamine patches for motion sickness.  Review of Systems      Past Medical History:  Diagnosis Date  . Anxiety   . Hypertension     Current Outpatient Medications  Medication Sig Dispense Refill  . albuterol (PROVENTIL HFA;VENTOLIN HFA) 108 (90 Base) MCG/ACT inhaler Inhale 2 puffs into the lungs every 6 (six) hours as needed. 1 Inhaler 0  . citalopram (CELEXA) 40 MG tablet TAKE 1 TABLET BY MOUTH EVERY DAY 90 tablet 0  . hydrochlorothiazide (MICROZIDE) 12.5 MG capsule Take 1 capsule (12.5 mg total) by mouth daily. MUST SCHEDULE ANNUAL PHYSICAL 90 capsule 0  . losartan (COZAAR) 100 MG tablet Take 0.5 tablets (50 mg total) by mouth daily. MUST SCHEDULE ANNUAL PHYSICAL EXAM 45 tablet 0  . Multiple Vitamin (MULTIVITAMIN) tablet Take 1 tablet by mouth daily.    Marland Kitchen omeprazole (PRILOSEC) 40 MG capsule TAKE 1 CAPSULE BY MOUTH EVERY DAY 90 capsule 0  . rOPINIRole (REQUIP) 0.25 MG tablet Take 1 tablet (0.25 mg total) by mouth 2 (two) times daily. 180 tablet 1  . scopolamine (TRANSDERM-SCOP, 1.5 MG,) 1 MG/3DAYS Place 1 patch (1.5 mg total) onto the skin every 3 (three) days. 4 patch 0   No current facility-administered medications for this visit.     Allergies  Allergen Reactions  . Ace Inhibitors     Cough     Family History  Problem Relation Age of Onset  . Cancer Mother        breast  . Cancer  Father        prostate    Social History   Socioeconomic History  . Marital status: Married    Spouse name: Not on file  . Number of children: 0  . Years of education: Not on file  . Highest education level: Not on file  Occupational History  . Not on file  Social Needs  . Financial resource strain: Not on file  . Food insecurity:    Worry: Not on file    Inability: Not on file  . Transportation needs:    Medical: Not on file    Non-medical: Not on file  Tobacco Use  . Smoking status: Former Smoker    Last attempt to quit: 11/12/1993    Years since quitting: 25.0  . Smokeless tobacco: Current User    Types: Chew  Substance and Sexual Activity  . Alcohol use: No    Alcohol/week: 0.0 standard drinks  . Drug use: Not on file  . Sexual activity: Not on file  Lifestyle  . Physical activity:    Days per week: Not on file    Minutes per session: Not on file  . Stress: Not on file  Relationships  . Social connections:    Talks on phone: Not on file  Gets together: Not on file    Attends religious service: Not on file    Active member of club or organization: Not on file    Attends meetings of clubs or organizations: Not on file    Relationship status: Not on file  . Intimate partner violence:    Fear of current or ex partner: Not on file    Emotionally abused: Not on file    Physically abused: Not on file    Forced sexual activity: Not on file  Other Topics Concern  . Not on file  Social History Narrative  . Not on file     Constitutional: Denies fever, malaise, fatigue, headache or abrupt weight changes.  Musculoskeletal: Patient reports restless legs. Denies decrease in range of motion, difficulty with gait, muscle pain or joint pain and swelling.  Neurological: Denies dizziness, difficulty with memory, difficulty with speech or problems with balance and coordination.    No other specific complaints in a complete review of systems (except as listed in HPI  above).  Objective:   Physical Exam   BP 124/82   Pulse 76   Temp 98.3 F (36.8 C) (Oral)   Wt 228 lb (103.4 kg)   SpO2 98%   BMI 34.16 kg/m  Wt Readings from Last 3 Encounters:  11/25/18 228 lb (103.4 kg)  01/14/18 227 lb (103 kg)  12/04/17 229 lb (103.9 kg)    General: Appears his stated age, obese, in NAD. Cardiovascular: Normal rate and rhythm. S1,S2 noted.  No murmur, rubs or gallops noted. No JVD or BLE edema. Pulmonary/Chest: Normal effort and positive vesicular breath sounds. No respiratory distress. No wheezes, rales or ronchi noted.  Musculoskeletal: No pain with palpation of the BLE.  Strength 5/5 BLE.Marland Kitchen No difficulty with gait.  Neurological: Alert and oriented.  Sensation intact to BLE.   BMET    Component Value Date/Time   NA 139 01/14/2018 1549   K 3.9 01/14/2018 1549   CL 101 01/14/2018 1549   CO2 30 01/14/2018 1549   GLUCOSE 78 01/14/2018 1549   BUN 14 01/14/2018 1549   CREATININE 0.96 01/14/2018 1549   CALCIUM 9.9 01/14/2018 1549   GFRNONAA 95.90 09/13/2010 1103    Lipid Panel     Component Value Date/Time   CHOL 167 01/14/2018 1549   TRIG 130.0 01/14/2018 1549   HDL 32.90 (L) 01/14/2018 1549   CHOLHDL 5 01/14/2018 1549   VLDL 26.0 01/14/2018 1549   LDLCALC 108 (H) 01/14/2018 1549    CBC    Component Value Date/Time   WBC 9.4 01/14/2018 1549   RBC 5.80 01/14/2018 1549   HGB 16.6 01/14/2018 1549   HCT 48.5 01/14/2018 1549   PLT 195.0 01/14/2018 1549   MCV 83.5 01/14/2018 1549   MCHC 34.3 01/14/2018 1549   RDW 13.0 01/14/2018 1549   LYMPHSABS 1.9 02/27/2016 1032   MONOABS 0.6 02/27/2016 1032   EOSABS 0.1 02/27/2016 1032   BASOSABS 0.0 02/27/2016 1032    Hgb A1C No results found for: HGBA1C         Assessment & Plan:   Motion Sickness:  Rx for scopolamine patches prescribed per request  Make an appointment for your annual exam in 2 months Webb Silversmith, NP

## 2018-12-11 ENCOUNTER — Other Ambulatory Visit: Payer: Self-pay | Admitting: Internal Medicine

## 2018-12-14 ENCOUNTER — Other Ambulatory Visit: Payer: Self-pay | Admitting: Internal Medicine

## 2018-12-15 NOTE — Telephone Encounter (Signed)
Medication was changed from 1 tab at bedtime to 1 tab at 3pm and 1 tab QHS, Rx was "no print" from 11/25/18 OV.Marland KitchenMarland KitchenMarland Kitchen

## 2018-12-24 DIAGNOSIS — K080 Exfoliation of teeth due to systemic causes: Secondary | ICD-10-CM | POA: Diagnosis not present

## 2019-01-16 DIAGNOSIS — B9789 Other viral agents as the cause of diseases classified elsewhere: Secondary | ICD-10-CM | POA: Diagnosis not present

## 2019-01-16 DIAGNOSIS — R0982 Postnasal drip: Secondary | ICD-10-CM | POA: Diagnosis not present

## 2019-01-16 DIAGNOSIS — J069 Acute upper respiratory infection, unspecified: Secondary | ICD-10-CM | POA: Diagnosis not present

## 2019-01-25 ENCOUNTER — Other Ambulatory Visit: Payer: Self-pay | Admitting: Internal Medicine

## 2019-02-02 ENCOUNTER — Other Ambulatory Visit: Payer: Self-pay | Admitting: Internal Medicine

## 2019-02-24 ENCOUNTER — Other Ambulatory Visit: Payer: Self-pay | Admitting: Internal Medicine

## 2019-02-25 NOTE — Telephone Encounter (Signed)
Last filled 12/15/2018, pt had a f/u visit 11/2018... please advise

## 2019-04-20 ENCOUNTER — Other Ambulatory Visit: Payer: Self-pay | Admitting: Internal Medicine

## 2019-04-21 ENCOUNTER — Other Ambulatory Visit: Payer: Self-pay | Admitting: Internal Medicine

## 2019-05-07 ENCOUNTER — Other Ambulatory Visit: Payer: Self-pay | Admitting: Internal Medicine

## 2019-05-07 NOTE — Telephone Encounter (Signed)
Last filled 02/25/2019, last OV 11/2018.Marland KitchenMarland KitchenMarland Kitchen please advise

## 2019-05-21 DIAGNOSIS — G2581 Restless legs syndrome: Secondary | ICD-10-CM | POA: Diagnosis not present

## 2019-05-21 DIAGNOSIS — Z03818 Encounter for observation for suspected exposure to other biological agents ruled out: Secondary | ICD-10-CM | POA: Diagnosis not present

## 2019-05-21 DIAGNOSIS — I10 Essential (primary) hypertension: Secondary | ICD-10-CM | POA: Diagnosis not present

## 2019-05-21 DIAGNOSIS — F419 Anxiety disorder, unspecified: Secondary | ICD-10-CM | POA: Diagnosis not present

## 2019-05-21 DIAGNOSIS — K219 Gastro-esophageal reflux disease without esophagitis: Secondary | ICD-10-CM | POA: Diagnosis not present

## 2019-05-21 DIAGNOSIS — M1990 Unspecified osteoarthritis, unspecified site: Secondary | ICD-10-CM | POA: Diagnosis not present

## 2019-05-21 DIAGNOSIS — M545 Low back pain: Secondary | ICD-10-CM | POA: Diagnosis not present

## 2019-06-18 ENCOUNTER — Other Ambulatory Visit: Payer: Self-pay

## 2019-06-18 ENCOUNTER — Telehealth: Payer: Self-pay

## 2019-06-18 ENCOUNTER — Encounter: Payer: Self-pay | Admitting: Internal Medicine

## 2019-06-18 ENCOUNTER — Ambulatory Visit: Payer: Federal, State, Local not specified - PPO | Admitting: Internal Medicine

## 2019-06-18 VITALS — BP 122/82 | HR 90 | Temp 98.1°F | Wt 234.0 lb

## 2019-06-18 DIAGNOSIS — G2581 Restless legs syndrome: Secondary | ICD-10-CM | POA: Diagnosis not present

## 2019-06-18 DIAGNOSIS — E559 Vitamin D deficiency, unspecified: Secondary | ICD-10-CM

## 2019-06-18 LAB — POTASSIUM: Potassium: 4.2 mEq/L (ref 3.5–5.1)

## 2019-06-18 LAB — HEMOGLOBIN A1C: Hgb A1c MFr Bld: 5.8 % (ref 4.6–6.5)

## 2019-06-18 LAB — VITAMIN B12: Vitamin B-12: 405 pg/mL (ref 211–911)

## 2019-06-18 LAB — VITAMIN D 25 HYDROXY (VIT D DEFICIENCY, FRACTURES): VITD: 22.69 ng/mL — ABNORMAL LOW (ref 30.00–100.00)

## 2019-06-18 LAB — MAGNESIUM: Magnesium: 2.1 mg/dL (ref 1.5–2.5)

## 2019-06-18 NOTE — Patient Instructions (Signed)

## 2019-06-18 NOTE — Telephone Encounter (Signed)
Pt has already arrived for appt

## 2019-06-18 NOTE — Telephone Encounter (Signed)
[facility] - Client Nonclinical Telephone Record [facility] Client [place] Night - Client Client Site [place] Physician Webb Silversmith - NP Contact Type Call Who Is Calling Patient / Member / Family / Caregiver Caller Name Varina Phone Number [phone] Patient Name Jonathan [name] Patient DOB [birth year] Call Type Message Only Information Provided Reason for Call Request for General Office Information Initial Comment Caller has an appointment. Wants to confirm appointment at 8:30 tomorrow the 6th. Additional Comment Call Closed By: Lawernce Keas Transaction Date/Time: 06/17/2019 5:25:44 PM (ET)

## 2019-06-18 NOTE — Progress Notes (Signed)
Subjective:    Patient ID: Jonathan [name], male    DOB: Jan 12, 1971, 48 y.o.   MRN: [id number]  HPI  Pt presents to the clinic today with c/o restless legs. This has been an ongoing issue. He describes the sensation as "crawling with some cramping and foot itching. He denies rash, burning sensation, numbness, tingling or weakness.  He is currently taking Requip with minimal relief.   Review of Systems      Past Medical History:  Diagnosis Date   Anxiety    Hypertension     Current Outpatient Medications  Medication Sig Dispense Refill   albuterol (PROVENTIL HFA;VENTOLIN HFA) 108 (90 Base) MCG/ACT inhaler Inhale 2 puffs into the lungs every 6 (six) hours as needed. 1 Inhaler 0   citalopram (CELEXA) 40 MG tablet TAKE 1 TABLET (40 MG TOTAL) BY MOUTH DAILY. MUST SCHEDULE PHYSICAL EXAM 90 tablet 0   hydrochlorothiazide (MICROZIDE) 12.5 MG capsule TAKE 1 CAPSULE (12.5 MG TOTAL) BY MOUTH DAILY. MUST SCHEDULE ANNUAL PHYSICAL 90 capsule 0   losartan (COZAAR) 100 MG tablet TAKE 1/2 TABLET (50 MG TOTAL) BY MOUTH DAILY. MUST SCHEDULE ANNUAL PHYSICAL EXAM 45 tablet 0   Multiple Vitamin (MULTIVITAMIN) tablet Take 1 tablet by mouth daily.     omeprazole (PRILOSEC) 40 MG capsule TAKE 1 CAPSULE (40 MG TOTAL) BY MOUTH DAILY. MUST SCHEDULE PHYSICAL EXAM 90 capsule 0   rOPINIRole (REQUIP) 0.25 MG tablet TAKE 1 TABLET AT 3PM, 1 TABLET AT BEDTIME 180 tablet 0   No current facility-administered medications for this visit.     Allergies  Allergen Reactions   Ace Inhibitors     Cough     Family History  Problem Relation Age of Onset   Cancer Mother        breast   Cancer Father        prostate    Social History   Socioeconomic History   Marital status: Married    Spouse name: Not on file   Number of children: 0   Years of education: Not on file   Highest education level: Not on file  Occupational History   Not on file  Social Needs   Financial resource strain:  Not on file   Food insecurity    Worry: Not on file    Inability: Not on file   Transportation needs    Medical: Not on file    Non-medical: Not on file  Tobacco Use   Smoking status: Former Smoker    Quit date: 11/12/1993    Years since quitting: 25.6   Smokeless tobacco: Current User    Types: Chew  Substance and Sexual Activity   Alcohol use: No    Alcohol/week: 0.0 standard drinks   Drug use: Not on file   Sexual activity: Not on file  Lifestyle   Physical activity    Days per week: Not on file    Minutes per session: Not on file   Stress: Not on file  Relationships   Social connections    Talks on phone: Not on file    Gets together: Not on file    Attends religious service: Not on file    Active member of club or organization: Not on file    Attends meetings of clubs or organizations: Not on file    Relationship status: Not on file   Intimate partner violence    Fear of current or ex partner: Not on file    Emotionally abused:  Not on file    Physically abused: Not on file    Forced sexual activity: Not on file  Other Topics Concern   Not on file  Social History Narrative   Not on file     Constitutional: Denies fever, malaise, fatigue, headache or abrupt weight changes.  Respiratory: Denies difficulty breathing, shortness of breath, cough or sputum production.   Cardiovascular: Denies chest pain, chest tightness, palpitations or swelling in the hands or feet.  Musculoskeletal: Pt reports muscle cramping. Denies decrease in range of motion, difficulty with gait, or joint pain and swelling.  Skin: Denies redness, rashes, lesions or ulcercations.  Neurological: Pt reports crawling, itching sensation of BLE. Denies numbness, tingling, weakness or problems with balance and coordination.    No other specific complaints in a complete review of systems (except as listed in HPI above).  Objective:   Physical Exam BP 122/82    Pulse 90    Temp 98.1 F  (36.7 C) (Temporal)    Wt 234 lb (106.1 kg)    SpO2 97%    BMI 35.06 kg/m  Wt Readings from Last 3 Encounters:  06/18/19 234 lb (106.1 kg)  11/25/18 228 lb (103.4 kg)  01/14/18 227 lb (103 kg)    General: Appears his stated age, obese, in NAD. Skin: Warm, dry and intact. No rashes noted. Cardiovascular: Normal rate and rhythm. S1,S2 noted.  No murmur, rubs or gallops noted. No JVD or BLE edema. Pulmonary/Chest: Normal effort and positive vesicular breath sounds. No respiratory distress. No wheezes, rales or ronchi noted.  Musculoskeletal: Strength 5/5 BLE. No joint swelling noted. No difficulty with gait. Neurological: Alert and oriented. Sensation intact to BLE.   BMET    Component Value Date/Time   NA 139 01/14/2018 1549   K 3.9 01/14/2018 1549   CL 101 01/14/2018 1549   CO2 30 01/14/2018 1549   GLUCOSE 78 01/14/2018 1549   BUN 14 01/14/2018 1549   CREATININE 0.96 01/14/2018 1549   CALCIUM 9.9 01/14/2018 1549   GFRNONAA 95.90 09/13/2010 1103    Lipid Panel     Component Value Date/Time   CHOL 167 01/14/2018 1549   TRIG 130.0 01/14/2018 1549   HDL 32.90 (L) 01/14/2018 1549   CHOLHDL 5 01/14/2018 1549   VLDL 26.0 01/14/2018 1549   LDLCALC 108 (H) 01/14/2018 1549    CBC    Component Value Date/Time   WBC 9.4 01/14/2018 1549   RBC 5.80 01/14/2018 1549   HGB 16.6 01/14/2018 1549   HCT 48.5 01/14/2018 1549   PLT 195.0 01/14/2018 1549   MCV 83.5 01/14/2018 1549   MCHC 34.3 01/14/2018 1549   RDW 13.0 01/14/2018 1549   LYMPHSABS 1.9 02/27/2016 1032   MONOABS 0.6 02/27/2016 1032   EOSABS 0.1 02/27/2016 1032   BASOSABS 0.0 02/27/2016 1032    Hgb A1C No results found for: HGBA1C           Assessment & Plan:   Restless Leg Syndrome:  Increase water intake Stretch daily Will check Mg, Potassium, Vit D, B12, A1C Will consider increase in Requip pending labs  Return precautions discussed, will follow up after labs Webb Silversmith, NP

## 2019-06-19 MED ORDER — VITAMIN D (ERGOCALCIFEROL) 1.25 MG (50000 UNIT) PO CAPS: 50000.0000 [IU] | ORAL_CAPSULE | ORAL | 0 refills | Status: DC

## 2019-06-19 MED ORDER — ROPINIROLE HCL 0.5 MG PO TABS: 0.5000 mg | ORAL_TABLET | Freq: Two times a day (BID) | ORAL | 2 refills | Status: DC

## 2019-06-19 NOTE — Addendum Note (Signed)
Addended by: Jearld Fenton on: 06/19/2019 01:05 PM   Modules accepted: Orders

## 2019-07-04 ENCOUNTER — Other Ambulatory Visit: Payer: Self-pay | Admitting: Internal Medicine

## 2019-07-29 ENCOUNTER — Other Ambulatory Visit: Payer: Self-pay | Admitting: Internal Medicine

## 2019-07-30 NOTE — Telephone Encounter (Signed)
Last filled 06/18/2019 30 day supply with refill... requesting 90 day supply has upcoming CPE... please advise

## 2019-08-20 ENCOUNTER — Other Ambulatory Visit: Payer: Self-pay

## 2019-08-20 ENCOUNTER — Ambulatory Visit (INDEPENDENT_AMBULATORY_CARE_PROVIDER_SITE_OTHER): Payer: Federal, State, Local not specified - PPO | Admitting: Internal Medicine

## 2019-08-20 ENCOUNTER — Encounter: Payer: Self-pay | Admitting: Internal Medicine

## 2019-08-20 VITALS — BP 118/80 | HR 84 | Temp 98.1°F | Ht 68.5 in | Wt 239.0 lb

## 2019-08-20 DIAGNOSIS — Z0000 Encounter for general adult medical examination without abnormal findings: Secondary | ICD-10-CM

## 2019-08-20 DIAGNOSIS — I10 Essential (primary) hypertension: Secondary | ICD-10-CM

## 2019-08-20 DIAGNOSIS — K219 Gastro-esophageal reflux disease without esophagitis: Secondary | ICD-10-CM | POA: Diagnosis not present

## 2019-08-20 DIAGNOSIS — F32A Depression, unspecified: Secondary | ICD-10-CM

## 2019-08-20 DIAGNOSIS — R7303 Prediabetes: Secondary | ICD-10-CM | POA: Insufficient documentation

## 2019-08-20 DIAGNOSIS — J45909 Unspecified asthma, uncomplicated: Secondary | ICD-10-CM | POA: Diagnosis not present

## 2019-08-20 DIAGNOSIS — F329 Major depressive disorder, single episode, unspecified: Secondary | ICD-10-CM

## 2019-08-20 DIAGNOSIS — F419 Anxiety disorder, unspecified: Secondary | ICD-10-CM

## 2019-08-20 DIAGNOSIS — G2581 Restless legs syndrome: Secondary | ICD-10-CM

## 2019-08-20 LAB — COMPREHENSIVE METABOLIC PANEL
ALT: 20 U/L (ref 0–53)
AST: 15 U/L (ref 0–37)
Albumin: 4.6 g/dL (ref 3.5–5.2)
Alkaline Phosphatase: 60 U/L (ref 39–117)
BUN: 12 mg/dL (ref 6–23)
CO2: 29 mEq/L (ref 19–32)
Calcium: 9.5 mg/dL (ref 8.4–10.5)
Chloride: 103 mEq/L (ref 96–112)
Creatinine, Ser: 0.91 mg/dL (ref 0.40–1.50)
GFR: 88.77 mL/min (ref >60.00)
Glucose, Bld: 102 mg/dL — ABNORMAL HIGH (ref 70–99)
Potassium: 4.1 mEq/L (ref 3.5–5.1)
Sodium: 139 mEq/L (ref 135–145)
Total Bilirubin: 0.7 mg/dL (ref 0.2–1.2)
Total Protein: 6.9 g/dL (ref 6.0–8.3)

## 2019-08-20 LAB — LIPID PANEL
Cholesterol: 165 mg/dL (ref 0–200)
HDL: 33.2 mg/dL — ABNORMAL LOW (ref >39.00)
LDL Cholesterol: 104 mg/dL — ABNORMAL HIGH (ref 0–99)
NonHDL: 131.97
Total CHOL/HDL Ratio: 5
Triglycerides: 140 mg/dL (ref 0.0–149.0)
VLDL: 28 mg/dL (ref 0.0–40.0)

## 2019-08-20 NOTE — Assessment & Plan Note (Signed)
Stable on Citalopram Support offered Will monitor

## 2019-08-20 NOTE — Patient Instructions (Signed)

## 2019-08-20 NOTE — Assessment & Plan Note (Signed)
Too early to repeat A1C Encouraged low carb diet and exercise for weight loss

## 2019-08-20 NOTE — Assessment & Plan Note (Signed)
Continue Losartan HCT CMET today Reinforced DASH diet and exercise for weight loss

## 2019-08-20 NOTE — Assessment & Plan Note (Signed)
Continue Requip Will monitor 

## 2019-08-20 NOTE — Progress Notes (Signed)
Subjective:    Patient ID: Jonathan [name], male    DOB: 1971/01/03, 48 y.o.   MRN: [id number]  HPI  Pt presents to the clinic today for his annual exam. He is also due to follow up chronic conditions.  HTN: His BP today is 118/80. He is taking Losartan HCT as prescribed. ECG from 05/2012 reviewed.  RLS: Improved since we increased the Requip 06/2019. He takes Tylenol as needed if it is really bad.  GERD: Triggered by tomato based foods. He occasionally has  breakthrough on Omeprazole, mostly in the middle of nigt. There is no upper GI on file  Asthma: Worse in the spring and fall. He will take an antihistamine OTC and Albuterol as needed. PFT's from 08/2009 reviewed.  Anxiety and Depression: Chronic but stable on Citalopram. He is not currently seeing a therapist. He denies SI/HI.  Prediabetes: His last A1C was 5.8%, 06/2019. He does not check his sugars.   Flu: never Tetanus: 02/2016 Vision Screening: annually Dentist: biannually  Diet: he does eat meat. He consumes fruits and veggies. He does not eat fried foods. He drinks mostly Mt. Dew and water. Exercise: yardwork  Review of Systems      Past Medical History:  Diagnosis Date  . Anxiety   . Hypertension     Current Outpatient Medications  Medication Sig Dispense Refill  . albuterol (PROVENTIL HFA;VENTOLIN HFA) 108 (90 Base) MCG/ACT inhaler Inhale 2 puffs into the lungs every 6 (six) hours as needed. 1 Inhaler 0  . citalopram (CELEXA) 40 MG tablet TAKE 1 TABLET (40 MG TOTAL) BY MOUTH DAILY. MUST SCHEDULE PHYSICAL EXAM 90 tablet 0  . hydrochlorothiazide (MICROZIDE) 12.5 MG capsule TAKE 1 CAPSULE (12.5 MG TOTAL) BY MOUTH DAILY. MUST SCHEDULE ANNUAL PHYSICAL 90 capsule 0  . losartan (COZAAR) 100 MG tablet TAKE 1/2 TABLET (50 MG TOTAL) BY MOUTH DAILY. MUST SCHEDULE ANNUAL PHYSICAL EXAM 45 tablet 0  . Multiple Vitamin (MULTIVITAMIN) tablet Take 1 tablet by mouth daily.    Marland Kitchen omeprazole (PRILOSEC) 40 MG capsule TAKE 1  CAPSULE (40 MG TOTAL) BY MOUTH DAILY. MUST SCHEDULE PHYSICAL EXAM 90 capsule 0  . rOPINIRole (REQUIP) 0.25 MG tablet TAKE 1 TABLET AT 3PM, 1 TABLET AT BEDTIME 180 tablet 0  . rOPINIRole (REQUIP) 0.5 MG tablet Take 1 tablet (0.5 mg total) by mouth 2 (two) times daily. 60 tablet 2  . Vitamin D, Ergocalciferol, (DRISDOL) 1.25 MG (50000 UT) CAPS capsule Take 1 capsule (50,000 Units total) by mouth every 7 (seven) days. 12 capsule 0   No current facility-administered medications for this visit.     Allergies  Allergen Reactions  . Ace Inhibitors     Cough     Family History  Problem Relation Age of Onset  . Cancer Mother        breast  . Cancer Father        prostate    Social History   Socioeconomic History  . Marital status: Married    Spouse name: Not on file  . Number of children: 0  . Years of education: Not on file  . Highest education level: Not on file  Occupational History  . Not on file  Social Needs  . Financial resource strain: Not on file  . Food insecurity    Worry: Not on file    Inability: Not on file  . Transportation needs    Medical: Not on file    Non-medical: Not on file  Tobacco Use  .  Smoking status: Former Smoker    Quit date: 11/12/1993    Years since quitting: 25.7  . Smokeless tobacco: Current User    Types: Chew  Substance and Sexual Activity  . Alcohol use: No    Alcohol/week: 0.0 standard drinks  . Drug use: Not on file  . Sexual activity: Not on file  Lifestyle  . Physical activity    Days per week: Not on file    Minutes per session: Not on file  . Stress: Not on file  Relationships  . Social Herbalist on phone: Not on file    Gets together: Not on file    Attends religious service: Not on file    Active member of club or organization: Not on file    Attends meetings of clubs or organizations: Not on file    Relationship status: Not on file  . Intimate partner violence    Fear of current or ex partner: Not on file     Emotionally abused: Not on file    Physically abused: Not on file    Forced sexual activity: Not on file  Other Topics Concern  . Not on file  Social History Narrative  . Not on file     Constitutional: Pt reports fatigue. Denies fever, malaise, headache or abrupt weight changes.  HEENT: Denies eye pain, eye redness, ear pain, ringing in the ears, wax buildup, runny nose, nasal congestion, bloody nose, or sore throat. Respiratory: Denies difficulty breathing, shortness of breath, cough or sputum production.   Cardiovascular: Denies chest pain, chest tightness, palpitations or swelling in the hands or feet.  Gastrointestinal: Pt reports intermittent reflux. Denies abdominal pain, bloating, constipation, diarrhea or blood in the stool.  GU: Denies urgency, frequency, pain with urination, burning sensation, blood in urine, odor or discharge. Musculoskeletal: Pt reports intermittent shoulder pain. Denies decrease in range of motion, difficulty with gait, muscle pain or joint swelling.  Skin: Denies redness, rashes, lesions or ulcercations.  Neurological: Pt reports restless legs. Denies dizziness, difficulty with memory, difficulty with speech or problems with balance and coordination.  Psych: Pt has a history of anxiety and depression. Denies  SI/HI.  No other specific complaints in a complete review of systems (except as listed in HPI above).  Objective:   Physical Exam   BP 118/80   Pulse 84   Temp 98.1 F (36.7 C) (Temporal)   Ht 5' 8.5" (1.74 m)   Wt 239 lb (108.4 kg)   SpO2 98%   BMI 35.81 kg/m  Wt Readings from Last 3 Encounters:  08/20/19 239 lb (108.4 kg)  06/18/19 234 lb (106.1 kg)  11/25/18 228 lb (103.4 kg)    General: Appears his stated age, obese, in NAD. Skin: Warm, dry and intact. No rashes noted. HEENT: Head: normal shape and size; Eyes: sclera white, no icterus, conjunctiva pink, PERRLA and EOMs intact; Ears: Tm's gray and intact, normal light reflex;   Neck:  Neck supple, trachea midline. No masses, lumps or thyromegaly present.  Cardiovascular: Normal rate and rhythm. S1,S2 noted.  No murmur, rubs or gallops noted. No JVD or BLE edema.  Pulmonary/Chest: Normal effort and positive vesicular breath sounds. No respiratory distress. No wheezes, rales or ronchi noted.  Abdomen: Soft and nontender. Normal bowel sounds. No distention or masses noted. Liver, spleen and kidneys non palpable. Musculoskeletal: Strength 5/5 BUE/BLE/ No difficulty with gait.  Neurological: Alert and oriented. Cranial nerves II-XII grossly intact. Coordination normal.  Psychiatric: Mood  and affect normal. Behavior is normal. Judgment and thought content normal.    BMET    Component Value Date/Time   NA 139 01/14/2018 1549   K 4.2 06/18/2019 0925   CL 101 01/14/2018 1549   CO2 30 01/14/2018 1549   GLUCOSE 78 01/14/2018 1549   BUN 14 01/14/2018 1549   CREATININE 0.96 01/14/2018 1549   CALCIUM 9.9 01/14/2018 1549   GFRNONAA 95.90 09/13/2010 1103    Lipid Panel     Component Value Date/Time   CHOL 167 01/14/2018 1549   TRIG 130.0 01/14/2018 1549   HDL 32.90 (L) 01/14/2018 1549   CHOLHDL 5 01/14/2018 1549   VLDL 26.0 01/14/2018 1549   LDLCALC 108 (H) 01/14/2018 1549    CBC    Component Value Date/Time   WBC 9.4 01/14/2018 1549   RBC 5.80 01/14/2018 1549   HGB 16.6 01/14/2018 1549   HCT 48.5 01/14/2018 1549   PLT 195.0 01/14/2018 1549   MCV 83.5 01/14/2018 1549   MCHC 34.3 01/14/2018 1549   RDW 13.0 01/14/2018 1549   LYMPHSABS 1.9 02/27/2016 1032   MONOABS 0.6 02/27/2016 1032   EOSABS 0.1 02/27/2016 1032   BASOSABS 0.0 02/27/2016 1032    Hgb A1C Lab Results  Component Value Date   HGBA1C 5.8 06/18/2019           Assessment & Plan:   Preventative Health Maintenance:  He declines flu shot today Tetanus UTD Encouraged him to consume a balanced diet and exercise regimen Advised him to see an eye doctor and dentist annually Will check  CBC, CMET, Lipid, A1C today  RTC in 6 moths follow up chronic conditions, sooner if needed Webb Silversmith, NP

## 2019-08-20 NOTE — Assessment & Plan Note (Signed)
Continue antihistamine and Albuterol Will monitor 

## 2019-08-20 NOTE — Assessment & Plan Note (Signed)
Discussed avoiding foods that trigger weight loss Discussed avoiding eating and laying down Discussed how weight loss could help improve reflux CBC and CMET today Continue Omeprazole

## 2019-08-21 LAB — CBC
HCT: 46.8 % (ref 39.0–52.0)
Hemoglobin: 15.9 g/dL (ref 13.0–17.0)
MCHC: 34.1 g/dL (ref 30.0–36.0)
MCV: 84.5 fl (ref 78.0–100.0)
Platelets: 198 10*3/uL (ref 150.0–400.0)
RBC: 5.53 Mil/uL (ref 4.22–5.81)
RDW: 13 % (ref 11.5–15.5)
WBC: 6.9 10*3/uL (ref 4.0–10.5)

## 2019-09-03 ENCOUNTER — Other Ambulatory Visit: Payer: Self-pay | Admitting: Internal Medicine

## 2019-09-10 ENCOUNTER — Other Ambulatory Visit: Payer: Self-pay | Admitting: Internal Medicine

## 2019-09-17 DIAGNOSIS — D2261 Melanocytic nevi of right upper limb, including shoulder: Secondary | ICD-10-CM | POA: Diagnosis not present

## 2019-09-17 DIAGNOSIS — D2272 Melanocytic nevi of left lower limb, including hip: Secondary | ICD-10-CM | POA: Diagnosis not present

## 2019-09-17 DIAGNOSIS — D225 Melanocytic nevi of trunk: Secondary | ICD-10-CM | POA: Diagnosis not present

## 2019-09-17 DIAGNOSIS — D2271 Melanocytic nevi of right lower limb, including hip: Secondary | ICD-10-CM | POA: Diagnosis not present

## 2019-10-09 ENCOUNTER — Other Ambulatory Visit: Payer: Self-pay | Admitting: Internal Medicine

## 2019-10-09 MED ORDER — LOSARTAN POTASSIUM 100 MG PO TABS: ORAL_TABLET | ORAL | 1 refills | Status: DC

## 2020-01-19 ENCOUNTER — Other Ambulatory Visit: Payer: Self-pay | Admitting: Internal Medicine

## 2020-01-26 ENCOUNTER — Other Ambulatory Visit: Payer: Self-pay

## 2020-01-26 ENCOUNTER — Ambulatory Visit: Payer: Federal, State, Local not specified - PPO | Admitting: Internal Medicine

## 2020-01-26 ENCOUNTER — Encounter: Payer: Self-pay | Admitting: Internal Medicine

## 2020-01-26 DIAGNOSIS — G2581 Restless legs syndrome: Secondary | ICD-10-CM | POA: Diagnosis not present

## 2020-01-26 MED ORDER — ROPINIROLE HCL 0.5 MG PO TABS: 0.5000 mg | ORAL_TABLET | Freq: Three times a day (TID) | ORAL | 0 refills | Status: DC

## 2020-01-26 MED ORDER — ALBUTEROL SULFATE HFA 108 (90 BASE) MCG/ACT IN AERS: 2.0000 | INHALATION_SPRAY | Freq: Four times a day (QID) | RESPIRATORY_TRACT | 1 refills | Status: DC | PRN

## 2020-01-26 NOTE — Assessment & Plan Note (Addendum)
Will increase Ropinerole to 0.5 mg PO TID Encouraged regular stretching

## 2020-01-26 NOTE — Patient Instructions (Signed)

## 2020-01-26 NOTE — Progress Notes (Signed)
Subjective:    Patient ID: Jonathan [name], male    DOB: November 05, 1971, 49 y.o.   MRN: [id number]  HPI  Pt presents to the clinic today for follow up of restless legs. He is currently managed on Ropinerole 0.5 mg in the am and pm and then 1 mg at bedtime. He reports this current regimen is working for him but he is running out of his medication sooner than he should.  Review of Systems      Past Medical History:  Diagnosis Date  . Anxiety   . Hypertension     Current Outpatient Medications  Medication Sig Dispense Refill  . albuterol (PROVENTIL HFA;VENTOLIN HFA) 108 (90 Base) MCG/ACT inhaler Inhale 2 puffs into the lungs every 6 (six) hours as needed. 1 Inhaler 0  . citalopram (CELEXA) 40 MG tablet Take 1 tablet (40 mg total) by mouth daily. 90 tablet 1  . hydrochlorothiazide (MICROZIDE) 12.5 MG capsule Take 1 capsule (12.5 mg total) by mouth daily. 90 capsule 1  . losartan (COZAAR) 100 MG tablet TAKE 1/2 TABLET (50 MG TOTAL) BY MOUTH DAILY 45 tablet 1  . Multiple Vitamin (MULTIVITAMIN) tablet Take 1 tablet by mouth daily.    Marland Kitchen omeprazole (PRILOSEC) 40 MG capsule Take 1 capsule (40 mg total) by mouth daily. 90 capsule 2  . rOPINIRole (REQUIP) 0.5 MG tablet TAKE 1 TABLET (0.5 MG TOTAL) BY MOUTH 2 (TWO) TIMES DAILY. 180 tablet 0  . Vitamin D, Ergocalciferol, (DRISDOL) 1.25 MG (50000 UT) CAPS capsule Take 1 capsule (50,000 Units total) by mouth every 7 (seven) days. 12 capsule 0   No current facility-administered medications for this visit.    Allergies  Allergen Reactions  . Ace Inhibitors     Cough     Family History  Problem Relation Age of Onset  . Cancer Mother        breast  . Cancer Father        prostate    Social History   Socioeconomic History  . Marital status: Married    Spouse name: Not on file  . Number of children: 0  . Years of education: Not on file  . Highest education level: Not on file  Occupational History  . Not on file  Tobacco Use  .  Smoking status: Former Smoker    Quit date: 11/12/1993    Years since quitting: 26.2  . Smokeless tobacco: Current User    Types: Chew  Substance and Sexual Activity  . Alcohol use: No    Alcohol/week: 0.0 standard drinks  . Drug use: Not on file  . Sexual activity: Not on file  Other Topics Concern  . Not on file  Social History Narrative  . Not on file   Social Determinants of Health   Financial Resource Strain:   . Difficulty of Paying Living Expenses:   Food Insecurity:   . Worried About Charity fundraiser in the Last Year:   . Arboriculturist in the Last Year:   Transportation Needs:   . Film/video editor (Medical):   Marland Kitchen Lack of Transportation (Non-Medical):   Physical Activity:   . Days of Exercise per Week:   . Minutes of Exercise per Session:   Stress:   . Feeling of Stress :   Social Connections:   . Frequency of Communication with Friends and Family:   . Frequency of Social Gatherings with Friends and Family:   . Attends Religious Services:   .  Active Member of Clubs or Organizations:   . Attends Archivist Meetings:   Marland Kitchen Marital Status:   Intimate Partner Violence:   . Fear of Current or Ex-Partner:   . Emotionally Abused:   Marland Kitchen Physically Abused:   . Sexually Abused:      Constitutional: Denies fever, malaise, fatigue, headache or abrupt weight changes.  Respiratory: Denies difficulty breathing, shortness of breath, cough or sputum production.   Cardiovascular: Denies chest pain, chest tightness, palpitations or swelling in the hands or feet.  Musculoskeletal: Denies decrease in range of motion, difficulty with gait, muscle pain or joint pain and swelling.  Skin: Denies redness, rashes, lesions or ulcercations.  Neurological: Pt reports restless legs. Denies dizziness, difficulty with memory, difficulty with speech or problems with balance and coordination.    No other specific complaints in a complete review of systems (except as listed in  HPI above).  Objective:   Physical Exam  BP 124/80   Pulse 80   Temp 98.1 F (36.7 C) (Temporal)   Wt 245 lb (111.1 kg)   SpO2 98%   BMI 36.71 kg/m   Wt Readings from Last 3 Encounters:  08/20/19 239 lb (108.4 kg)  06/18/19 234 lb (106.1 kg)  11/25/18 228 lb (103.4 kg)    General: Appears his stated age, obese, in NAD. Cardiovascular: Normal rate and rhythm. S1,S2 noted.  No murmur, rubs or gallops noted. No lower extremity edema.  Pulmonary/Chest: Normal effort and positive vesicular breath sounds. No respiratory distress. No wheezes, rales or ronchi noted.  Musculoskeletal: Strength 5/5 BUE/BLE. No difficulty with gait.  Neurological: Alert and oriented. Cranial nerves II-XII grossly intact. Coordination normal.    BMET    Component Value Date/Time   NA 139 08/20/2019 1016   K 4.1 08/20/2019 1016   CL 103 08/20/2019 1016   CO2 29 08/20/2019 1016   GLUCOSE 102 (H) 08/20/2019 1016   BUN 12 08/20/2019 1016   CREATININE 0.91 08/20/2019 1016   CALCIUM 9.5 08/20/2019 1016   GFRNONAA 95.90 09/13/2010 1103    Lipid Panel     Component Value Date/Time   CHOL 165 08/20/2019 1016   TRIG 140.0 08/20/2019 1016   HDL 33.20 (L) 08/20/2019 1016   CHOLHDL 5 08/20/2019 1016   VLDL 28.0 08/20/2019 1016   LDLCALC 104 (H) 08/20/2019 1016    CBC    Component Value Date/Time   WBC 6.9 08/20/2019 1016   RBC 5.53 08/20/2019 1016   HGB 15.9 08/20/2019 1016   HCT 46.8 08/20/2019 1016   PLT 198.0 08/20/2019 1016   MCV 84.5 08/20/2019 1016   MCHC 34.1 08/20/2019 1016   RDW 13.0 08/20/2019 1016   LYMPHSABS 1.9 02/27/2016 1032   MONOABS 0.6 02/27/2016 1032   EOSABS 0.1 02/27/2016 1032   BASOSABS 0.0 02/27/2016 1032    Hgb A1C Lab Results  Component Value Date   HGBA1C 5.8 06/18/2019            Assessment & Plan:    Webb Silversmith, NP This visit occurred during the SARS-CoV-2 public health emergency.  Safety protocols were in place, including screening questions  prior to the visit, additional usage of staff PPE, and extensive cleaning of exam room while observing appropriate contact time as indicated for disinfecting solutions.

## 2020-02-18 ENCOUNTER — Ambulatory Visit: Payer: Federal, State, Local not specified - PPO | Admitting: Internal Medicine

## 2020-04-19 ENCOUNTER — Ambulatory Visit: Payer: Federal, State, Local not specified - PPO | Admitting: Internal Medicine

## 2020-05-08 ENCOUNTER — Other Ambulatory Visit: Payer: Self-pay | Admitting: Internal Medicine

## 2020-05-19 ENCOUNTER — Ambulatory Visit: Payer: Federal, State, Local not specified - PPO | Admitting: Internal Medicine

## 2020-05-19 ENCOUNTER — Other Ambulatory Visit: Payer: Self-pay

## 2020-05-19 ENCOUNTER — Encounter: Payer: Self-pay | Admitting: Internal Medicine

## 2020-05-19 DIAGNOSIS — F32A Depression, unspecified: Secondary | ICD-10-CM

## 2020-05-19 DIAGNOSIS — R7303 Prediabetes: Secondary | ICD-10-CM

## 2020-05-19 DIAGNOSIS — F329 Major depressive disorder, single episode, unspecified: Secondary | ICD-10-CM

## 2020-05-19 DIAGNOSIS — K219 Gastro-esophageal reflux disease without esophagitis: Secondary | ICD-10-CM

## 2020-05-19 DIAGNOSIS — I10 Essential (primary) hypertension: Secondary | ICD-10-CM

## 2020-05-19 DIAGNOSIS — F419 Anxiety disorder, unspecified: Secondary | ICD-10-CM

## 2020-05-19 DIAGNOSIS — G2581 Restless legs syndrome: Secondary | ICD-10-CM

## 2020-05-19 DIAGNOSIS — J45909 Unspecified asthma, uncomplicated: Secondary | ICD-10-CM

## 2020-05-19 MED ORDER — ALPRAZOLAM 0.25 MG PO TABS
0.2500 mg | ORAL_TABLET | Freq: Every day | ORAL | 0 refills | Status: DC | PRN
Start: 2020-05-19 — End: 2021-01-31

## 2020-05-19 NOTE — Assessment & Plan Note (Signed)
Continue Citalopram RX for limited Xanax for flight anxiety- addiction caution given

## 2020-05-19 NOTE — Assessment & Plan Note (Signed)
Continue Losartan and HCTZ Will monitor

## 2020-05-19 NOTE — Patient Instructions (Signed)

## 2020-05-19 NOTE — Progress Notes (Signed)
Subjective:    Patient ID: Jonathan [name], male    DOB: 1971/08/13, 49 y.o.   MRN: [id number]  HPI  Patient presents the clinic today for 51-month follow-up of chronic conditions.  HTN: His BP today is 126/74.  He is taking Losartan HCT as prescribed.  ECG from 05/2012 reviewed.  RLS: Managed on Requip.  GERD: Triggered by tomato-based foods.  He intermittently has breakthrough on Omeprazole.  There is no upper GI on file.  Asthma: Worse in the spring and the fall.  He will take an antihistamine OTC and Albuterol as needed.  PFTs from 08/2009 reviewed.  Anxiety and Depression: Chronic, stable on Citalopram. He is flying in September and would like something to take for flight anxiety. He is not currently seeing a therapist.  He denies SI/HI.  Prediabetes: His last A1c was 5.8, 06/2019.  He does not check his sugars.  Review of Systems   Past Medical History:  Diagnosis Date  . Anxiety   . Hypertension     Current Outpatient Medications  Medication Sig Dispense Refill  . albuterol (VENTOLIN HFA) 108 (90 Base) MCG/ACT inhaler Inhale 2 puffs into the lungs every 6 (six) hours as needed. 8 g 1  . citalopram (CELEXA) 40 MG tablet TAKE 1 TABLET BY MOUTH EVERY DAY 90 tablet 0  . hydrochlorothiazide (MICROZIDE) 12.5 MG capsule TAKE 1 CAPSULE BY MOUTH EVERY DAY 90 capsule 0  . losartan (COZAAR) 100 MG tablet TAKE 1/2 TABLET (50 MG TOTAL) BY MOUTH DAILY 45 tablet 0  . Multiple Vitamin (MULTIVITAMIN) tablet Take 1 tablet by mouth daily.    Marland Kitchen omeprazole (PRILOSEC) 40 MG capsule Take 1 capsule (40 mg total) by mouth daily. 90 capsule 2  . rOPINIRole (REQUIP) 0.5 MG tablet Take 1 tablet (0.5 mg total) by mouth 3 (three) times daily. 270 tablet 0   No current facility-administered medications for this visit.    Allergies  Allergen Reactions  . Ace Inhibitors     Cough     Family History  Problem Relation Age of Onset  . Cancer Mother        breast  . Cancer Father         prostate    Social History   Socioeconomic History  . Marital status: Married    Spouse name: Not on file  . Number of children: 0  . Years of education: Not on file  . Highest education level: Not on file  Occupational History  . Not on file  Tobacco Use  . Smoking status: Former Smoker    Quit date: 11/12/1993    Years since quitting: 26.5  . Smokeless tobacco: Current User    Types: Chew  Substance and Sexual Activity  . Alcohol use: No    Alcohol/week: 0.0 standard drinks  . Drug use: Not on file  . Sexual activity: Not on file  Other Topics Concern  . Not on file  Social History Narrative  . Not on file   Social Determinants of Health   Financial Resource Strain:   . Difficulty of Paying Living Expenses:   Food Insecurity:   . Worried About Charity fundraiser in the Last Year:   . Arboriculturist in the Last Year:   Transportation Needs:   . Film/video editor (Medical):   Marland Kitchen Lack of Transportation (Non-Medical):   Physical Activity:   . Days of Exercise per Week:   . Minutes of Exercise per Session:  Stress:   . Feeling of Stress :   Social Connections:   . Frequency of Communication with Friends and Family:   . Frequency of Social Gatherings with Friends and Family:   . Attends Religious Services:   . Active Member of Clubs or Organizations:   . Attends Archivist Meetings:   Marland Kitchen Marital Status:   Intimate Partner Violence:   . Fear of Current or Ex-Partner:   . Emotionally Abused:   Marland Kitchen Physically Abused:   . Sexually Abused:      Constitutional: Denies fever, malaise, fatigue, headache or abrupt weight changes.  HEENT: Denies eye pain, eye redness, ear pain, ringing in the ears, wax buildup, runny nose, nasal congestion, bloody nose, or sore throat. Respiratory: Denies difficulty breathing, shortness of breath, cough or sputum production.   Cardiovascular: Denies chest pain, chest tightness, palpitations or swelling in the hands or feet.   Gastrointestinal: Pt reports intermittent reflux. Denies abdominal pain, bloating, constipation, diarrhea or blood in the stool.  GU: Denies urgency, frequency, pain with urination, burning sensation, blood in urine, odor or discharge. Musculoskeletal: Pt reports restless legs. Denies decrease in range of motion, difficulty with gait, or joint pain and swelling.  Skin: Denies redness, rashes, lesions or ulcercations.  Neurological: Denies dizziness, difficulty with memory, difficulty with speech or problems with balance and coordination.  Psych: Pt has a history of anxiety and depression. Denies SI/HI.  No other specific complaints in a complete review of systems (except as listed in HPI above).   Objective:   Physical Exam   BP 126/84   Pulse 94   Temp 98.3 F (36.8 C) (Temporal)   Wt 242 lb (109.8 kg)   SpO2 98%   BMI 36.26 kg/m   Wt Readings from Last 3 Encounters:  01/26/20 245 lb (111.1 kg)  08/20/19 239 lb (108.4 kg)  06/18/19 234 lb (106.1 kg)    General: Appears his stated age, obese, in NAD. Cardiovascular: Normal rate and rhythm. S1,S2 noted.  No murmur, rubs or gallops noted. Pulmonary/Chest: Normal effort and positive vesicular breath sounds. No respiratory distress. No wheezes, rales or ronchi noted.  Abdomen: . Normal bowel sounds.  Neurological: Alert and oriented.  Psychiatric: Mood and affect normal. Behavior is normal. Judgment and thought content normal.     BMET    Component Value Date/Time   NA 139 08/20/2019 1016   K 4.1 08/20/2019 1016   CL 103 08/20/2019 1016   CO2 29 08/20/2019 1016   GLUCOSE 102 (H) 08/20/2019 1016   BUN 12 08/20/2019 1016   CREATININE 0.91 08/20/2019 1016   CALCIUM 9.5 08/20/2019 1016   GFRNONAA 95.90 09/13/2010 1103    Lipid Panel     Component Value Date/Time   CHOL 165 08/20/2019 1016   TRIG 140.0 08/20/2019 1016   HDL 33.20 (L) 08/20/2019 1016   CHOLHDL 5 08/20/2019 1016   VLDL 28.0 08/20/2019 1016   LDLCALC  104 (H) 08/20/2019 1016    CBC    Component Value Date/Time   WBC 6.9 08/20/2019 1016   RBC 5.53 08/20/2019 1016   HGB 15.9 08/20/2019 1016   HCT 46.8 08/20/2019 1016   PLT 198.0 08/20/2019 1016   MCV 84.5 08/20/2019 1016   MCHC 34.1 08/20/2019 1016   RDW 13.0 08/20/2019 1016   LYMPHSABS 1.9 02/27/2016 1032   MONOABS 0.6 02/27/2016 1032   EOSABS 0.1 02/27/2016 1032   BASOSABS 0.0 02/27/2016 1032    Hgb A1C Lab Results  Component Value Date   HGBA1C 5.8 06/18/2019           Assessment & Plan:    Webb Silversmith, NP This visit occurred during the SARS-CoV-2 public health emergency.  Safety protocols were in place, including screening questions prior to the visit, additional usage of staff PPE, and extensive cleaning of exam room while observing appropriate contact time as indicated for disinfecting solutions.

## 2020-05-19 NOTE — Assessment & Plan Note (Signed)
Continue Requip Will monitor 

## 2020-05-19 NOTE — Assessment & Plan Note (Signed)
Continue Omeprazole Ok to take Tums, Rolaids, Maalox and Mylanta

## 2020-05-19 NOTE — Assessment & Plan Note (Signed)
Will check A1C at annual exam Consume a low carb diet

## 2020-05-19 NOTE — Assessment & Plan Note (Signed)
Continue antihistamine and Albuterol Will monitor

## 2020-05-31 ENCOUNTER — Other Ambulatory Visit: Payer: Self-pay | Admitting: Internal Medicine

## 2020-08-08 ENCOUNTER — Other Ambulatory Visit: Payer: Self-pay | Admitting: Internal Medicine

## 2020-08-20 ENCOUNTER — Other Ambulatory Visit: Payer: Self-pay | Admitting: Internal Medicine

## 2020-09-01 ENCOUNTER — Encounter: Payer: Self-pay | Admitting: Internal Medicine

## 2020-09-01 ENCOUNTER — Other Ambulatory Visit: Payer: Self-pay

## 2020-09-01 ENCOUNTER — Ambulatory Visit (INDEPENDENT_AMBULATORY_CARE_PROVIDER_SITE_OTHER): Payer: Federal, State, Local not specified - PPO | Admitting: Internal Medicine

## 2020-09-01 VITALS — BP 126/84 | HR 86 | Temp 97.8°F | Resp 16 | Ht 68.5 in | Wt 242.1 lb

## 2020-09-01 DIAGNOSIS — G2581 Restless legs syndrome: Secondary | ICD-10-CM

## 2020-09-01 DIAGNOSIS — Z0000 Encounter for general adult medical examination without abnormal findings: Secondary | ICD-10-CM

## 2020-09-01 DIAGNOSIS — Z1211 Encounter for screening for malignant neoplasm of colon: Secondary | ICD-10-CM

## 2020-09-01 DIAGNOSIS — I10 Essential (primary) hypertension: Secondary | ICD-10-CM | POA: Diagnosis not present

## 2020-09-01 DIAGNOSIS — K219 Gastro-esophageal reflux disease without esophagitis: Secondary | ICD-10-CM

## 2020-09-01 DIAGNOSIS — R7303 Prediabetes: Secondary | ICD-10-CM

## 2020-09-01 DIAGNOSIS — F32A Depression, unspecified: Secondary | ICD-10-CM

## 2020-09-01 DIAGNOSIS — R5383 Other fatigue: Secondary | ICD-10-CM

## 2020-09-01 DIAGNOSIS — F419 Anxiety disorder, unspecified: Secondary | ICD-10-CM | POA: Diagnosis not present

## 2020-09-01 LAB — LIPID PANEL
Cholesterol: 170 mg/dL (ref 0–200)
HDL: 33.9 mg/dL — ABNORMAL LOW (ref >39.00)
LDL Cholesterol: 98 mg/dL (ref 0–99)
NonHDL: 135.93
Total CHOL/HDL Ratio: 5
Triglycerides: 189 mg/dL — ABNORMAL HIGH (ref 0.0–149.0)
VLDL: 37.8 mg/dL (ref 0.0–40.0)

## 2020-09-01 LAB — HEMOGLOBIN A1C: Hgb A1c MFr Bld: 5.9 % (ref 4.6–6.5)

## 2020-09-01 LAB — VITAMIN D 25 HYDROXY (VIT D DEFICIENCY, FRACTURES): VITD: 19.04 ng/mL — ABNORMAL LOW (ref 30.00–100.00)

## 2020-09-01 LAB — CBC
HCT: 45.1 % (ref 39.0–52.0)
Hemoglobin: 15.5 g/dL (ref 13.0–17.0)
MCHC: 34.3 g/dL (ref 30.0–36.0)
MCV: 83.6 fl (ref 78.0–100.0)
Platelets: 192 10*3/uL (ref 150.0–400.0)
RBC: 5.4 Mil/uL (ref 4.22–5.81)
RDW: 13 % (ref 11.5–15.5)
WBC: 7.6 10*3/uL (ref 4.0–10.5)

## 2020-09-01 LAB — VITAMIN B12: Vitamin B-12: 393 pg/mL (ref 211–911)

## 2020-09-01 LAB — TSH: TSH: 2.24 u[IU]/mL (ref 0.35–4.50)

## 2020-09-01 MED ORDER — ROPINIROLE HCL 1 MG PO TABS: 1.0000 mg | ORAL_TABLET | Freq: Three times a day (TID) | ORAL | 0 refills | Status: DC

## 2020-09-01 NOTE — Progress Notes (Signed)
Subjective:    Patient ID: Jonathan [name], male    DOB: [birth year], 49 y.o.   MRN: [id number]  HPI   Pt presents to the clinic today for his annual exam. He is also due to follow up chronic conditions.  Anxiety and Depression: Managed on Citalopram. He takes Xanax for flying. He is not currently seeing a therapist. He denies SI/HI.  HTN: His BP today is 126/84. He is taking Losartan and HCT as prescribed. ECG from 05/2012 reviewed.  GERD: He denies breakthrough on Omeprazole. There is no upper GI on file.  RLS: He reports this is a persistent issue. He is taking Ropinerole as prescribed. He is having to take higher doses and would like a new RX for this today.  Prediabetes: His last A1C was 5.8%, 07/2019. He does not check his sugars. He is not taking any oral diabetic medication at this time  Flu: never Tetanus: 02/2016 Covid: Moderna Colon Screening: never Vision Screening: annually Dentist: bianually  Diet: He does eat meat. He consumes some fruits and veggies. He does eat some fried foods. He drinks mostly Mt. Dew, water. Exercise: Working out in the garage  Review of Systems  Past Medical History:  Diagnosis Date  . Anxiety   . Hypertension     Current Outpatient Medications  Medication Sig Dispense Refill  . albuterol (VENTOLIN HFA) 108 (90 Base) MCG/ACT inhaler Inhale 2 puffs into the lungs every 6 (six) hours as needed. 8 g 1  . citalopram (CELEXA) 40 MG tablet TAKE 1 TABLET BY MOUTH EVERY DAY 90 tablet 0  . hydrochlorothiazide (MICROZIDE) 12.5 MG capsule TAKE 1 CAPSULE BY MOUTH EVERY DAY 90 capsule 0  . losartan (COZAAR) 100 MG tablet TAKE 1/2 TABLET (50 MG TOTAL) BY MOUTH DAILY 45 tablet 0  . Multiple Vitamin (MULTIVITAMIN) tablet Take 1 tablet by mouth daily.    Marland Kitchen omeprazole (PRILOSEC) 40 MG capsule TAKE 1 CAPSULE BY MOUTH EVERY DAY 90 capsule 0  . rOPINIRole (REQUIP) 0.5 MG tablet TAKE 1 TABLET (0.5 MG TOTAL) BY MOUTH 3 (THREE) TIMES DAILY. (Patient taking  differently: Take 0.5 mg by mouth 5 (five) times daily. 1 tab in the morning, 2 tabs in the afternoon and 2 tabs nightly.) 270 tablet 0  . ALPRAZolam (XANAX) 0.25 MG tablet Take 1 tablet (0.25 mg total) by mouth daily as needed for anxiety. (Patient not taking: Reported on 09/01/2020) 10 tablet 0   No current facility-administered medications for this visit.    Allergies  Allergen Reactions  . Ace Inhibitors     Cough     Family History  Problem Relation Age of Onset  . Cancer Mother        breast  . Cancer Father        prostate    Social History   Socioeconomic History  . Marital status: Married    Spouse name: Not on file  . Number of children: 0  . Years of education: Not on file  . Highest education level: Not on file  Occupational History  . Not on file  Tobacco Use  . Smoking status: Former Smoker    Quit date: 11/12/1993    Years since quitting: 26.8  . Smokeless tobacco: Current User    Types: Chew  Substance and Sexual Activity  . Alcohol use: No    Alcohol/week: 0.0 standard drinks  . Drug use: Not on file  . Sexual activity: Not on file  Other Topics Concern  .  Not on file  Social History Narrative  . Not on file   Social Determinants of Health   Financial Resource Strain:   . Difficulty of Paying Living Expenses: Not on file  Food Insecurity:   . Worried About Charity fundraiser in the Last Year: Not on file  . Ran Out of Food in the Last Year: Not on file  Transportation Needs:   . Lack of Transportation (Medical): Not on file  . Lack of Transportation (Non-Medical): Not on file  Physical Activity:   . Days of Exercise per Week: Not on file  . Minutes of Exercise per Session: Not on file  Stress:   . Feeling of Stress : Not on file  Social Connections:   . Frequency of Communication with Friends and Family: Not on file  . Frequency of Social Gatherings with Friends and Family: Not on file  . Attends Religious Services: Not on file  .  Active Member of Clubs or Organizations: Not on file  . Attends Archivist Meetings: Not on file  . Marital Status: Not on file  Intimate Partner Violence:   . Fear of Current or Ex-Partner: Not on file  . Emotionally Abused: Not on file  . Physically Abused: Not on file  . Sexually Abused: Not on file     Constitutional: Pt reports fatigue. Denies fever, malaise, headache or abrupt weight changes.  HEENT: Denies eye pain, eye redness, ear pain, ringing in the ears, wax buildup, runny nose, nasal congestion, bloody nose, or sore throat. Respiratory: Pt reports intermittent shortness of breath (during humid weather)Denies difficulty breathing, shortness of breath, cough or sputum production.   Cardiovascular: Denies chest pain, chest tightness, palpitations or swelling in the hands or feet.  Gastrointestinal: Denies abdominal pain, bloating, constipation, diarrhea or blood in the stool.  GU: Denies urgency, frequency, pain with urination, burning sensation, blood in urine, odor or discharge. Musculoskeletal: Denies decrease in range of motion, difficulty with gait, muscle pain or joint pain and swelling.  Skin: Denies redness, rashes, lesions or ulcercations.  Neurological: Pt reports restless legs. Denies dizziness, difficulty with memory, difficulty with speech or problems with balance and coordination.  Psych: Pt has a history of anxiety and depression. Denies SI/HI.  No other specific complaints in a complete review of systems (except as listed in HPI above).     Objective:   Physical Exam   BP 126/84 (BP Location: Left Arm, Patient Position: Sitting, Cuff Size: Large)   Pulse 86   Temp 97.8 F (36.6 C) (Temporal)   Resp 16   Ht 5' 8.5" (1.74 m)   Wt 242 lb 1.9 oz (109.8 kg)   SpO2 97%   BMI 36.28 kg/m  Wt Readings from Last 3 Encounters:  09/01/20 242 lb 1.9 oz (109.8 kg)  05/19/20 242 lb (109.8 kg)  01/26/20 245 lb (111.1 kg)    General: Appears his stated  age, obese, in NAD. Skin: Warm, dry and intact. No rashes, lesions or ulcerations noted. HEENT: Head: normal shape and size; Eyes: sclera white, no icterus, conjunctiva pink, PERRLA and EOMs intact; Ears: Tm's gray and intact, normal light reflex;  Neck:  Neck supple, trachea midline. No masses, lumps or thyromegaly present.  Cardiovascular: Normal rate and rhythm. S1,S2 noted.  No murmur, rubs or gallops noted. No JVD or BLE edema.  Pulmonary/Chest: Normal effort and positive vesicular breath sounds. No respiratory distress. No wheezes, rales or ronchi noted.  Abdomen: Soft and nontender. Normal  bowel sounds. No distention or masses noted. Liver, spleen and kidneys non palpable. Musculoskeletal: Strength 5/5 BUE/BLE. No difficulty with gait.  Neurological: Alert and oriented. Cranial nerves II-XII grossly intact. Coordination normal.  Psychiatric: Mood and affect normal. Behavior is normal. Judgment and thought content normal.    BMET    Component Value Date/Time   NA 139 08/20/2019 1016   K 4.1 08/20/2019 1016   CL 103 08/20/2019 1016   CO2 29 08/20/2019 1016   GLUCOSE 102 (H) 08/20/2019 1016   BUN 12 08/20/2019 1016   CREATININE 0.91 08/20/2019 1016   CALCIUM 9.5 08/20/2019 1016   GFRNONAA 95.90 09/13/2010 1103    Lipid Panel     Component Value Date/Time   CHOL 165 08/20/2019 1016   TRIG 140.0 08/20/2019 1016   HDL 33.20 (L) 08/20/2019 1016   CHOLHDL 5 08/20/2019 1016   VLDL 28.0 08/20/2019 1016   LDLCALC 104 (H) 08/20/2019 1016    CBC    Component Value Date/Time   WBC 6.9 08/20/2019 1016   RBC 5.53 08/20/2019 1016   HGB 15.9 08/20/2019 1016   HCT 46.8 08/20/2019 1016   PLT 198.0 08/20/2019 1016   MCV 84.5 08/20/2019 1016   MCHC 34.1 08/20/2019 1016   RDW 13.0 08/20/2019 1016   LYMPHSABS 1.9 02/27/2016 1032   MONOABS 0.6 02/27/2016 1032   EOSABS 0.1 02/27/2016 1032   BASOSABS 0.0 02/27/2016 1032    Hgb A1C Lab Results  Component Value Date   HGBA1C 5.8  06/18/2019           Assessment & Plan:   Preventative Health Maintenance:  He declines flu shot Tetanus UTD Covid UTD Referral to GI for screening colonoscopy Encouraged him to consume a balanced diet and exercise regimen Advised him to see an eye doctor and dentist annually Will check CBC, CMET, Lipid, A1C today  Fatigue:  Will check TSH, Vit D and B12 today  Will follow up after labs, RTC in 6 months, follow up chronic conditions Webb Silversmith, NP This visit occurred during the SARS-CoV-2 public health emergency.  Safety protocols were in place, including screening questions prior to the visit, additional usage of staff PPE, and extensive cleaning of exam room while observing appropriate contact time as indicated for disinfecting solutions.

## 2020-09-01 NOTE — Assessment & Plan Note (Signed)
Will increase Ropinirole to 1 mg TID

## 2020-09-01 NOTE — Assessment & Plan Note (Signed)
Stable on Citalopram, wean not indicated Continue Xanax only for flying Support offered

## 2020-09-01 NOTE — Assessment & Plan Note (Signed)
A1C today Encouraged low carb diet and exercise for weight loss Will monitor

## 2020-09-01 NOTE — Assessment & Plan Note (Signed)
Controlled on Losartan and HCT Reinforced DASH diet and exercise for weight loss CMET today Will monitor

## 2020-09-01 NOTE — Patient Instructions (Signed)

## 2020-09-01 NOTE — Assessment & Plan Note (Signed)
Stable on Omeprazole CBC and CMET today Will monitor

## 2020-09-02 LAB — COMPREHENSIVE METABOLIC PANEL
ALT: 21 U/L (ref 0–53)
AST: 16 U/L (ref 0–37)
Albumin: 4.4 g/dL (ref 3.5–5.2)
Alkaline Phosphatase: 64 U/L (ref 39–117)
BUN: 14 mg/dL (ref 6–23)
CO2: 29 mEq/L (ref 19–32)
Calcium: 9.1 mg/dL (ref 8.4–10.5)
Chloride: 101 mEq/L (ref 96–112)
Creatinine, Ser: 0.92 mg/dL (ref 0.40–1.50)
GFR: 102 mL/min (ref >60.00)
Glucose, Bld: 85 mg/dL (ref 70–99)
Potassium: 4 mEq/L (ref 3.5–5.1)
Sodium: 138 mEq/L (ref 135–145)
Total Bilirubin: 0.7 mg/dL (ref 0.2–1.2)
Total Protein: 6.5 g/dL (ref 6.0–8.3)

## 2020-09-05 ENCOUNTER — Encounter: Payer: Self-pay | Admitting: Internal Medicine

## 2020-09-05 DIAGNOSIS — E559 Vitamin D deficiency, unspecified: Secondary | ICD-10-CM

## 2020-09-05 MED ORDER — VITAMIN D (ERGOCALCIFEROL) 1.25 MG (50000 UNIT) PO CAPS: 50000.0000 [IU] | ORAL_CAPSULE | ORAL | 0 refills | Status: DC

## 2020-09-20 DIAGNOSIS — D2261 Melanocytic nevi of right upper limb, including shoulder: Secondary | ICD-10-CM | POA: Diagnosis not present

## 2020-09-20 DIAGNOSIS — L918 Other hypertrophic disorders of the skin: Secondary | ICD-10-CM | POA: Diagnosis not present

## 2020-09-20 DIAGNOSIS — D225 Melanocytic nevi of trunk: Secondary | ICD-10-CM | POA: Diagnosis not present

## 2020-09-20 DIAGNOSIS — D2262 Melanocytic nevi of left upper limb, including shoulder: Secondary | ICD-10-CM | POA: Diagnosis not present

## 2020-09-22 ENCOUNTER — Telehealth: Payer: Self-pay

## 2020-09-22 ENCOUNTER — Telehealth: Payer: Federal, State, Local not specified - PPO

## 2020-09-22 NOTE — Telephone Encounter (Signed)
LVM with wife to complete patients triage for colonoscopy.  2nd attempt was made by calling home number.  No answer on either number.  Thanks,  [place], [place]

## 2020-11-07 ENCOUNTER — Other Ambulatory Visit: Payer: Self-pay | Admitting: Internal Medicine

## 2020-11-28 ENCOUNTER — Other Ambulatory Visit: Payer: Self-pay | Admitting: Internal Medicine

## 2021-01-30 ENCOUNTER — Other Ambulatory Visit: Payer: Self-pay | Admitting: Internal Medicine

## 2021-01-31 ENCOUNTER — Encounter: Payer: Self-pay | Admitting: Internal Medicine

## 2021-01-31 ENCOUNTER — Other Ambulatory Visit: Payer: Self-pay

## 2021-01-31 ENCOUNTER — Ambulatory Visit: Payer: Federal, State, Local not specified - PPO | Admitting: Internal Medicine

## 2021-01-31 VITALS — BP 124/82 | HR 79 | Temp 97.3°F | Wt 245.0 lb

## 2021-01-31 DIAGNOSIS — F32A Depression, unspecified: Secondary | ICD-10-CM | POA: Diagnosis not present

## 2021-01-31 DIAGNOSIS — E781 Pure hyperglyceridemia: Secondary | ICD-10-CM

## 2021-01-31 DIAGNOSIS — R7303 Prediabetes: Secondary | ICD-10-CM | POA: Diagnosis not present

## 2021-01-31 DIAGNOSIS — F419 Anxiety disorder, unspecified: Secondary | ICD-10-CM | POA: Diagnosis not present

## 2021-01-31 DIAGNOSIS — K219 Gastro-esophageal reflux disease without esophagitis: Secondary | ICD-10-CM

## 2021-01-31 DIAGNOSIS — E559 Vitamin D deficiency, unspecified: Secondary | ICD-10-CM

## 2021-01-31 DIAGNOSIS — G2581 Restless legs syndrome: Secondary | ICD-10-CM

## 2021-01-31 DIAGNOSIS — I10 Essential (primary) hypertension: Secondary | ICD-10-CM | POA: Diagnosis not present

## 2021-01-31 LAB — COMPREHENSIVE METABOLIC PANEL
ALT: 24 U/L (ref 0–53)
AST: 17 U/L (ref 0–37)
Albumin: 4.7 g/dL (ref 3.5–5.2)
Alkaline Phosphatase: 74 U/L (ref 39–117)
BUN: 15 mg/dL (ref 6–23)
CO2: 29 mEq/L (ref 19–32)
Calcium: 9.6 mg/dL (ref 8.4–10.5)
Chloride: 101 mEq/L (ref 96–112)
Creatinine, Ser: 0.9 mg/dL (ref 0.40–1.50)
GFR: 100.04 mL/min (ref >60.00)
Glucose, Bld: 69 mg/dL — ABNORMAL LOW (ref 70–99)
Potassium: 4 mEq/L (ref 3.5–5.1)
Sodium: 138 mEq/L (ref 135–145)
Total Bilirubin: 0.6 mg/dL (ref 0.2–1.2)
Total Protein: 7 g/dL (ref 6.0–8.3)

## 2021-01-31 LAB — LIPID PANEL
Cholesterol: 182 mg/dL (ref 0–200)
HDL: 36.4 mg/dL — ABNORMAL LOW (ref >39.00)
NonHDL: 145.96
Total CHOL/HDL Ratio: 5
Triglycerides: 257 mg/dL — ABNORMAL HIGH (ref 0.0–149.0)
VLDL: 51.4 mg/dL — ABNORMAL HIGH (ref 0.0–40.0)

## 2021-01-31 LAB — CBC
HCT: 47 % (ref 39.0–52.0)
Hemoglobin: 16.1 g/dL (ref 13.0–17.0)
MCHC: 34.2 g/dL (ref 30.0–36.0)
MCV: 83.2 fl (ref 78.0–100.0)
Platelets: 201 10*3/uL (ref 150.0–400.0)
RBC: 5.65 Mil/uL (ref 4.22–5.81)
RDW: 13.2 % (ref 11.5–15.5)
WBC: 7.2 10*3/uL (ref 4.0–10.5)

## 2021-01-31 LAB — LDL CHOLESTEROL, DIRECT: Direct LDL: 126 mg/dL

## 2021-01-31 LAB — HEMOGLOBIN A1C: Hgb A1c MFr Bld: 5.9 % (ref 4.6–6.5)

## 2021-01-31 LAB — VITAMIN D 25 HYDROXY (VIT D DEFICIENCY, FRACTURES): VITD: 23.84 ng/mL — ABNORMAL LOW (ref 30.00–100.00)

## 2021-01-31 MED ORDER — ALPRAZOLAM 0.25 MG PO TABS
0.2500 mg | ORAL_TABLET | Freq: Every day | ORAL | 0 refills | Status: DC | PRN
Start: 2021-01-31 — End: 2022-07-18

## 2021-01-31 MED ORDER — VITAMIN D (ERGOCALCIFEROL) 1.25 MG (50000 UNIT) PO CAPS: 50000.0000 [IU] | ORAL_CAPSULE | ORAL | 0 refills | Status: DC

## 2021-01-31 NOTE — Assessment & Plan Note (Signed)
Cmet, lipid and A1c perfect Encouraged low-carb, low saturated fat diet and exercise weight loss We will monitor

## 2021-01-31 NOTE — Assessment & Plan Note (Signed)
Controlled with Ropinirole We will monitor

## 2021-01-31 NOTE — Assessment & Plan Note (Signed)
Controlled on Omeprazole CBC and c-Met today Weight loss can help reduce reflux symptoms

## 2021-01-31 NOTE — Progress Notes (Signed)
Subjective:    Patient ID: Jonathan [name], male    DOB: [birth year], 50 y.o.   MRN: [id number]  HPI  Patient presents the clinic today for 58-month follow-up of chronic conditions.  Anxiety and Depression: Chronic, managed on Citalopram.  He takes Xanax as needed for flying and would like a refill of this today.  He is not currently seeing a therapist.  He denies SI/HI.  HTN: His BP today is 124/82.  He is taking Losartan and HCTZ as prescribed.  ECG from 05/2012 reviewed.  GERD: He denies breakthrough on Omeprazole.  There is no upper GI on file.  RLS: Persistent, managed on Ropinirole.  He is not following with neurology.  Prediabetes: His last A1c was 5/9%, 08/2020.  He does not check his sugars.  He is not taking any oral diabetic medication at this time.  Vit D Deficiency: He has completed 3 weeks of Ergocalicferol. He is due for repeat Vit D today. Review of Systems      Past Medical History:  Diagnosis Date  . Anxiety   . Hypertension     Current Outpatient Medications  Medication Sig Dispense Refill  . albuterol (VENTOLIN HFA) 108 (90 Base) MCG/ACT inhaler Inhale 2 puffs into the lungs every 6 (six) hours as needed. 8 g 1  . ALPRAZolam (XANAX) 0.25 MG tablet Take 1 tablet (0.25 mg total) by mouth daily as needed for anxiety. (Patient not taking: Reported on 09/01/2020) 10 tablet 0  . citalopram (CELEXA) 40 MG tablet TAKE 1 TABLET BY MOUTH EVERY DAY 90 tablet 0  . hydrochlorothiazide (MICROZIDE) 12.5 MG capsule TAKE 1 CAPSULE BY MOUTH EVERY DAY 90 capsule 0  . losartan (COZAAR) 100 MG tablet TAKE 1/2 TABLET (50 MG TOTAL) BY MOUTH DAILY 45 tablet 0  . Multiple Vitamin (MULTIVITAMIN) tablet Take 1 tablet by mouth daily.    Marland Kitchen omeprazole (PRILOSEC) 40 MG capsule TAKE 1 CAPSULE BY MOUTH EVERY DAY 90 capsule 0  . rOPINIRole (REQUIP) 1 MG tablet TAKE 1 TABLET BY MOUTH 3 TIMES DAILY. 270 tablet 0  . Vitamin D, Ergocalciferol, (DRISDOL) 1.25 MG (50000 UNIT) CAPS capsule Take  1 capsule (50,000 Units total) by mouth every 7 (seven) days. 12 capsule 0   No current facility-administered medications for this visit.    Allergies  Allergen Reactions  . Ace Inhibitors     Cough     Family History  Problem Relation Age of Onset  . Cancer Mother        breast  . Cancer Father        prostate    Social History   Socioeconomic History  . Marital status: Married    Spouse name: Not on file  . Number of children: 0  . Years of education: Not on file  . Highest education level: Not on file  Occupational History  . Not on file  Tobacco Use  . Smoking status: Former Smoker    Quit date: 11/12/1993    Years since quitting: 27.2  . Smokeless tobacco: Current User    Types: Chew  Substance and Sexual Activity  . Alcohol use: No    Alcohol/week: 0.0 standard drinks  . Drug use: Not on file  . Sexual activity: Not on file  Other Topics Concern  . Not on file  Social History Narrative  . Not on file   Social Determinants of Health   Financial Resource Strain: Not on file  Food Insecurity: Not on file  Transportation Needs: Not on file  Physical Activity: Not on file  Stress: Not on file  Social Connections: Not on file  Intimate Partner Violence: Not on file     Constitutional: Pt reports weight gain. Denies fever, malaise, fatigue, headache.  Respiratory: Denies difficulty breathing, shortness of breath, cough or sputum production.   Cardiovascular: Denies chest pain, chest tightness, palpitations or swelling in the hands or feet.  Gastrointestinal: Denies abdominal pain, bloating, constipation, diarrhea or blood in the stool.  Skin: Denies redness, rashes, lesions or ulcercations.  Neurological: Patient reports restless legs.  Denies dizziness, difficulty with memory, difficulty with speech or problems with balance and coordination.  Psych: Patient has a history of anxiety and depression.  Denies SI/HI.  No other specific complaints in a  complete review of systems (except as listed in HPI above).  Objective:   Physical Exam  BP 124/82   Pulse 79   Temp (!) 97.3 F (36.3 C) (Temporal)   Wt 245 lb (111.1 kg)   SpO2 98%   BMI 36.71 kg/m   Wt Readings from Last 3 Encounters:  09/01/20 242 lb 1.9 oz (109.8 kg)  05/19/20 242 lb (109.8 kg)  01/26/20 245 lb (111.1 kg)    General: Appears his stated age, obese, in NAD. Skin: Warm, dry and intact. No rashes noted. HEENT: Head: normal shape and size; Eyes: sclera white, no icterus, conjunctiva pink, PERRLA and EOMs intact;  Cardiovascular: Normal rate and rhythm. S1,S2 noted.  No murmur, rubs or gallops noted. No JVD or BLE edema.  Pulmonary/Chest: Normal effort and positive vesicular breath sounds. No respiratory distress. No wheezes, rales or ronchi noted.  Musculoskeletal:  No difficulty with gait.  Neurological: Alert and oriented.  Psychiatric: Mood and affect normal. Behavior is normal. Judgment and thought content normal.    BMET    Component Value Date/Time   NA 138 09/01/2020 1114   K 4.0 09/01/2020 1114   CL 101 09/01/2020 1114   CO2 29 09/01/2020 1114   GLUCOSE 85 09/01/2020 1114   BUN 14 09/01/2020 1114   CREATININE 0.92 09/01/2020 1114   CALCIUM 9.1 09/01/2020 1114   GFRNONAA 95.90 09/13/2010 1103    Lipid Panel     Component Value Date/Time   CHOL 170 09/01/2020 1114   TRIG 189.0 (H) 09/01/2020 1114   HDL 33.90 (L) 09/01/2020 1114   CHOLHDL 5 09/01/2020 1114   VLDL 37.8 09/01/2020 1114   LDLCALC 98 09/01/2020 1114    CBC    Component Value Date/Time   WBC 7.6 09/01/2020 1114   RBC 5.40 09/01/2020 1114   HGB 15.5 09/01/2020 1114   HCT 45.1 09/01/2020 1114   PLT 192.0 09/01/2020 1114   MCV 83.6 09/01/2020 1114   MCHC 34.3 09/01/2020 1114   RDW 13.0 09/01/2020 1114   LYMPHSABS 1.9 02/27/2016 1032   MONOABS 0.6 02/27/2016 1032   EOSABS 0.1 02/27/2016 1032   BASOSABS 0.0 02/27/2016 1032    Hgb A1C Lab Results  Component Value  Date   HGBA1C 5.9 09/01/2020           Assessment & Plan:   Vit D Deficiency:  Vit D today  Will follow up after labs, return precautions discussed Webb Silversmith, NP  This visit occurred during the SARS-CoV-2 public health emergency.  Safety protocols were in place, including screening questions prior to the visit, additional usage of staff PPE, and extensive cleaning of exam room while observing appropriate contact time as indicated for disinfecting  solutions.

## 2021-01-31 NOTE — Addendum Note (Signed)
Addended by: Jearld Fenton on: 01/31/2021 07:32 PM   Modules accepted: Orders

## 2021-01-31 NOTE — Patient Instructions (Signed)

## 2021-01-31 NOTE — Assessment & Plan Note (Signed)
Stable on Citalopram Xanax refilled today Will monitor

## 2021-01-31 NOTE — Assessment & Plan Note (Signed)
Controlled on Losartan and HCTZ C-Met today Reinforced DASH diet and exercise weight loss We will monitor

## 2021-02-01 ENCOUNTER — Encounter: Payer: Self-pay | Admitting: Internal Medicine

## 2021-02-01 ENCOUNTER — Other Ambulatory Visit: Payer: Self-pay | Admitting: Internal Medicine

## 2021-02-01 DIAGNOSIS — E781 Pure hyperglyceridemia: Secondary | ICD-10-CM

## 2021-02-01 DIAGNOSIS — E559 Vitamin D deficiency, unspecified: Secondary | ICD-10-CM

## 2021-02-28 ENCOUNTER — Other Ambulatory Visit: Payer: Self-pay | Admitting: Internal Medicine

## 2021-04-22 ENCOUNTER — Other Ambulatory Visit: Payer: Self-pay | Admitting: Internal Medicine

## 2021-04-22 NOTE — Telephone Encounter (Signed)
Routing back to [facility]

## 2021-04-26 NOTE — Telephone Encounter (Signed)
Refill request Vitamin D Last refill 01/31/21 #12 Last office visit 01/31/21

## 2021-04-30 ENCOUNTER — Other Ambulatory Visit: Payer: Self-pay | Admitting: Internal Medicine

## 2021-05-02 NOTE — Telephone Encounter (Signed)
Last office visit 01/31/2021 with R. Baity.  Last refilled HCTZ 02/02/2021 for #90 with no refills.  Losartan: 01/31/2021 for #45 with no refills.  No TOC appointment.

## 2021-05-03 ENCOUNTER — Other Ambulatory Visit: Payer: Self-pay | Admitting: Internal Medicine

## 2021-05-24 ENCOUNTER — Ambulatory Visit
Admission: RE | Admit: 2021-05-24 | Discharge: 2021-05-24 | Disposition: A | Discharge status: Home / Self Care | Payer: Federal, State, Local not specified - PPO | Source: Ambulatory Visit | Attending: Internal Medicine | Admitting: Internal Medicine

## 2021-05-24 ENCOUNTER — Other Ambulatory Visit: Payer: Self-pay

## 2021-05-24 ENCOUNTER — Ambulatory Visit
Admission: RE | Admit: 2021-05-24 | Discharge: 2021-05-24 | Disposition: A | Discharge status: Home / Self Care | Payer: Federal, State, Local not specified - PPO | Attending: Internal Medicine | Admitting: Internal Medicine

## 2021-05-24 ENCOUNTER — Ambulatory Visit: Payer: Federal, State, Local not specified - PPO | Admitting: Internal Medicine

## 2021-05-24 ENCOUNTER — Encounter: Payer: Self-pay | Admitting: Internal Medicine

## 2021-05-24 VITALS — BP 115/84 | HR 84 | Temp 97.3°F | Resp 17 | Ht 68.5 in | Wt 249.6 lb

## 2021-05-24 DIAGNOSIS — Z6836 Body mass index (BMI) 36.0-36.9, adult: Secondary | ICD-10-CM | POA: Insufficient documentation

## 2021-05-24 DIAGNOSIS — M79604 Pain in right leg: Secondary | ICD-10-CM

## 2021-05-24 DIAGNOSIS — E6609 Other obesity due to excess calories: Secondary | ICD-10-CM | POA: Insufficient documentation

## 2021-05-24 DIAGNOSIS — G2581 Restless legs syndrome: Secondary | ICD-10-CM | POA: Diagnosis not present

## 2021-05-24 DIAGNOSIS — M79671 Pain in right foot: Secondary | ICD-10-CM

## 2021-05-24 DIAGNOSIS — Z6837 Body mass index (BMI) 37.0-37.9, adult: Secondary | ICD-10-CM

## 2021-05-24 DIAGNOSIS — M19071 Primary osteoarthritis, right ankle and foot: Secondary | ICD-10-CM | POA: Diagnosis not present

## 2021-05-24 DIAGNOSIS — Z6841 Body Mass Index (BMI) 40.0 and over, adult: Secondary | ICD-10-CM | POA: Insufficient documentation

## 2021-05-24 DIAGNOSIS — E66812 Obesity, class 2: Secondary | ICD-10-CM | POA: Insufficient documentation

## 2021-05-24 DIAGNOSIS — M79661 Pain in right lower leg: Secondary | ICD-10-CM | POA: Diagnosis not present

## 2021-05-24 MED ORDER — ROPINIROLE HCL 2 MG PO TABS: 2.0000 mg | ORAL_TABLET | Freq: Three times a day (TID) | ORAL | 5 refills | Status: DC

## 2021-05-24 NOTE — Assessment & Plan Note (Signed)
Advised him 6 mg is above the recommend dose of 4 mg/day RX sent to pharmacy

## 2021-05-24 NOTE — Progress Notes (Signed)
Subjective:    Patient ID: Jonathan [name], male    DOB: 29-Sep-1971, 50 y.o.   MRN: [id number]  HPI  Patient presents the clinic today with c/o right lower leg and right foot pain. He reports this started 4 months ago. He describes the pain as throbbing. It is constant. It seems to radiate up his leg. He denies low back, right hip, knee or ankle pain. He has not noticed any swelling. He denies any injury to the area. He denies numbness or tingling but does feel some weakness in the RLE. He has not tried anything OTC for his symptoms.  He also needs a refill of his Requip. He is taking 2 mg 3 x day for RLS.    Review of Systems     Past Medical History:  Diagnosis Date   Anxiety    Hypertension     Current Outpatient Medications  Medication Sig Dispense Refill   albuterol (VENTOLIN HFA) 108 (90 Base) MCG/ACT inhaler Inhale 2 puffs into the lungs every 6 (six) hours as needed. 8 g 1   ALPRAZolam (XANAX) 0.25 MG tablet Take 1 tablet (0.25 mg total) by mouth daily as needed for anxiety. 10 tablet 0   citalopram (CELEXA) 40 MG tablet TAKE 1 TABLET BY MOUTH EVERY DAY 90 tablet 0   hydrochlorothiazide (MICROZIDE) 12.5 MG capsule TAKE 1 CAPSULE BY MOUTH EVERY DAY 90 capsule 0   losartan (COZAAR) 100 MG tablet TAKE 1/2 TABLET (50 MG TOTAL) BY MOUTH DAILY 45 tablet 0   Multiple Vitamin (MULTIVITAMIN) tablet Take 1 tablet by mouth daily.     omeprazole (PRILOSEC) 40 MG capsule TAKE 1 CAPSULE BY MOUTH EVERY DAY 90 capsule 0   rOPINIRole (REQUIP) 1 MG tablet TAKE 1 TABLET BY MOUTH THREE TIMES A DAY 270 tablet 0   Vitamin D, Ergocalciferol, (DRISDOL) 1.25 MG (50000 UNIT) CAPS capsule TAKE 1 CAPSULE (50,000 UNITS TOTAL) BY MOUTH EVERY 7 (SEVEN) DAYS 12 capsule 0   No current facility-administered medications for this visit.    Allergies  Allergen Reactions   Ace Inhibitors     Cough     Family History  Problem Relation Age of Onset   Cancer Mother        breast   Cancer Father         prostate    Social History   Socioeconomic History   Marital status: Married    Spouse name: Not on file   Number of children: 0   Years of education: Not on file   Highest education level: Not on file  Occupational History   Not on file  Tobacco Use   Smoking status: Former    Pack years: 0.00   Smokeless tobacco: Current    Types: Chew  Substance and Sexual Activity   Alcohol use: No    Alcohol/week: 0.0 standard drinks   Drug use: Not on file   Sexual activity: Not on file  Other Topics Concern   Not on file  Social History Narrative   Not on file   Social Determinants of Health   Financial Resource Strain: Not on file  Food Insecurity: Not on file  Transportation Needs: Not on file  Physical Activity: Not on file  Stress: Not on file  Social Connections: Not on file  Intimate Partner Violence: Not on file     Constitutional: Denies fever, malaise, fatigue, headache or abrupt weight changes.  Respiratory: Denies difficulty breathing, shortness of breath, cough or  sputum production.   Cardiovascular: Denies chest pain, chest tightness, palpitations or swelling in the hands or feet.  Musculoskeletal: Pt reports right lower leg, right foot pain, RLE weakness. Denies decrease in range of motion, difficulty with gait, or joint pain and swelling.  Skin: Denies redness, rashes, lesions or ulcercations.  Neurological: Patient reports restless legs.  Denies dizziness, difficulty with memory, difficulty with speech or problems with balance and coordination.    No other specific complaints in a complete review of systems (except as listed in HPI above).  Objective:   Physical Exam  BP 115/84 (BP Location: Left Arm, Patient Position: Sitting, Cuff Size: Large)   Pulse 84   Temp (!) 97.3 F (36.3 C) (Temporal)   Resp 17   Ht 5' 8.5" (1.74 m)   Wt 249 lb 9.6 oz (113.2 kg)   SpO2 98%   BMI 37.40 kg/m   Wt Readings from Last 3 Encounters:  01/31/21 245 lb  (111.1 kg)  09/01/20 242 lb 1.9 oz (109.8 kg)  05/19/20 242 lb (109.8 kg)    General: Appears his stated age, obese, in NAD. Skin: Warm, dry and intact. No rashes, lesions or ulcerations noted. HEENT: Head: normal shape and size; Eyes: sclera white and EOMs intact;  Cardiovascular: Normal rate and rhythm. S1,S2 noted.  No murmur, rubs or gallops noted. No JVD or BLE edema. Pedal pulse 2+ on the right.  Pulmonary/Chest: Normal effort and positive vesicular breath sounds. No respiratory distress. No wheezes, rales or ronchi noted.  Musculoskeletal: Normal flexion, extension and rotation of the spine. No bony tenderness noted over the spine. Normal flexion and extension of the right knee. Normal flexion, extension and rotation of hte right ankle. No signs of joint swelling. Pain with palpation over the distal media tibia. Strength 5/5 BLE.No difficulty with gait.  Neurological: Alert and oriented.  Psych: Mood and affect normal.     BMET    Component Value Date/Time   NA 138 01/31/2021 1035   K 4.0 01/31/2021 1035   CL 101 01/31/2021 1035   CO2 29 01/31/2021 1035   GLUCOSE 69 (L) 01/31/2021 1035   BUN 15 01/31/2021 1035   CREATININE 0.90 01/31/2021 1035   CALCIUM 9.6 01/31/2021 1035   GFRNONAA 95.90 09/13/2010 1103    Lipid Panel     Component Value Date/Time   CHOL 182 01/31/2021 1035   TRIG 257.0 (H) 01/31/2021 1035   HDL 36.40 (L) 01/31/2021 1035   CHOLHDL 5 01/31/2021 1035   VLDL 51.4 (H) 01/31/2021 1035   LDLCALC 98 09/01/2020 1114    CBC    Component Value Date/Time   WBC 7.2 01/31/2021 1035   RBC 5.65 01/31/2021 1035   HGB 16.1 01/31/2021 1035   HCT 47.0 01/31/2021 1035   PLT 201.0 01/31/2021 1035   MCV 83.2 01/31/2021 1035   MCHC 34.2 01/31/2021 1035   RDW 13.2 01/31/2021 1035   LYMPHSABS 1.9 02/27/2016 1032   MONOABS 0.6 02/27/2016 1032   EOSABS 0.1 02/27/2016 1032   BASOSABS 0.0 02/27/2016 1032   Patient presents the clinic today for follow-up of  chronic conditions. Hgb A1C Lab Results  Component Value Date   HGBA1C 5.9 01/31/2021            Assessment & Plan:   Chronic Right Lower Leg Pain, Chronic Right Foot Pain:  Xray tib/fib today Xray right foot today Encouraged rest, ice and elevation Work note provided Consider PT vs referral to podiatry for further evaluation of  symptoms  Return precautions discussed Webb Silversmith, NP This visit occurred during the SARS-CoV-2 public health emergency.  Safety protocols were in place, including screening questions prior to the visit, additional usage of staff PPE, and extensive cleaning of exam room while observing appropriate contact time as indicated for disinfecting solutions.

## 2021-05-24 NOTE — Assessment & Plan Note (Signed)
Encouraged diet and exercise for weight loss ?

## 2021-05-24 NOTE — Patient Instructions (Signed)
RICE Therapy for Routine Care of Injuries Many injuries can be cared for with rest, ice, compression, and elevation (RICE therapy). This includes: Resting the injured body part. Putting ice on the injury. Putting pressure (compression) on the injury. Raising the injured part (elevation). Using RICE therapy can help to lessen pain and swelling. Supplies needed: Ice. Plastic bag. Towel. Elastic bandage. Pillow or pillows to raise your injured body part. How to care for your injury with RICE therapy Rest Try to rest the injured part of your body. You can go back to your normal activities when your doctor says it is okay to do them and when you can do themwithout pain. If you rest the injury too much, it may not heal as well. Some injuries heal better with early movement instead of resting for too long. Ask your doctor ifyou should do exercises to help your injury get better. Ice  If told, put ice on the injured area. To do this: Put ice in a plastic bag. Place a towel between your skin and the bag. Leave the ice on for 20 minutes, 2-3 times a day. Take off the ice if your skin turns bright red. This is very important. If you cannot feel pain, heat, or cold, you have a greater risk of damage to the area. Do not put ice on your bare skin. Use ice for as many days as your doctor tells you to use it.  Compression Put pressure on the injured area. This can be done with an elastic bandage. If this type of bandage has been put on your injury: Follow instructions on the package the bandage came in about how to use it. Do not wrap the bandage too tightly. Wrap the bandage more loosely if part of your body beyond the bandage is blue, swollen, cold, painful, or loses feeling. Take off the bandage and put it on again every 3-4 hours or as told by your doctor. See your doctor if the bandage seems to make your problems worse.  Elevation Raise the injured area above the level of your heart while you  are sitting orlying down. Follow these instructions at home: If your symptoms get worse or last a long time, make a follow-up appointment with your doctor. You may need to have imaging tests, such as X-rays or an MRI. If you have imaging tests, ask how to get your results when they are ready. Return to your normal activities when your doctor says that it is safe. Keep all follow-up visits. Contact a doctor if: You keep having pain and swelling. Your symptoms get worse. Get help right away if: You have sudden, very bad pain at your injury or lower than your injury. You have redness or more swelling around your injury. You have tingling or numbness at your injury or lower than your injury, and it does not go away when you take off the bandage. Summary Many injuries can be cared for using rest, ice, compression, and elevation (RICE therapy). You can go back to your normal activities when your doctor says it is okay and when you can do them without pain. Put ice on the injured area as told by your doctor. Get help if your symptoms get worse or if you keep having pain and swelling. This information is not intended to replace advice given to you by your health care provider. Make sure you discuss any questions you have with your healthcare provider. Document Revised: 08/18/2020 Document Reviewed: 08/18/2020 Elsevier Patient Education    2022 Elsevier Inc.  

## 2021-06-11 ENCOUNTER — Other Ambulatory Visit: Payer: Self-pay | Admitting: Internal Medicine

## 2021-06-12 NOTE — Telephone Encounter (Signed)
Requested Prescriptions  Pending Prescriptions Disp Refills  . citalopram (CELEXA) 40 MG tablet [Pharmacy Med Name: CITALOPRAM HBR 40 MG TABLET] 90 tablet 0    Sig: TAKE 1 TABLET BY MOUTH EVERY DAY     Psychiatry:  Antidepressants - SSRI Passed - 06/12/2021  9:38 AM      Passed - Completed PHQ-2 or PHQ-9 in the last 360 days      Passed - Valid encounter within last 6 months    Recent Outpatient Visits          2 weeks ago Right leg pain   [facility] [place], [place] W, NP             . omeprazole (PRILOSEC) 40 MG capsule [Pharmacy Med Name: OMEPRAZOLE DR 40 MG CAPSULE] 90 capsule 0    Sig: TAKE 1 CAPSULE BY MOUTH EVERY DAY     Gastroenterology: Proton Pump Inhibitors Passed - 06/12/2021  9:38 AM      Passed - Valid encounter within last 12 months    Recent Outpatient Visits          2 weeks ago Right leg pain   [facility] [place], [place], [place]

## 2021-07-18 ENCOUNTER — Telehealth: Payer: Self-pay

## 2021-07-18 NOTE — Telephone Encounter (Signed)
Copied from [place] [phone]. Topic: General - Other >> Jul 18, 2021  3:14 PM Bayard Beaver wrote: Reason for CRM: patient called in about getting a note for work because he had a stomach virus. Please call back

## 2021-07-19 NOTE — Telephone Encounter (Signed)
I can not give him a note, when I did not see him for this

## 2021-08-28 ENCOUNTER — Other Ambulatory Visit: Payer: Self-pay | Admitting: Internal Medicine

## 2021-08-29 NOTE — Telephone Encounter (Signed)
Requested Prescriptions  Pending Prescriptions Disp Refills  . omeprazole (PRILOSEC) 40 MG capsule [Pharmacy Med Name: OMEPRAZOLE DR 40 MG CAPSULE] 90 capsule 1    Sig: TAKE 1 CAPSULE BY MOUTH EVERY DAY     Gastroenterology: Proton Pump Inhibitors Passed - 08/28/2021  7:09 PM      Passed - Valid encounter within last 12 months    Recent Outpatient Visits          3 months ago Right leg pain   [facility] [place], [place], [place]

## 2021-08-29 NOTE — Telephone Encounter (Signed)
Requested Prescriptions  Pending Prescriptions Disp Refills  . citalopram (CELEXA) 40 MG tablet [Pharmacy Med Name: CITALOPRAM HBR 40 MG TABLET] 30 tablet 0    Sig: TAKE 1 TABLET BY MOUTH EVERY DAY     Psychiatry:  Antidepressants - SSRI Failed - 08/28/2021  7:09 PM      Failed - Completed PHQ-2 or PHQ-9 in the last 360 days      Passed - Valid encounter within last 6 months    Recent Outpatient Visits          3 months ago Right leg pain   [facility] [place], [place], [place]

## 2021-09-25 DIAGNOSIS — D225 Melanocytic nevi of trunk: Secondary | ICD-10-CM | POA: Diagnosis not present

## 2021-09-25 DIAGNOSIS — D2262 Melanocytic nevi of left upper limb, including shoulder: Secondary | ICD-10-CM | POA: Diagnosis not present

## 2021-09-25 DIAGNOSIS — D2261 Melanocytic nevi of right upper limb, including shoulder: Secondary | ICD-10-CM | POA: Diagnosis not present

## 2021-09-25 DIAGNOSIS — L821 Other seborrheic keratosis: Secondary | ICD-10-CM | POA: Diagnosis not present

## 2021-09-26 ENCOUNTER — Other Ambulatory Visit: Payer: Self-pay | Admitting: Internal Medicine

## 2021-09-26 NOTE — Telephone Encounter (Signed)
Requested medication (s) are due for refill today: yes  Requested medication (s) are on the active medication list: yes   Last refill: 08/29/21  #30  0 refills  Future visit scheduled no  Notes to clinic: Needs appt. Left VM to call back to secure appt.  Requested Prescriptions  Pending Prescriptions Disp Refills   citalopram (CELEXA) 40 MG tablet [Pharmacy Med Name: CITALOPRAM HBR 40 MG TABLET] 90 tablet 1    Sig: TAKE 1 TABLET BY MOUTH EVERY DAY     Psychiatry:  Antidepressants - SSRI Failed - 09/26/2021 11:32 AM      Failed - Completed PHQ-2 or PHQ-9 in the last 360 days      Passed - Valid encounter within last 6 months    Recent Outpatient Visits           4 months ago Right leg pain   [facility] [place], [place], [place]

## 2021-09-27 DIAGNOSIS — R1011 Right upper quadrant pain: Secondary | ICD-10-CM | POA: Diagnosis not present

## 2021-09-27 DIAGNOSIS — F419 Anxiety disorder, unspecified: Secondary | ICD-10-CM | POA: Diagnosis not present

## 2021-09-27 DIAGNOSIS — R918 Other nonspecific abnormal finding of lung field: Secondary | ICD-10-CM | POA: Diagnosis not present

## 2021-09-27 DIAGNOSIS — I10 Essential (primary) hypertension: Secondary | ICD-10-CM | POA: Diagnosis not present

## 2021-09-27 DIAGNOSIS — R0781 Pleurodynia: Secondary | ICD-10-CM | POA: Diagnosis not present

## 2021-09-27 DIAGNOSIS — R059 Cough, unspecified: Secondary | ICD-10-CM | POA: Diagnosis not present

## 2021-10-08 ENCOUNTER — Other Ambulatory Visit: Payer: Self-pay | Admitting: Internal Medicine

## 2021-10-29 ENCOUNTER — Other Ambulatory Visit: Payer: Self-pay | Admitting: Family

## 2021-10-30 ENCOUNTER — Other Ambulatory Visit: Payer: Self-pay | Admitting: Family

## 2021-11-03 ENCOUNTER — Other Ambulatory Visit: Payer: Self-pay | Admitting: Internal Medicine

## 2021-11-03 NOTE — Telephone Encounter (Signed)
Requested Prescriptions  Pending Prescriptions Disp Refills   citalopram (CELEXA) 40 MG tablet [Pharmacy Med Name: CITALOPRAM HBR 40 MG TABLET] 90 tablet 1    Sig: TAKE 1 TABLET BY MOUTH EVERY DAY     Psychiatry:  Antidepressants - SSRI Failed - 11/03/2021  2:33 PM      Failed - Completed PHQ-2 or PHQ-9 in the last 360 days      Passed - Valid encounter within last 6 months    Recent Outpatient Visits          5 months ago Right leg pain   [facility] [place], [place], [place]

## 2021-11-21 ENCOUNTER — Encounter: Payer: Self-pay | Admitting: Internal Medicine

## 2021-11-21 ENCOUNTER — Ambulatory Visit: Payer: Federal, State, Local not specified - PPO | Admitting: Internal Medicine

## 2021-11-21 ENCOUNTER — Other Ambulatory Visit: Payer: Self-pay

## 2021-11-21 VITALS — BP 119/86 | HR 102 | Temp 98.0°F | Ht 68.5 in | Wt 249.0 lb

## 2021-11-21 DIAGNOSIS — E782 Mixed hyperlipidemia: Secondary | ICD-10-CM

## 2021-11-21 DIAGNOSIS — R7303 Prediabetes: Secondary | ICD-10-CM

## 2021-11-21 DIAGNOSIS — F32A Depression, unspecified: Secondary | ICD-10-CM

## 2021-11-21 DIAGNOSIS — Z6837 Body mass index (BMI) 37.0-37.9, adult: Secondary | ICD-10-CM

## 2021-11-21 DIAGNOSIS — F419 Anxiety disorder, unspecified: Secondary | ICD-10-CM | POA: Diagnosis not present

## 2021-11-21 DIAGNOSIS — I10 Essential (primary) hypertension: Secondary | ICD-10-CM

## 2021-11-21 DIAGNOSIS — G2581 Restless legs syndrome: Secondary | ICD-10-CM

## 2021-11-21 DIAGNOSIS — Z1211 Encounter for screening for malignant neoplasm of colon: Secondary | ICD-10-CM | POA: Diagnosis not present

## 2021-11-21 DIAGNOSIS — I700 Atherosclerosis of aorta: Secondary | ICD-10-CM | POA: Diagnosis not present

## 2021-11-21 DIAGNOSIS — K219 Gastro-esophageal reflux disease without esophagitis: Secondary | ICD-10-CM

## 2021-11-21 MED ORDER — ALBUTEROL SULFATE HFA 108 (90 BASE) MCG/ACT IN AERS: 2.0000 | INHALATION_SPRAY | Freq: Four times a day (QID) | RESPIRATORY_TRACT | 1 refills | Status: DC | PRN

## 2021-11-21 NOTE — Assessment & Plan Note (Signed)
We will check A1c in 2 months at his annual exam Encourage low-carb diet and exercise for weight loss

## 2021-11-21 NOTE — Assessment & Plan Note (Signed)
Continue Citalopram and Xanax Support offered

## 2021-11-21 NOTE — Assessment & Plan Note (Signed)
We will check c-Met and lipid profile with annual exam Encourage low-fat diet and exercise for weight loss Consider statin therapy if LDL greater than 100

## 2021-11-21 NOTE — Assessment & Plan Note (Signed)
Encourage weight loss as this can help reduce reflux symptoms Continue Omeprazole

## 2021-11-21 NOTE — Patient Instructions (Signed)
Heart-Healthy Eating Plan Heart-healthy meal planning includes: Eating less unhealthy fats. Eating more healthy fats. Making other changes in your diet. Talk with your doctor or a diet specialist (dietitian) to create an eating plan that is right for you. What is my plan? Your doctor may recommend an eating plan that includes: Total fat: ______% or less of total calories a day. Saturated fat: ______% or less of total calories a day. Cholesterol: less than _________mg a day. What are tips for following this plan? Cooking Avoid frying your food. Try to bake, boil, grill, or broil it instead. You can also reduce fat by: Removing the skin from poultry. Removing all visible fats from meats. Steaming vegetables in water or broth. Meal planning  At meals, divide your plate into four equal parts: Fill one-half of your plate with vegetables and green salads. Fill one-fourth of your plate with whole grains. Fill one-fourth of your plate with lean protein foods. Eat 4-5 servings of vegetables per day. A serving of vegetables is: 1 cup of raw or cooked vegetables. 2 cups of raw leafy greens. Eat 4-5 servings of fruit per day. A serving of fruit is: 1 medium whole fruit.  cup of dried fruit.  cup of fresh, frozen, or canned fruit.  cup of 100% fruit juice. Eat more foods that have soluble fiber. These are apples, broccoli, carrots, beans, peas, and barley. Try to get 20-30 g of fiber per day. Eat 4-5 servings of nuts, legumes, and seeds per week: 1 serving of dried beans or legumes equals  cup after being cooked. 1 serving of nuts is  cup. 1 serving of seeds equals 1 tablespoon. General information Eat more home-cooked food. Eat less restaurant, buffet, and fast food. Limit or avoid alcohol. Limit foods that are high in starch and sugar. Avoid fried foods. Lose weight if you are overweight. Keep track of how much salt (sodium) you eat. This is important if you have high blood  pressure. Ask your doctor to tell you more about this. Try to add vegetarian meals each week. Fats Choose healthy fats. These include olive oil and canola oil, flaxseeds, walnuts, almonds, and seeds. Eat more omega-3 fats. These include salmon, mackerel, sardines, tuna, flaxseed oil, and ground flaxseeds. Try to eat fish at least 2 times each week. Check food labels. Avoid foods with trans fats or high amounts of saturated fat. Limit saturated fats. These are often found in animal products, such as meats, butter, and cream. These are also found in plant foods, such as palm oil, palm kernel oil, and coconut oil. Avoid foods with partially hydrogenated oils in them. These have trans fats. Examples are stick margarine, some tub margarines, cookies, crackers, and other baked goods. What foods can I eat? Fruits All fresh, canned (in natural juice), or frozen fruits. Vegetables Fresh or frozen vegetables (raw, steamed, roasted, or grilled). Green salads. Grains Most grains. Choose whole wheat and whole grains most of the time. Rice and pasta, including brown rice and pastas made with whole wheat. Meats and other proteins Lean, well-trimmed beef, veal, pork, and lamb. Chicken and turkey without skin. All fish and shellfish. Wild duck, rabbit, pheasant, and venison. Egg whites or low-cholesterol egg substitutes. Dried beans, peas, lentils, and tofu. Seeds and most nuts. Dairy Low-fat or nonfat cheeses, including ricotta and mozzarella. Skim or 1% milk that is liquid, powdered, or evaporated. Buttermilk that is made with low-fat milk. Nonfat or low-fat yogurt. Fats and oils Non-hydrogenated (trans-free) margarines. Vegetable oils, including   soybean, sesame, sunflower, olive, peanut, safflower, corn, canola, and cottonseed. Salad dressings or mayonnaise made with a vegetable oil. Beverages Mineral water. Coffee and tea. Diet carbonated beverages. Sweets and desserts Sherbet, gelatin, and fruit ice.  Small amounts of dark chocolate. Limit all sweets and desserts. Seasonings and condiments All seasonings and condiments. The items listed above may not be a complete list of foods and drinks you can eat. Contact a dietitian for more options. What foods should I avoid? Fruits Canned fruit in heavy syrup. Fruit in cream or butter sauce. Fried fruit. Limit coconut. Vegetables Vegetables cooked in cheese, cream, or butter sauce. Fried vegetables. Grains Breads that are made with saturated or trans fats, oils, or whole milk. Croissants. Sweet rolls. Donuts. High-fat crackers, such as cheese crackers. Meats and other proteins Fatty meats, such as hot dogs, ribs, sausage, bacon, rib-eye roast or steak. High-fat deli meats, such as salami and bologna. Caviar. Domestic duck and goose. Organ meats, such as liver. Dairy Cream, sour cream, cream cheese, and creamed cottage cheese. Whole-milk cheeses. Whole or 2% milk that is liquid, evaporated, or condensed. Whole buttermilk. Cream sauce or high-fat cheese sauce. Yogurt that is made from whole milk. Fats and oils Meat fat, or shortening. Cocoa butter, hydrogenated oils, palm oil, coconut oil, palm kernel oil. Solid fats and shortenings, including bacon fat, salt pork, lard, and butter. Nondairy cream substitutes. Salad dressings with cheese or sour cream. Beverages Regular sodas and juice drinks with added sugar. Sweets and desserts Frosting. Pudding. Cookies. Cakes. Pies. Milk chocolate or white chocolate. Buttered syrups. Full-fat ice cream or ice cream drinks. The items listed above may not be a complete list of foods and drinks to avoid. Contact a dietitian for more information. Summary Heart-healthy meal planning includes eating less unhealthy fats, eating more healthy fats, and making other changes in your diet. Eat a balanced diet. This includes fruits and vegetables, low-fat or nonfat dairy, lean protein, nuts and legumes, whole grains, and  heart-healthy oils and fats. This information is not intended to replace advice given to you by your health care provider. Make sure you discuss any questions you have with your health care provider. Document Revised: 03/09/2021 Document Reviewed: 03/09/2021 Elsevier Patient Education  2022 Elsevier Inc.  

## 2021-11-21 NOTE — Assessment & Plan Note (Signed)
Encourage diet and exercise for weight loss 

## 2021-11-21 NOTE — Progress Notes (Signed)
Subjective:    Patient ID: Jonathan [name], male    DOB: Jul 04, 1971, 51 y.o.   MRN: [id number]  HPI  Patient presents to clinic today for follow-up of chronic conditions.  Anxiety and Depression: Chronic, managed on Citalopram.  He takes Xanax only as needed for flying.  He is not currently seeing a therapist.  He denies SI/HI.  HTN: His BP today is 119/86.  He is taking Losartan and HCTZ as prescribed.  ECG from 05/2012 reviewed.  GERD: He denies breakthrough on Omeprazole.  There is no upper GI on file.  RLS: Persistent, managed on Ropinirole.  He does not follow with neurology.  Prediabetes: His last A1c was 5.9%, 01/2019.  He does not check his sugars.  He is not taking any oral diabetic medication at this time.  HLD with Aortic Atherosclerosis: His last LDL was 126, triglycerides 257, 01/2021.  He is not taking any cholesterol-lowering medication at this time.  He does not consume a low-fat diet.  Allergies: He reports chronic nasal congestion postnasal drip.  He is using his Albuterol inhaler 2 times weekly.  He is not taking an oral antihistamine or using any nasal sprays at this time.  He would also like a referral for colonoscopy.  Review of Systems     Past Medical History:  Diagnosis Date   Anxiety    Hypertension     Current Outpatient Medications  Medication Sig Dispense Refill   albuterol (VENTOLIN HFA) 108 (90 Base) MCG/ACT inhaler Inhale 2 puffs into the lungs every 6 (six) hours as needed. 8 g 1   ALPRAZolam (XANAX) 0.25 MG tablet Take 1 tablet (0.25 mg total) by mouth daily as needed for anxiety. (Patient taking differently: Take 0.25 mg by mouth daily as needed for anxiety (Only when he fly).) 10 tablet 0   citalopram (CELEXA) 40 MG tablet TAKE 1 TABLET BY MOUTH EVERY DAY 90 tablet 0   hydrochlorothiazide (MICROZIDE) 12.5 MG capsule TAKE 1 CAPSULE BY MOUTH EVERY DAY 90 capsule 1   losartan (COZAAR) 100 MG tablet TAKE 1/2 TABLET BY MOUTH DAILY 45 tablet 0    Multiple Vitamin (MULTIVITAMIN) tablet Take 1 tablet by mouth daily.     omeprazole (PRILOSEC) 40 MG capsule TAKE 1 CAPSULE BY MOUTH EVERY DAY 90 capsule 1   rOPINIRole (REQUIP) 2 MG tablet Take 1 tablet (2 mg total) by mouth 3 (three) times daily. 90 tablet 5   No current facility-administered medications for this visit.    Allergies  Allergen Reactions   Ace Inhibitors     Cough     Family History  Problem Relation Age of Onset   Cancer Mother        breast   Cancer Father        prostate    Social History   Socioeconomic History   Marital status: Married    Spouse name: Not on file   Number of children: 0   Years of education: Not on file   Highest education level: Not on file  Occupational History   Not on file  Tobacco Use   Smoking status: Former   Smokeless tobacco: Current    Types: Chew  Substance and Sexual Activity   Alcohol use: No    Alcohol/week: 0.0 standard drinks   Drug use: Never   Sexual activity: Not on file  Other Topics Concern   Not on file  Social History Narrative   Not on file   Social Determinants  of Health   Financial Resource Strain: Not on file  Food Insecurity: Not on file  Transportation Needs: Not on file  Physical Activity: Not on file  Stress: Not on file  Social Connections: Not on file  Intimate Partner Violence: Not on file     Constitutional: Denies fever, malaise, fatigue, headache or abrupt weight changes.  HEENT: Patient reports nasal congestion and postnasal drip.  Denies eye pain, eye redness, ear pain, ringing in the ears, wax buildup, runny nose,  bloody nose, or sore throat. Respiratory: Patient reports intermittent cough denies difficulty breathing, shortness of breath, or sputum production.   Cardiovascular: Denies chest pain, chest tightness, palpitations or swelling in the hands or feet.  Gastrointestinal: Denies abdominal pain, bloating, constipation, diarrhea or blood in the stool.  GU: Denies  urgency, frequency, pain with urination, burning sensation, blood in urine, odor or discharge. Musculoskeletal: Denies decrease in range of motion, difficulty with gait, muscle pain or joint pain and swelling.  Skin: Denies redness, rashes, lesions or ulcercations.  Neurological: Patient reports restless legs.  Denies dizziness, difficulty with memory, difficulty with speech or problems with balance and coordination.  Psych: Patient has a history of anxiety and depression.  Denies  SI/HI.  No other specific complaints in a complete review of systems (except as listed in HPI above).  Objective:   Physical Exam   BP 119/86    Pulse (!) 102    Temp 98 F (36.7 C) (Oral)    Ht 5' 8.5" (1.74 m)    Wt 249 lb (112.9 kg)    SpO2 97%    BMI 37.31 kg/m  Wt Readings from Last 3 Encounters:  11/21/21 249 lb (112.9 kg)  05/24/21 249 lb 9.6 oz (113.2 kg)  01/31/21 245 lb (111.1 kg)    General: Appears his stated age, obese, in NAD. Skin: Warm, dry and intact.  HEENT: Head: normal shape and size; Eyes: sclera white and EOMs intact;  Cardiovascular: Normal rate and rhythm. S1,S2 noted.  No murmur, rubs or gallops noted.  Pulmonary/Chest: Normal effort and positive vesicular breath sounds. No respiratory distress. No wheezes, rales or ronchi noted.  Abdomen: Normal bowel sounds.  Musculoskeletal: No difficulty with gait.  Neurological: Alert and oriented. Psychiatric: Mood and affect normal. Behavior is normal. Judgment and thought content normal.   BMET    Component Value Date/Time   NA 138 01/31/2021 1035   K 4.0 01/31/2021 1035   CL 101 01/31/2021 1035   CO2 29 01/31/2021 1035   GLUCOSE 69 (L) 01/31/2021 1035   BUN 15 01/31/2021 1035   CREATININE 0.90 01/31/2021 1035   CALCIUM 9.6 01/31/2021 1035   GFRNONAA 95.90 09/13/2010 1103    Lipid Panel     Component Value Date/Time   CHOL 182 01/31/2021 1035   TRIG 257.0 (H) 01/31/2021 1035   HDL 36.40 (L) 01/31/2021 1035   CHOLHDL 5  01/31/2021 1035   VLDL 51.4 (H) 01/31/2021 1035   LDLCALC 98 09/01/2020 1114    CBC    Component Value Date/Time   WBC 7.2 01/31/2021 1035   RBC 5.65 01/31/2021 1035   HGB 16.1 01/31/2021 1035   HCT 47.0 01/31/2021 1035   PLT 201.0 01/31/2021 1035   MCV 83.2 01/31/2021 1035   MCHC 34.2 01/31/2021 1035   RDW 13.2 01/31/2021 1035   LYMPHSABS 1.9 02/27/2016 1032   MONOABS 0.6 02/27/2016 1032   EOSABS 0.1 02/27/2016 1032   BASOSABS 0.0 02/27/2016 1032    Hgb  A1C Lab Results  Component Value Date   HGBA1C 5.9 01/31/2021           Assessment & Plan:     Webb Silversmith, NP This visit occurred during the SARS-CoV-2 public health emergency.  Safety protocols were in place, including screening questions prior to the visit, additional usage of staff PPE, and extensive cleaning of exam room while observing appropriate contact time as indicated for disinfecting solutions.

## 2021-11-21 NOTE — Assessment & Plan Note (Signed)
Continue Ropinirole Will monitor

## 2021-11-21 NOTE — Assessment & Plan Note (Signed)
Controlled on Lisinopril and HCTZ Reinforced DASH diet and exercise for weight loss

## 2021-11-22 ENCOUNTER — Telehealth: Payer: Self-pay

## 2021-11-22 ENCOUNTER — Other Ambulatory Visit: Payer: Self-pay

## 2021-11-22 DIAGNOSIS — Z1211 Encounter for screening for malignant neoplasm of colon: Secondary | ICD-10-CM

## 2021-11-22 MED ORDER — PEG 3350-KCL-NA BICARB-NACL 420 G PO SOLR: 4000.0000 mL | Freq: Once | ORAL | 0 refills | Status: AC

## 2021-11-22 NOTE — Progress Notes (Signed)
Gastroenterology Pre-Procedure Review  Request Date: 12/04/2021 Requesting Physician: Dr. Allen Norris  PATIENT REVIEW QUESTIONS: The patient responded to the following health history questions as indicated:    1. Are you having any GI issues? no 2. Do you have a personal history of Polyps? no 3. Do you have a family history of Colon Cancer or Polyps? no 4. Diabetes Mellitus? no 5. Joint replacements in the past 12 months?no 6. Major health problems in the past 3 months?no 7. Any artificial heart valves, MVP, or defibrillator?no    MEDICATIONS & ALLERGIES:    Patient reports the following regarding taking any anticoagulation/antiplatelet therapy:   Plavix, Coumadin, Eliquis, Xarelto, Lovenox, Pradaxa, Brilinta, or Effient? no Aspirin? no  Patient confirms/reports the following medications:  Current Outpatient Medications  Medication Sig Dispense Refill   albuterol (VENTOLIN HFA) 108 (90 Base) MCG/ACT inhaler Inhale 2 puffs into the lungs every 6 (six) hours as needed. 18 g 1   ALPRAZolam (XANAX) 0.25 MG tablet Take 1 tablet (0.25 mg total) by mouth daily as needed for anxiety. (Patient taking differently: Take 0.25 mg by mouth daily as needed for anxiety (Only when he fly).) 10 tablet 0   citalopram (CELEXA) 40 MG tablet TAKE 1 TABLET BY MOUTH EVERY DAY 90 tablet 0   hydrochlorothiazide (MICROZIDE) 12.5 MG capsule TAKE 1 CAPSULE BY MOUTH EVERY DAY 90 capsule 1   losartan (COZAAR) 100 MG tablet TAKE 1/2 TABLET BY MOUTH DAILY 45 tablet 0   Multiple Vitamin (MULTIVITAMIN) tablet Take 1 tablet by mouth daily.     omeprazole (PRILOSEC) 40 MG capsule TAKE 1 CAPSULE BY MOUTH EVERY DAY 90 capsule 1   rOPINIRole (REQUIP) 2 MG tablet Take 1 tablet (2 mg total) by mouth 3 (three) times daily. 90 tablet 5   No current facility-administered medications for this visit.    Patient confirms/reports the following allergies:  Allergies  Allergen Reactions   Ace Inhibitors     Cough     No orders of  the defined types were placed in this encounter.   AUTHORIZATION INFORMATION Primary Insurance: 1D#: Group #:  Secondary Insurance: 1D#: Group #:  SCHEDULE INFORMATION: Date: 12/04/2021 Time: Location: [place]

## 2021-11-22 NOTE — Telephone Encounter (Signed)
CALLED PATIENT NO ANSWER LEFT VOICEMAIL FOR A CALL BACK °Letter sent °

## 2021-11-22 NOTE — Telephone Encounter (Signed)
CALLED PATIENT NO ANSWER LEFT VOICEMAIL FOR A CALL BACK ? ?

## 2021-11-23 ENCOUNTER — Encounter: Payer: Self-pay | Admitting: Gastroenterology

## 2021-12-04 ENCOUNTER — Encounter: Payer: Self-pay | Admitting: Gastroenterology

## 2021-12-04 ENCOUNTER — Ambulatory Visit: Payer: Federal, State, Local not specified - PPO | Admitting: Anesthesiology

## 2021-12-04 ENCOUNTER — Other Ambulatory Visit: Payer: Self-pay

## 2021-12-04 ENCOUNTER — Encounter
Admission: RE | Disposition: A | Discharge status: Home / Self Care | Payer: Self-pay | Source: Home / Self Care | Attending: Gastroenterology

## 2021-12-04 ENCOUNTER — Ambulatory Visit
Admission: RE | Admit: 2021-12-04 | Discharge: 2021-12-04 | Disposition: A | Discharge status: Home / Self Care | Payer: Federal, State, Local not specified - PPO | Attending: Gastroenterology | Admitting: Gastroenterology

## 2021-12-04 ENCOUNTER — Encounter: Payer: Self-pay | Admitting: Internal Medicine

## 2021-12-04 DIAGNOSIS — I10 Essential (primary) hypertension: Secondary | ICD-10-CM | POA: Insufficient documentation

## 2021-12-04 DIAGNOSIS — D124 Benign neoplasm of descending colon: Secondary | ICD-10-CM | POA: Diagnosis not present

## 2021-12-04 DIAGNOSIS — Z1211 Encounter for screening for malignant neoplasm of colon: Secondary | ICD-10-CM | POA: Diagnosis not present

## 2021-12-04 DIAGNOSIS — K635 Polyp of colon: Secondary | ICD-10-CM

## 2021-12-04 DIAGNOSIS — K219 Gastro-esophageal reflux disease without esophagitis: Secondary | ICD-10-CM | POA: Diagnosis not present

## 2021-12-04 DIAGNOSIS — K640 First degree hemorrhoids: Secondary | ICD-10-CM | POA: Insufficient documentation

## 2021-12-04 DIAGNOSIS — Z87891 Personal history of nicotine dependence: Secondary | ICD-10-CM | POA: Insufficient documentation

## 2021-12-04 DIAGNOSIS — K5730 Diverticulosis of large intestine without perforation or abscess without bleeding: Secondary | ICD-10-CM | POA: Diagnosis not present

## 2021-12-04 DIAGNOSIS — D122 Benign neoplasm of ascending colon: Secondary | ICD-10-CM | POA: Diagnosis not present

## 2021-12-04 HISTORY — DX: Gastro-esophageal reflux disease without esophagitis: K21.9

## 2021-12-04 HISTORY — PX: POLYPECTOMY: SHX5525

## 2021-12-04 HISTORY — PX: COLONOSCOPY WITH PROPOFOL: SHX5780

## 2021-12-04 SURGERY — COLONOSCOPY WITH PROPOFOL
Anesthesia: General | Site: Rectum

## 2021-12-04 MED ORDER — ACETAMINOPHEN 160 MG/5ML PO SOLN: 325.0000 mg | ORAL | Status: DC | PRN

## 2021-12-04 MED ORDER — STERILE WATER FOR IRRIGATION IR SOLN
Status: DC | PRN
  Administered 2021-12-04: 1

## 2021-12-04 MED ORDER — PROPOFOL 10 MG/ML IV BOLUS
INTRAVENOUS | Status: DC | PRN
  Administered 2021-12-04: 20 mg via INTRAVENOUS
  Administered 2021-12-04: 30 mg via INTRAVENOUS
  Administered 2021-12-04: 150 mg via INTRAVENOUS
  Administered 2021-12-04: 50 mg via INTRAVENOUS
  Administered 2021-12-04: 20 mg via INTRAVENOUS
  Administered 2021-12-04: 30 mg via INTRAVENOUS

## 2021-12-04 MED ORDER — LIDOCAINE HCL (CARDIAC) PF 100 MG/5ML IV SOSY
PREFILLED_SYRINGE | INTRAVENOUS | Status: DC | PRN
  Administered 2021-12-04: 30 mg via INTRAVENOUS

## 2021-12-04 MED ORDER — ACETAMINOPHEN 325 MG PO TABS: 325.0000 mg | ORAL_TABLET | ORAL | Status: DC | PRN

## 2021-12-04 MED ORDER — LACTATED RINGERS IV SOLN: INTRAVENOUS | Status: DC

## 2021-12-04 MED ORDER — SODIUM CHLORIDE 0.9 % IV SOLN: INTRAVENOUS | Status: DC

## 2021-12-04 SURGICAL SUPPLY — 8 items
GOWN CVR UNV OPN BCK APRN NK (MISCELLANEOUS) ×2 IMPLANT
GOWN ISOL THUMB LOOP REG UNIV (MISCELLANEOUS) ×6
KIT PRC NS LF DISP ENDO (KITS) ×1 IMPLANT
KIT PROCEDURE OLYMPUS (KITS) ×3
MANIFOLD NEPTUNE II (INSTRUMENTS) ×3 IMPLANT
SNARE SHORT THROW 13M SML OVAL (MISCELLANEOUS) ×2 IMPLANT
TRAP ETRAP POLY (MISCELLANEOUS) ×2 IMPLANT
WATER STERILE IRR 250ML POUR (IV SOLUTION) ×3 IMPLANT

## 2021-12-04 NOTE — Anesthesia Preprocedure Evaluation (Signed)
Anesthesia Evaluation  Patient identified by MRN, date of birth, ID band Patient awake    Reviewed: Allergy & Precautions, H&P , NPO status , Patient's Chart, lab work & pertinent test results, reviewed documented beta blocker date and time   Airway Mallampati: III  TM Distance: >3 FB Neck ROM: full    Dental no notable dental hx.    Pulmonary former smoker,    Pulmonary exam normal breath sounds clear to auscultation       Cardiovascular Exercise Tolerance: Good hypertension, Normal cardiovascular exam Rhythm:regular Rate:Normal     Neuro/Psych negative neurological ROS  negative psych ROS   GI/Hepatic Neg liver ROS, GERD  ,  Endo/Other  negative endocrine ROS  Renal/GU negative Renal ROS  negative genitourinary   Musculoskeletal   Abdominal   Peds  Hematology negative hematology ROS (+)   Anesthesia Other Findings   Reproductive/Obstetrics negative OB ROS                             Anesthesia Physical Anesthesia Plan  ASA: 2  Anesthesia Plan: General   Post-op Pain Management:    Induction:   PONV Risk Score and Plan:   Airway Management Planned:   Additional Equipment:   Intra-op Plan:   Post-operative Plan:   Informed Consent: I have reviewed the patients History and Physical, chart, labs and discussed the procedure including the risks, benefits and alternatives for the proposed anesthesia with the patient or authorized representative who has indicated his/her understanding and acceptance.     Dental Advisory Given  Plan Discussed with: CRNA and Anesthesiologist  Anesthesia Plan Comments:         Anesthesia Quick Evaluation

## 2021-12-04 NOTE — Op Note (Signed)
[facility] Gastroenterology Patient Name: Jonathan [name] Procedure Date: 12/04/2021 11:00 AM MRN: [id number] Account #: [id number] Date of Birth: 1971/03/22 Admit Type: Outpatient Age: 51 Room: [facility] OR ROOM 01 Gender: Male Note Status: Finalized Instrument Name: [id number] Procedure:             Colonoscopy Indications:           Screening for colorectal malignant neoplasm Providers:             Lucilla Lame MD, MD Referring MD:          Jearld Fenton (Referring MD) Medicines:             Propofol per Anesthesia Complications:         No immediate complications. Procedure:             Pre-Anesthesia Assessment:                        - Prior to the procedure, a History and Physical was                         performed, and patient medications and allergies were                         reviewed. The patient's tolerance of previous                         anesthesia was also reviewed. The risks and benefits                         of the procedure and the sedation options and risks                         were discussed with the patient. All questions were                         answered, and informed consent was obtained. Prior                         Anticoagulants: The patient has taken no previous                         anticoagulant or antiplatelet agents. ASA Grade                         Assessment: II - A patient with mild systemic disease.                         After reviewing the risks and benefits, the patient                         was deemed in satisfactory condition to undergo the                         procedure.                        After obtaining informed consent, the colonoscope was  passed under direct vision. Throughout the procedure,                         the patient's blood pressure, pulse, and oxygen                         saturations were monitored continuously. The was                         introduced  through the anus and advanced to the the                         cecum, identified by appendiceal orifice and ileocecal                         valve. The colonoscopy was performed without                         difficulty. The patient tolerated the procedure well.                         The quality of the bowel preparation was excellent. Findings:      The perianal and digital rectal examinations were normal.      Two sessile polyps were found in the ascending colon. The polyps were 3       to 5 mm in size. These polyps were removed with a cold snare. Resection       and retrieval were complete.      A 5 mm polyp was found in the transverse colon. The polyp was       pedunculated. The polyp was removed with a cold snare. Resection and       retrieval were complete.      Two pedunculated polyps were found in the descending colon. The polyps       were 4 to 6 mm in size. These polyps were removed with a cold snare.       Resection and retrieval were complete.      Multiple small-mouthed diverticula were found in the sigmoid colon.      Non-bleeding internal hemorrhoids were found during retroflexion. The       hemorrhoids were Grade I (internal hemorrhoids that do not prolapse). Impression:            - Two 3 to 5 mm polyps in the ascending colon, removed                         with a cold snare. Resected and retrieved.                        - One 5 mm polyp in the transverse colon, removed with                         a cold snare. Resected and retrieved.                        - Two 4 to 6 mm polyps in the descending colon,  removed with a cold snare. Resected and retrieved.                        - Diverticulosis in the sigmoid colon.                        - Non-bleeding internal hemorrhoids. Recommendation:        - Discharge patient to home.                        - Resume previous diet.                        - Continue present medications.                         - Await pathology results.                        - If the pathology report reveals adenomatous tissue,                         then repeat the colonoscopy for surveillance in 3                         years. Procedure Code(s):     --- Professional ---                        [phone], Colonoscopy, flexible; with removal of                         tumor(s), polyp(s), or other lesion(s) by snare                         technique Diagnosis Code(s):     --- Professional ---                        Z12.11, Encounter for screening for malignant neoplasm                         of colon                        K63.5, Polyp of colon CPT copyright 2019 American Medical Association. All rights reserved. The codes documented in this report are preliminary and upon coder review may  be revised to meet current compliance requirements. Lucilla Lame MD, MD 12/04/2021 11:19:29 AM This report has been signed electronically. Number of Addenda: 0 Note Initiated On: 12/04/2021 11:00 AM Scope Withdrawal Time: 0 hours 7 minutes 16 seconds  Total Procedure Duration: 0 hours 11 minutes 1 second  Estimated Blood Loss:  Estimated blood loss: none.      [facility]

## 2021-12-04 NOTE — Anesthesia Postprocedure Evaluation (Signed)
Anesthesia Post Note  Patient: Jonathan [name]  Procedure(s) Performed: COLONOSCOPY WITH BIOPSIES (Rectum) POLYPECTOMY (Rectum)     Patient location during evaluation: PACU Anesthesia Type: General Level of consciousness: awake and alert Pain management: pain level controlled Vital Signs Assessment: post-procedure vital signs reviewed and stable Respiratory status: spontaneous breathing, nonlabored ventilation, respiratory function stable and patient connected to nasal cannula oxygen Cardiovascular status: blood pressure returned to baseline and stable Postop Assessment: no apparent nausea or vomiting Anesthetic complications: no   No notable events documented.  Trecia Rogers

## 2021-12-04 NOTE — Transfer of Care (Signed)
Immediate Anesthesia Transfer of Care Note  Patient: Jonathan [name]  Procedure(s) Performed: COLONOSCOPY WITH BIOPSIES (Rectum) POLYPECTOMY (Rectum)  Patient Location: PACU  Anesthesia Type: General  Level of Consciousness: awake, alert  and patient cooperative  Airway and Oxygen Therapy: Patient Spontanous Breathing and Patient connected to supplemental oxygen  Post-op Assessment: Post-op Vital signs reviewed, Patient's Cardiovascular Status Stable, Respiratory Function Stable, Patent Airway and No signs of Nausea or vomiting  Post-op Vital Signs: Reviewed and stable  Complications: No notable events documented.

## 2021-12-04 NOTE — Anesthesia Procedure Notes (Signed)
Date/Time: 12/04/2021 11:02 AM Performed by: Cameron Ali, CRNA Pre-anesthesia Checklist: Patient identified, Emergency Drugs available, Suction available, Timeout performed and Patient being monitored Patient Re-evaluated:Patient Re-evaluated prior to induction Oxygen Delivery Method: Nasal cannula Placement Confirmation: positive ETCO2

## 2021-12-04 NOTE — H&P (Signed)
Lucilla Lame, MD [address] [place], [place] [address] Phone: [phone] Fax : [phone]  Primary Care Physician:  Jearld Fenton, NP Primary Gastroenterologist:  Dr. Allen Norris  Pre-Procedure History & Physical: HPI:  Jonathan [name] is a 51 y.o. male is here for a screening colonoscopy.   Past Medical History:  Diagnosis Date   Anxiety    GERD (gastroesophageal reflux disease)    Hypertension     Past Surgical History:  Procedure Laterality Date   WISDOM TOOTH EXTRACTION  1997    Prior to Admission medications   Medication Sig Start Date End Date Taking? Authorizing Provider  albuterol (VENTOLIN HFA) 108 (90 Base) MCG/ACT inhaler Inhale 2 puffs into the lungs every 6 (six) hours as needed. 11/21/21  Yes Baity, Coralie Keens, NP  citalopram (CELEXA) 40 MG tablet TAKE 1 TABLET BY MOUTH EVERY DAY 11/03/21  Yes Baity, Coralie Keens, NP  hydrochlorothiazide (MICROZIDE) 12.5 MG capsule TAKE 1 CAPSULE BY MOUTH EVERY DAY 10/30/21  Yes Baity, Coralie Keens, NP  losartan (COZAAR) 100 MG tablet TAKE 1/2 TABLET BY MOUTH DAILY 10/30/21  Yes Jearld Fenton, NP  Multiple Vitamin (MULTIVITAMIN) tablet Take 1 tablet by mouth daily.   Yes [provider]  omeprazole (PRILOSEC) 40 MG capsule TAKE 1 CAPSULE BY MOUTH EVERY DAY 08/29/21  Yes Baity, Coralie Keens, NP  rOPINIRole (REQUIP) 2 MG tablet Take 1 tablet (2 mg total) by mouth 3 (three) times daily. 05/24/21  Yes Baity, Coralie Keens, NP  ALPRAZolam Duanne Moron) 0.25 MG tablet Take 1 tablet (0.25 mg total) by mouth daily as needed for anxiety. Patient taking differently: Take 0.25 mg by mouth daily as needed for anxiety (Only when he fly). 01/31/21   Jearld Fenton, NP    Allergies as of 11/22/2021 - Review Complete 11/21/2021  Allergen Reaction Noted   Ace inhibitors  02/12/2012    Family History  Problem Relation Age of Onset   Cancer Mother        breast   Cancer Father        prostate    Social History   Socioeconomic  History   Marital status: Married    Spouse name: Not on file   Number of children: 0   Years of education: Not on file   Highest education level: Not on file  Occupational History   Not on file  Tobacco Use   Smoking status: Former    Types: Cigarettes    Quit date: 1980    Years since quitting: 43.0   Smokeless tobacco: Current    Types: Chew  Vaping Use   Vaping Use: Never used  Substance and Sexual Activity   Alcohol use: No    Alcohol/week: 0.0 standard drinks   Drug use: Never   Sexual activity: Not on file  Other Topics Concern   Not on file  Social History Narrative   Not on file   Social Determinants of Health   Financial Resource Strain: Not on file  Food Insecurity: Not on file  Transportation Needs: Not on file  Physical Activity: Not on file  Stress: Not on file  Social Connections: Not on file  Intimate Partner Violence: Not on file    Review of Systems: See HPI, otherwise negative ROS  Physical Exam: BP (!) 117/93    Pulse 94    Temp 97.7 F (36.5 C) (Temporal)    Ht 5' 8.5" (1.74 m)    Wt 110.2 kg  SpO2 95%    BMI 36.41 kg/m  General:   Alert,  pleasant and cooperative in NAD Head:  Normocephalic and atraumatic. Neck:  Supple; no masses or thyromegaly. Lungs:  Clear throughout to auscultation.    Heart:  Regular rate and rhythm. Abdomen:  Soft, nontender and nondistended. Normal bowel sounds, without guarding, and without rebound.   Neurologic:  Alert and  oriented x4;  grossly normal neurologically.  Impression/Plan: Jonathan [name] is now here to undergo a screening colonoscopy.  Risks, benefits, and alternatives regarding colonoscopy have been reviewed with the patient.  Questions have been answered.  All parties agreeable.

## 2021-12-05 ENCOUNTER — Encounter: Payer: Self-pay | Admitting: Gastroenterology

## 2021-12-05 LAB — SURGICAL PATHOLOGY

## 2021-12-13 ENCOUNTER — Other Ambulatory Visit: Payer: Self-pay

## 2021-12-13 ENCOUNTER — Ambulatory Visit: Payer: Federal, State, Local not specified - PPO | Admitting: Internal Medicine

## 2021-12-13 ENCOUNTER — Encounter: Payer: Self-pay | Admitting: Internal Medicine

## 2021-12-13 VITALS — BP 110/85 | HR 100 | Temp 97.5°F | Wt 250.0 lb

## 2021-12-13 DIAGNOSIS — R0681 Apnea, not elsewhere classified: Secondary | ICD-10-CM | POA: Diagnosis not present

## 2021-12-13 DIAGNOSIS — G2581 Restless legs syndrome: Secondary | ICD-10-CM | POA: Diagnosis not present

## 2021-12-13 DIAGNOSIS — Z6837 Body mass index (BMI) 37.0-37.9, adult: Secondary | ICD-10-CM

## 2021-12-13 DIAGNOSIS — R0683 Snoring: Secondary | ICD-10-CM | POA: Diagnosis not present

## 2021-12-13 NOTE — Patient Instructions (Signed)
Sleep Apnea ?Sleep apnea affects breathing during sleep. It causes breathing to stop for 10 seconds or more, or to become shallow. People with sleep apnea usually snore loudly. ?It can also increase the risk of: ?Heart attack. ?Stroke. ?Being very overweight (obese). ?Diabetes. ?Heart failure. ?Irregular heartbeat. ?High blood pressure. ?The goal of treatment is to help you breathe normally again. ?What are the causes? ?The most common cause of this condition is a collapsed or blocked airway. ?There are three kinds of sleep apnea: ?Obstructive sleep apnea. This is caused by a blocked or collapsed airway. ?Central sleep apnea. This happens when the brain does not send the right signals to the muscles that control breathing. ?Mixed sleep apnea. This is a combination of obstructive and central sleep apnea. ?What increases the risk? ?Being overweight. ?Smoking. ?Having a small airway. ?Being older. ?Being male. ?Drinking alcohol. ?Taking medicines to calm yourself (sedatives or tranquilizers). ?Having family members with the condition. ?Having a tongue or tonsils that are larger than normal. ?What are the signs or symptoms? ?Trouble staying asleep. ?Loud snoring. ?Headaches in the morning. ?Waking up gasping. ?Dry mouth or sore throat in the morning. ?Being sleepy or tired during the day. ?If you are sleepy or tired during the day, you may also: ?Not be able to focus your mind (concentrate). ?Forget things. ?Get angry a lot and have mood swings. ?Feel sad (depressed). ?Have changes in your personality. ?Have less interest in sex, if you are male. ?Be unable to have an erection, if you are male. ?How is this treated? ? ?Sleeping on your side. ?Using a medicine to get rid of mucus in your nose (decongestant). ?Avoiding the use of alcohol, medicines to help you relax, or certain pain medicines (narcotics). ?Losing weight, if needed. ?Changing your diet. ?Quitting smoking. ?Using a machine to open your airway while you  sleep, such as: ?An oral appliance. This is a mouthpiece that shifts your lower jaw forward. ?A CPAP device. This device blows air through a mask when you breathe out (exhale). ?An EPAP device. This has valves that you put in each nostril. ?A BIPAP device. This device blows air through a mask when you breathe in (inhale) and breathe out. ?Having surgery if other treatments do not work. ?Follow these instructions at home: ?Lifestyle ?Make changes that your doctor recommends. ?Eat a healthy diet. ?Lose weight if needed. ?Avoid alcohol, medicines to help you relax, and some pain medicines. ?Do not smoke or use any products that contain nicotine or tobacco. If you need help quitting, ask your doctor. ?General instructions ?Take over-the-counter and prescription medicines only as told by your doctor. ?If you were given a machine to use while you sleep, use it only as told by your doctor. ?If you are having surgery, make sure to tell your doctor you have sleep apnea. You may need to bring your device with you. ?Keep all follow-up visits. ?Contact a doctor if: ?The machine that you were given to use during sleep bothers you or does not seem to be working. ?You do not get better. ?You get worse. ?Get help right away if: ?Your chest hurts. ?You have trouble breathing in enough air. ?You have an uncomfortable feeling in your back, arms, or stomach. ?You have trouble talking. ?One side of your body feels weak. ?A part of your face is hanging down. ?These symptoms may be an emergency. Get help right away. Call your local emergency services (911 in the [place] ?Do not wait to see if the symptoms   will go away. ?Do not drive yourself to the hospital. ?Summary ?This condition affects breathing during sleep. ?The most common cause is a collapsed or blocked airway. ?The goal of treatment is to help you breathe normally while you sleep. ?This information is not intended to replace advice given to you by your health care provider. Make  sure you discuss any questions you have with your health care provider. ?Document Revised: 06/07/2021 Document Reviewed: 10/07/2020 ?Elsevier Patient Education ? 2022 Elsevier Inc. ? ?

## 2021-12-13 NOTE — Progress Notes (Signed)
Subjective:    Patient ID: Jonathan [name], male    DOB: [birth year], 51 y.o.   MRN: [id number]  HPI  Patient presents to clinic today requesting referral for sleep study. His wife reports his surgeon had some concerns that he may have sleep apnea because he desatted into the 70's under anesthesia. He reports he does snore. His wife has witnessed apneic spells. He does feel rested when he wakes up but does occasionally nap in the afternoons when he gets off work. He also has a history of RLS.  Results of the Epworth flowsheet 12/13/2021  Sitting and reading 0  Watching TV 1  Sitting, inactive in a public place (e.g. a theatre or a meeting) 0  As a passenger in a car for an hour without a break 1  Lying down to rest in the afternoon when circumstances permit 3  Sitting and talking to someone 0  Sitting quietly after a lunch without alcohol 1  In a car, while stopped for a few minutes in traffic 0  Total score 6    Neck Size 18in         45cm   Review of Systems     Past Medical History:  Diagnosis Date   Anxiety    GERD (gastroesophageal reflux disease)    Hypertension     Current Outpatient Medications  Medication Sig Dispense Refill   albuterol (VENTOLIN HFA) 108 (90 Base) MCG/ACT inhaler Inhale 2 puffs into the lungs every 6 (six) hours as needed. 18 g 1   ALPRAZolam (XANAX) 0.25 MG tablet Take 1 tablet (0.25 mg total) by mouth daily as needed for anxiety. (Patient taking differently: Take 0.25 mg by mouth daily as needed for anxiety (Only when he fly).) 10 tablet 0   citalopram (CELEXA) 40 MG tablet TAKE 1 TABLET BY MOUTH EVERY DAY 90 tablet 0   hydrochlorothiazide (MICROZIDE) 12.5 MG capsule TAKE 1 CAPSULE BY MOUTH EVERY DAY 90 capsule 1   losartan (COZAAR) 100 MG tablet TAKE 1/2 TABLET BY MOUTH DAILY 45 tablet 0   Multiple Vitamin (MULTIVITAMIN) tablet Take 1 tablet by mouth daily.     omeprazole (PRILOSEC) 40 MG capsule TAKE 1 CAPSULE BY MOUTH EVERY DAY 90 capsule 1    rOPINIRole (REQUIP) 2 MG tablet Take 1 tablet (2 mg total) by mouth 3 (three) times daily. 90 tablet 5   No current facility-administered medications for this visit.    Allergies  Allergen Reactions   Ace Inhibitors     Cough     Family History  Problem Relation Age of Onset   Cancer Mother        breast   Cancer Father        prostate    Social History   Socioeconomic History   Marital status: Married    Spouse name: Not on file   Number of children: 0   Years of education: Not on file   Highest education level: Not on file  Occupational History   Not on file  Tobacco Use   Smoking status: Former    Types: Cigarettes    Quit date: 1980    Years since quitting: 43.1   Smokeless tobacco: Current    Types: Chew  Vaping Use   Vaping Use: Never used  Substance and Sexual Activity   Alcohol use: No    Alcohol/week: 0.0 standard drinks   Drug use: Never   Sexual activity: Not on file  Other Topics Concern  Not on file  Social History Narrative   Not on file   Social Determinants of Health   Financial Resource Strain: Not on file  Food Insecurity: Not on file  Transportation Needs: Not on file  Physical Activity: Not on file  Stress: Not on file  Social Connections: Not on file  Intimate Partner Violence: Not on file     Constitutional: Denies fever, malaise, fatigue, headache or abrupt weight changes.  HEENT: Denies eye pain, eye redness, ear pain, ringing in the ears, wax buildup, runny nose, nasal congestion, bloody nose, or sore throat. Respiratory: Denies difficulty breathing, shortness of breath, cough or sputum production.   Cardiovascular: Denies chest pain, chest tightness, palpitations or swelling in the hands or feet.  Neurological: Pt reports snoring, witnessed apneic spells and RLS. Denies dizziness, difficulty with memory, difficulty with speech or problems with balance and coordination.    No other specific complaints in a complete  review of systems (except as listed in HPI above).  Objective:   Physical Exam   BP 110/85 (BP Location: Left Arm, Patient Position: Sitting, Cuff Size: Large)    Pulse 100    Temp (!) 97.5 F (36.4 C) (Temporal)    Wt 250 lb (113.4 kg)    SpO2 100%    BMI 37.46 kg/m   Wt Readings from Last 3 Encounters:  12/04/21 243 lb (110.2 kg)  11/21/21 249 lb (112.9 kg)  05/24/21 249 lb 9.6 oz (113.2 kg)    General: Appears his stated age, obese, in NAD. Cardiovascular: Tachycardic with normal rhythm. S1,S2 noted.  No murmur, rubs or gallops noted.  Pulmonary/Chest: Normal effort and positive vesicular breath sounds. No respiratory distress. No wheezes, rales or ronchi noted.  Neurological: Alert and oriented.   BMET    Component Value Date/Time   NA 138 01/31/2021 1035   K 4.0 01/31/2021 1035   CL 101 01/31/2021 1035   CO2 29 01/31/2021 1035   GLUCOSE 69 (L) 01/31/2021 1035   BUN 15 01/31/2021 1035   CREATININE 0.90 01/31/2021 1035   CALCIUM 9.6 01/31/2021 1035   GFRNONAA 95.90 09/13/2010 1103    Lipid Panel     Component Value Date/Time   CHOL 182 01/31/2021 1035   TRIG 257.0 (H) 01/31/2021 1035   HDL 36.40 (L) 01/31/2021 1035   CHOLHDL 5 01/31/2021 1035   VLDL 51.4 (H) 01/31/2021 1035   LDLCALC 98 09/01/2020 1114    CBC    Component Value Date/Time   WBC 7.2 01/31/2021 1035   RBC 5.65 01/31/2021 1035   HGB 16.1 01/31/2021 1035   HCT 47.0 01/31/2021 1035   PLT 201.0 01/31/2021 1035   MCV 83.2 01/31/2021 1035   MCHC 34.2 01/31/2021 1035   RDW 13.2 01/31/2021 1035   LYMPHSABS 1.9 02/27/2016 1032   MONOABS 0.6 02/27/2016 1032   EOSABS 0.1 02/27/2016 1032   BASOSABS 0.0 02/27/2016 1032    Hgb A1C Lab Results  Component Value Date   HGBA1C 5.9 01/31/2021           Assessment & Plan:   Snoring, Witnessed Apneic Spells, Class 2 Obesity and RLS:  Symptoms concerning for sleep apnea ESS score of 6 Neck circumference 18 inches Encouraged weight loss as  this can help reduce sleep apnea symptoms Referral to pulmonology for consideration of sleep study  Return precautions discussed Webb Silversmith, NP This visit occurred during the SARS-CoV-2 public health emergency.  Safety protocols were in place, including screening questions prior to the  visit, additional usage of staff PPE, and extensive cleaning of exam room while observing appropriate contact time as indicated for disinfecting solutions.

## 2022-01-20 DIAGNOSIS — R062 Wheezing: Secondary | ICD-10-CM | POA: Diagnosis not present

## 2022-01-20 DIAGNOSIS — Z79899 Other long term (current) drug therapy: Secondary | ICD-10-CM | POA: Diagnosis not present

## 2022-01-20 DIAGNOSIS — M62838 Other muscle spasm: Secondary | ICD-10-CM | POA: Diagnosis not present

## 2022-01-20 DIAGNOSIS — R059 Cough, unspecified: Secondary | ICD-10-CM | POA: Diagnosis not present

## 2022-01-24 ENCOUNTER — Ambulatory Visit (INDEPENDENT_AMBULATORY_CARE_PROVIDER_SITE_OTHER): Payer: Federal, State, Local not specified - PPO | Admitting: Internal Medicine

## 2022-01-24 ENCOUNTER — Other Ambulatory Visit: Payer: Self-pay

## 2022-01-24 ENCOUNTER — Encounter: Payer: Self-pay | Admitting: Internal Medicine

## 2022-01-24 ENCOUNTER — Other Ambulatory Visit: Payer: Self-pay | Admitting: Internal Medicine

## 2022-01-24 VITALS — BP 139/89 | HR 84 | Temp 97.5°F | Ht 69.0 in | Wt 251.0 lb

## 2022-01-24 DIAGNOSIS — R252 Cramp and spasm: Secondary | ICD-10-CM

## 2022-01-24 DIAGNOSIS — R7303 Prediabetes: Secondary | ICD-10-CM

## 2022-01-24 DIAGNOSIS — Z6837 Body mass index (BMI) 37.0-37.9, adult: Secondary | ICD-10-CM

## 2022-01-24 DIAGNOSIS — Z114 Encounter for screening for human immunodeficiency virus [HIV]: Secondary | ICD-10-CM

## 2022-01-24 DIAGNOSIS — J209 Acute bronchitis, unspecified: Secondary | ICD-10-CM

## 2022-01-24 DIAGNOSIS — Z0001 Encounter for general adult medical examination with abnormal findings: Secondary | ICD-10-CM | POA: Diagnosis not present

## 2022-01-24 DIAGNOSIS — Z125 Encounter for screening for malignant neoplasm of prostate: Secondary | ICD-10-CM | POA: Diagnosis not present

## 2022-01-24 DIAGNOSIS — Z1159 Encounter for screening for other viral diseases: Secondary | ICD-10-CM

## 2022-01-24 MED ORDER — PREDNISONE 10 MG PO TABS: ORAL_TABLET | ORAL | 0 refills | Status: DC

## 2022-01-24 NOTE — Progress Notes (Signed)
? ?Subjective:  ? ? Patient ID: Jonathan [name], male    DOB: 10-Jul-1971, 51 y.o.   MRN: [id number] ? ?HPI ? ?Patient presents to clinic today for his annual exam. ? ?Flu: never ?Tetanus: 02/2016 ?COVID: Moderna x3 ?Shingrix: Never ?PSA screening: Never ?Colon screening: 11/2021 ?Vision screening: annually ?Dentist: biannually ? ?Diet: He does eat some meat. He consumes fruits and veggies. He tries to avoid fried foods. He drinks mostly water, some soda. ?Exercise: Working around the house ? ?Review of Systems ? ?Past Medical History:  ?Diagnosis Date  ? Anxiety   ? GERD (gastroesophageal reflux disease)   ? Hypertension   ? ? ?Current Outpatient Medications  ?Medication Sig Dispense Refill  ? albuterol (VENTOLIN HFA) 108 (90 Base) MCG/ACT inhaler Inhale 2 puffs into the lungs every 6 (six) hours as needed. 18 g 1  ? ALPRAZolam (XANAX) 0.25 MG tablet Take 1 tablet (0.25 mg total) by mouth daily as needed for anxiety. (Patient taking differently: Take 0.25 mg by mouth daily as needed for anxiety (Only when he fly).) 10 tablet 0  ? citalopram (CELEXA) 40 MG tablet TAKE 1 TABLET BY MOUTH EVERY DAY 90 tablet 0  ? hydrochlorothiazide (MICROZIDE) 12.5 MG capsule TAKE 1 CAPSULE BY MOUTH EVERY DAY 90 capsule 1  ? losartan (COZAAR) 100 MG tablet TAKE 1/2 TABLET BY MOUTH DAILY 45 tablet 0  ? Multiple Vitamin (MULTIVITAMIN) tablet Take 1 tablet by mouth daily.    ? omeprazole (PRILOSEC) 40 MG capsule TAKE 1 CAPSULE BY MOUTH EVERY DAY 90 capsule 1  ? rOPINIRole (REQUIP) 2 MG tablet Take 1 tablet (2 mg total) by mouth 3 (three) times daily. 90 tablet 5  ? ?No current facility-administered medications for this visit.  ? ? ?Allergies  ?Allergen Reactions  ? Ace Inhibitors   ?  Cough ?  ? ? ?Family History  ?Problem Relation Age of Onset  ? Cancer Mother   ?     breast  ? Cancer Father   ?     prostate  ? ? ?Social History  ? ?Socioeconomic History  ? Marital status: Married  ?  Spouse name: Not on file  ? Number of children: 0   ? Years of education: Not on file  ? Highest education level: Not on file  ?Occupational History  ? Not on file  ?Tobacco Use  ? Smoking status: Former  ?  Types: Cigarettes  ?  Quit date: 27  ?  Years since quitting: 43.2  ? Smokeless tobacco: Current  ?  Types: Chew  ?Vaping Use  ? Vaping Use: Never used  ?Substance and Sexual Activity  ? Alcohol use: No  ?  Alcohol/week: 0.0 standard drinks  ? Drug use: Never  ? Sexual activity: Not on file  ?Other Topics Concern  ? Not on file  ?Social History Narrative  ? Not on file  ? ?Social Determinants of Health  ? ?Financial Resource Strain: Not on file  ?Food Insecurity: Not on file  ?Transportation Needs: Not on file  ?Physical Activity: Not on file  ?Stress: Not on file  ?Social Connections: Not on file  ?Intimate Partner Violence: Not on file  ? ? ? ?Constitutional: Denies fever, malaise, fatigue, headache or abrupt weight changes.  ?HEENT: Denies eye pain, eye redness, ear pain, ringing in the ears, wax buildup, runny nose, nasal congestion, bloody nose, or sore throat. ?Respiratory: Pt reports cough and wheezing. Denies difficulty breathing, shortness of breath, or sputum production.   ?  Cardiovascular: Denies chest pain, chest tightness, palpitations or swelling in the hands or feet.  ?Gastrointestinal: Pt reports RUQ abdominal pain. Denies abdominal pain, bloating, constipation, diarrhea or blood in the stool.  ?GU: Denies urgency, frequency, pain with urination, burning sensation, blood in urine, odor or discharge. ?Musculoskeletal: Denies decrease in range of motion, difficulty with gait, muscle pain or joint pain and swelling.  ?Skin: Denies redness, rashes, lesions or ulcercations.  ?Neurological: Patient reports restless legs.  Denies dizziness, difficulty with memory, difficulty with speech or problems with balance and coordination.  ?Psych: Patient has a history of anxiety and depression.  Denies SI/HI. ? ?No other specific complaints in a complete review  of systems (except as listed in HPI above). ? ?   ?Objective:  ? Physical Exam ? ?BP (!) 146/94 (BP Location: Left Arm, Patient Position: Sitting, Cuff Size: Large)   Pulse 84   Temp (!) 97.5 ?F (36.4 ?C) (Temporal)   Ht '5\' 9"'  (1.753 m)   Wt 251 lb (113.9 kg)   SpO2 99%   BMI 37.07 kg/m?  ? ?Wt Readings from Last 3 Encounters:  ?12/13/21 250 lb (113.4 kg)  ?12/04/21 243 lb (110.2 kg)  ?11/21/21 249 lb (112.9 kg)  ? ? ?General: Appears his stated age, obese, in NAD. ?Skin: Warm, dry and intact.  ?HEENT: Head: normal shape and size; Eyes: sclera white and EOMs intact;  ?Neck:  Neck supple, trachea midline. No masses, lumps or thyromegaly present.  ?Cardiovascular: Normal rate and rhythm. S1,S2 noted.  No murmur, rubs or gallops noted. No JVD or BLE edema. No carotid bruits noted. ?Pulmonary/Chest: Normal effort and positive vesicular breath sounds with expiratory wheeze noted in the RLL. No respiratory distress. No rales or ronchi noted.  ?Abdomen: Soft and nontender. Normal bowel sounds. No distention or masses noted. Liver, spleen and kidneys non palpable. ?Musculoskeletal: Strength 5/5 BUE/BLE. No difficulty with gait.  ?Neurological: Alert and oriented. Cranial nerves II-XII grossly intact. Coordination normal.  ?Psychiatric: Mood and affect normal. Behavior is normal. Judgment and thought content normal.  ? ? ?BMET ?   ?Component Value Date/Time  ? NA 138 01/31/2021 1035  ? K 4.0 01/31/2021 1035  ? CL 101 01/31/2021 1035  ? CO2 29 01/31/2021 1035  ? GLUCOSE 69 (L) 01/31/2021 1035  ? BUN 15 01/31/2021 1035  ? CREATININE 0.90 01/31/2021 1035  ? CALCIUM 9.6 01/31/2021 1035  ? GFRNONAA 95.90 09/13/2010 1103  ? ? ?Lipid Panel  ?   ?Component Value Date/Time  ? CHOL 182 01/31/2021 1035  ? TRIG 257.0 (H) 01/31/2021 1035  ? HDL 36.40 (L) 01/31/2021 1035  ? CHOLHDL 5 01/31/2021 1035  ? VLDL 51.4 (H) 01/31/2021 1035  ? [place] 98 09/01/2020 1114  ? ? ?CBC ?   ?Component Value Date/Time  ? WBC 7.2 01/31/2021 1035  ?  RBC 5.65 01/31/2021 1035  ? HGB 16.1 01/31/2021 1035  ? HCT 47.0 01/31/2021 1035  ? PLT 201.0 01/31/2021 1035  ? MCV 83.2 01/31/2021 1035  ? MCHC 34.2 01/31/2021 1035  ? RDW 13.2 01/31/2021 1035  ? LYMPHSABS 1.9 02/27/2016 1032  ? MONOABS 0.6 02/27/2016 1032  ? EOSABS 0.1 02/27/2016 1032  ? BASOSABS 0.0 02/27/2016 1032  ? ? ?Hgb A1C ?Lab Results  ?Component Value Date  ? HGBA1C 5.9 01/31/2021  ? ? ? ? ? ? ?   ?Assessment & Plan:  ? ?Preventative Health Maintenance: ? ?Encouraged him to get a flu shot in the fall ?Tetanus UTD ?Encouraged him  to get his COVID booster ?Discussed Shingrix vaccine, he will check coverage with his insurance company ?Colon screening UTD ?Encouraged him to consume a balanced diet and exercise regimen ?Advised him to see an eye doctor and dentist annually ?We will check CBC, c-Met, lipid, PSA, A1c, HIV and hep C today ? ?Muscle Cramping: ? ?Will check Magnesium, CMET ? ?Acute Bronchitis: ? ?RX for Pred Taper x 6 days ?No indication for abx at this time ? ?RTC in 6 months, follow-up chronic conditions ?Webb Silversmith, NP ?This visit occurred during the SARS-CoV-2 public health emergency.  Safety protocols were in place, including screening questions prior to the visit, additional usage of staff PPE, and extensive cleaning of exam room while observing appropriate contact time as indicated for disinfecting solutions.  ? ?

## 2022-01-25 ENCOUNTER — Telehealth: Payer: Self-pay

## 2022-01-25 LAB — COMPLETE METABOLIC PANEL WITH GFR
AG Ratio: 1.8 (calc) (ref 1.0–2.5)
ALT: 27 U/L (ref 9–46)
AST: 22 U/L (ref 10–35)
Albumin: 4.5 g/dL (ref 3.6–5.1)
Alkaline phosphatase (APISO): 74 U/L (ref 35–144)
BUN: 14 mg/dL (ref 7–25)
CO2: 26 mmol/L (ref 20–32)
Calcium: 9.4 mg/dL (ref 8.6–10.3)
Chloride: 102 mmol/L (ref 98–110)
Creat: 0.99 mg/dL (ref 0.70–1.30)
Globulin: 2.5 g/dL (calc) (ref 1.9–3.7)
Glucose, Bld: 100 mg/dL — ABNORMAL HIGH (ref 65–99)
Potassium: 4.1 mmol/L (ref 3.5–5.3)
Sodium: 138 mmol/L (ref 135–146)
Total Bilirubin: 0.6 mg/dL (ref 0.2–1.2)
Total Protein: 7 g/dL (ref 6.1–8.1)
eGFR: 93 mL/min/{1.73_m2} (ref >=60)

## 2022-01-25 LAB — MAGNESIUM: Magnesium: 2.1 mg/dL (ref 1.5–2.5)

## 2022-01-25 LAB — CBC
HCT: 47.6 % (ref 38.5–50.0)
Hemoglobin: 16 g/dL (ref 13.2–17.1)
MCH: 28.9 pg (ref 27.0–33.0)
MCHC: 33.6 g/dL (ref 32.0–36.0)
MCV: 86.1 fL (ref 80.0–100.0)
MPV: 10.1 fL (ref 7.5–12.5)
Platelets: 194 10*3/uL (ref 140–400)
RBC: 5.53 10*6/uL (ref 4.20–5.80)
RDW: 12.9 % (ref 11.0–15.0)
WBC: 6.9 10*3/uL (ref 3.8–10.8)

## 2022-01-25 LAB — LIPID PANEL
Cholesterol: 170 mg/dL (ref <200)
HDL: 41 mg/dL (ref >=40)
LDL Cholesterol (Calc): 109 mg/dL (calc) — ABNORMAL HIGH
Non-HDL Cholesterol (Calc): 129 mg/dL (calc) (ref <130)
Total CHOL/HDL Ratio: 4.1 (calc) (ref <5.0)
Triglycerides: 104 mg/dL (ref <150)

## 2022-01-25 LAB — HEPATITIS C ANTIBODY
Hepatitis C Ab: NONREACTIVE
SIGNAL TO CUT-OFF: <0.02 (ref <1.00)

## 2022-01-25 LAB — HIV ANTIBODY (ROUTINE TESTING W REFLEX): HIV 1&2 Ab, 4th Generation: NONREACTIVE

## 2022-01-25 LAB — PSA: PSA: 0.43 ng/mL (ref <=4.00)

## 2022-01-25 LAB — HEMOGLOBIN A1C
Hgb A1c MFr Bld: 5.7 % of total Hgb — ABNORMAL HIGH (ref <5.7)
Mean Plasma Glucose: 117 mg/dL
eAG (mmol/L): 6.5 mmol/L

## 2022-01-25 NOTE — Telephone Encounter (Signed)
I advised him at the appt that this was okay to continue Flexeril ?

## 2022-01-25 NOTE — Telephone Encounter (Signed)
Called and LVM in regards to upcoming appt.Would like to know if patient has ever had a sleep study.  ?

## 2022-01-25 NOTE — Telephone Encounter (Signed)
Pt advised.  He states he would like a refill to get him until his appointment on Monday.  Please send to [facility].  ? ?Thanks,  ? ?-Mickel Baas  ?

## 2022-01-25 NOTE — Telephone Encounter (Signed)
Requested Prescriptions  ?Pending Prescriptions Disp Refills  ?? losartan (COZAAR) 100 MG tablet [Pharmacy Med Name: LOSARTAN POTASSIUM 100 MG TAB] 45 tablet 1  ?  Sig: TAKE 1/2 TABLET BY MOUTH EVERY DAY  ?  ? Cardiovascular:  Angiotensin Receptor Blockers Failed - 01/25/2022  9:30 AM  ?  ?  Failed - Last BP in normal range  ?  BP Readings from Last 1 Encounters:  ?01/24/22 (!) 146/94  ?   ?  ?  Passed - Cr in normal range and within 180 days  ?  Creat  ?Date Value Ref Range Status  ?01/24/2022 0.99 0.70 - 1.30 mg/dL Final  ?   ?  ?  Passed - K in normal range and within 180 days  ?  Potassium  ?Date Value Ref Range Status  ?01/24/2022 4.1 3.5 - 5.3 mmol/L Final  ?   ?  ?  Passed - Patient is not pregnant  ?  ?  Passed - Valid encounter within last 6 months  ?  Recent Outpatient Visits   ?      ? Yesterday Encounter for general adult medical examination with abnormal findings  ? [facility] [place], [place] W, NP  ? 1 month ago Witnessed apneic spells  ? [facility] [place], [place] W, NP  ? 2 months ago Primary hypertension  ? [facility] [place], [place] W, NP  ? 8 months ago Right leg pain  ? [facility] [place], [place] W, NP  ?  ?  ? ?  ?  ?  ?? rOPINIRole (REQUIP) 2 MG tablet [Pharmacy Med Name: ROPINIROLE HCL 2 MG TABLET] 270 tablet 1  ?  Sig: TAKE 1 TABLET BY MOUTH 3 TIMES DAILY.  ?  ? Neurology:  Parkinsonian Agents Failed - 01/25/2022  9:30 AM  ?  ?  Failed - Last BP in normal range  ?  BP Readings from Last 1 Encounters:  ?01/24/22 (!) 146/94  ?   ?  ?  Passed - Last Heart Rate in normal range  ?  Pulse Readings from Last 1 Encounters:  ?01/24/22 84  ?   ?  ?  Passed - Valid encounter within last 12 months  ?  Recent Outpatient Visits   ?      ? Yesterday Encounter for general adult medical examination with abnormal findings  ? [facility] [place], [place] W, NP  ? 1 month ago Witnessed apneic spells  ? [facility] [place],  [place] W, NP  ? 2 months ago Primary hypertension  ? [facility] [place], [place] W, NP  ? 8 months ago Right leg pain  ? [facility] [place], Coralie Keens, NP  ?  ?  ? ?  ?  ?  ? ? ?

## 2022-01-25 NOTE — Patient Instructions (Signed)

## 2022-01-25 NOTE — Telephone Encounter (Signed)
Copied from [place] [id number]. Topic: General - Other ?>> Jan 25, 2022 11:24 AM Pawlus, Jonathan [name] wrote: ?Reason for CRM: Pt was just seen yesterday 3/15, pt wanted to continue on the cyclobenzaprine from [facility] ED and stated he discussed this with Webb Silversmith, please advise. ?

## 2022-01-25 NOTE — Assessment & Plan Note (Signed)
Encouraged diet and exercise for weight loss ?

## 2022-01-26 ENCOUNTER — Encounter: Payer: Self-pay | Admitting: Adult Health

## 2022-01-26 ENCOUNTER — Ambulatory Visit: Payer: Federal, State, Local not specified - PPO | Admitting: Adult Health

## 2022-01-26 ENCOUNTER — Other Ambulatory Visit: Payer: Self-pay

## 2022-01-26 DIAGNOSIS — G2581 Restless legs syndrome: Secondary | ICD-10-CM | POA: Diagnosis not present

## 2022-01-26 DIAGNOSIS — G4719 Other hypersomnia: Secondary | ICD-10-CM

## 2022-01-26 MED ORDER — CYCLOBENZAPRINE HCL 5 MG PO TABS: 5.0000 mg | ORAL_TABLET | Freq: Three times a day (TID) | ORAL | 0 refills | Status: DC | PRN

## 2022-01-26 NOTE — Patient Instructions (Addendum)
Set up for Home sleep study  ?Healthy sleep regimen  ?Work on healthy weight loss.  ?Follow up in 6 weeks to discuss results and go over treatment plan .  ?

## 2022-01-26 NOTE — Progress Notes (Signed)
Reviewed and agree with assessment/plan. ? ? ?Chesley Mires, MD ?[place] ?01/26/2022, 12:38 PM ?Pager:  [phone] ? ?

## 2022-01-26 NOTE — Addendum Note (Signed)
Addended by: Jearld Fenton on: 01/26/2022 11:48 AM ? ? Modules accepted: Orders ? ?

## 2022-01-26 NOTE — Assessment & Plan Note (Signed)
Excessive daytime sleepiness, snoring, restless sleep, gasping for air during sleep, BMI 36 all suspicious for underlying sleep apnea.  We will set patient up for home sleep study.  Patient education on sleep apnea and potential treatment options ? ?- discussed how weight can impact sleep and risk for sleep disordered breathing ?- discussed options to assist with weight loss: combination of diet modification, cardiovascular and strength training exercises ?  ?- had an extensive discussion regarding the adverse health consequences related to untreated sleep disordered breathing ?- specifically discussed the risks for hypertension, coronary artery disease, cardiac dysrhythmias, cerebrovascular disease, and diabetes ?- lifestyle modification discussed ?  ?- discussed how sleep disruption can increase risk of accidents, particularly when driving ?- safe driving practices were discussed ?  ?Plan  ?Patient Instructions  ?Set up for Home sleep study  ?Healthy sleep regimen  ?Work on healthy weight loss.  ?Follow up in 6 weeks to discuss results and go over treatment plan .  ?  ? ?

## 2022-01-26 NOTE — Assessment & Plan Note (Addendum)
Appears controlled on Requip. ?Managed by primary care. ?Could consider iron panel going forward if indicated ?

## 2022-01-26 NOTE — Assessment & Plan Note (Signed)
Continue on healthy weight loss ?

## 2022-01-26 NOTE — Telephone Encounter (Signed)
Flexeril sent to [facility] [place] ?

## 2022-01-26 NOTE — Progress Notes (Signed)
? ?'@Patient'$  ID: Jonathan [name], male    DOB: 01/20/1971, 51 y.o.   MRN: [id number] ? ?Chief Complaint  ?Patient presents with  ? Follow-up  ? ? ?Referring provider: ?Jearld Fenton, NP ? ?HPI: ?51 year old male seen for sleep consult January 26, 2022 for loud snoring, restless sleep, gasping for breath during sleep and daytime sleepiness ? ?TEST/EVENTS :  ? ?01/26/2022 Sleep Consult  ?Patient presents for a sleep consult.  Patient complains of loud snoring, gasping for breath during his sleep, restless sleep, daytime sleepiness for last 15 years. Marland Kitchen  Epworth score is 6.  Gets sleepy with inactivity such as watching TV.  Patient typically goes to bed about 10 PM.  Takes about 30 minutes to go to sleep.  Gets up about 2-3 times each night.  Is up at 6:30 AM.  Does not operate heavy machinery for work.  Has never had a sleep study.  Weight has been trending up over the last 2 years.  Current weight is at 246 pounds with a BMI at 36. ?Caffeine intake 1 soda  ?No symptoms suspicious for cataplexy or sleep paralysis. ?Has had Restless leg syndrome for last 8years , controlled well with Requip. Takes Three times a day  .  ?Had colonoscopy in January noted to have low oxygen levels during procedure, concern for possible sleep apnea.  ?Recently seen by PCP for bronchitis , currently on steroid pack. Feeling better w/ decreased cough.  ? ?Social history.  Patient is married.  Does not have children.  Quit smoking 1994.  Quit drinking alcohol in 2015.  Lives with his spouse at home.  Is a custodian. ? ?Family history positive for breast cancer in his mother.  And prostate cancer in his father ? ?Surgical history none. ? ?Medical history significant for hypertension, hyperlipidemia, ,Anxiety, GERD, restless leg syndrome ? ?Allergies  ?Allergen Reactions  ? Ace Inhibitors   ?  Cough ?  ? ? ?Immunization History  ?Administered Date(s) Administered  ? Moderna Sars-Covid-2 Vaccination 03/07/2020, 03/28/2020, 09/16/2020  ? Td  08/25/2004  ? Tdap 02/27/2016  ? ? ?Past Medical History:  ?Diagnosis Date  ? Anxiety   ? GERD (gastroesophageal reflux disease)   ? Hypertension   ? ? ?Tobacco History: ?Social History  ? ?Tobacco Use  ?Smoking Status Former  ? Types: Cigarettes  ? Quit date: 23  ? Years since quitting: 43.2  ?Smokeless Tobacco Current  ? Types: Chew  ? ?Ready to quit: Not Answered ?Counseling given: Not Answered ? ? ?Outpatient Medications Prior to Visit  ?Medication Sig Dispense Refill  ? albuterol (VENTOLIN HFA) 108 (90 Base) MCG/ACT inhaler Inhale 2 puffs into the lungs every 6 (six) hours as needed. 18 g 1  ? ALPRAZolam (XANAX) 0.25 MG tablet Take 1 tablet (0.25 mg total) by mouth daily as needed for anxiety. (Patient taking differently: Take 0.25 mg by mouth daily as needed for anxiety (Only when he fly).) 10 tablet 0  ? citalopram (CELEXA) 40 MG tablet TAKE 1 TABLET BY MOUTH EVERY DAY 90 tablet 0  ? hydrochlorothiazide (MICROZIDE) 12.5 MG capsule TAKE 1 CAPSULE BY MOUTH EVERY DAY 90 capsule 1  ? losartan (COZAAR) 100 MG tablet TAKE 1/2 TABLET BY MOUTH EVERY DAY 45 tablet 1  ? Multiple Vitamin (MULTIVITAMIN) tablet Take 1 tablet by mouth daily.    ? omeprazole (PRILOSEC) 40 MG capsule TAKE 1 CAPSULE BY MOUTH EVERY DAY 90 capsule 1  ? predniSONE (DELTASONE) 10 MG tablet Take 6 tabs on  day 1, 5 tabs on day 2, 4 tabs on day 3, 3 tabs on day 4, 2 tabs on day 5, 1 tab on day 6 21 tablet 0  ? rOPINIRole (REQUIP) 2 MG tablet TAKE 1 TABLET BY MOUTH 3 TIMES DAILY. 270 tablet 1  ? ?No facility-administered medications prior to visit.  ? ? ? ?Review of Systems:  ? ?Constitutional:   No  weight loss, night sweats,  Fevers, chills,  ?+fatigue, or  lassitude. ? ?HEENT:   No headaches,  Difficulty swallowing,  Tooth/dental problems, or  Sore throat,  ?              No sneezing, itching, ear ache, nasal congestion, post nasal drip,  ? ?CV:  No chest pain,  Orthopnea, PND, swelling in lower extremities, anasarca, dizziness, palpitations,  syncope.  ? ?GI  No heartburn, indigestion, abdominal pain, nausea, vomiting, diarrhea, change in bowel habits, loss of appetite, bloody stools.  ? ?Resp: .  No chest wall deformity ? ?Skin: no rash or lesions. ? ?GU: no dysuria, change in color of urine, no urgency or frequency.  No flank pain, no hematuria  ? ?MS:  No joint pain or swelling.  No decreased range of motion.  No back pain. ? ? ? ?Physical Exam ? ?BP 110/70 (BP Location: Left Arm, Patient Position: Sitting, Cuff Size: Normal)   Pulse 96   Temp (!) 97.5 ?F (36.4 ?C) (Oral)   Ht '5\' 9"'$  (1.753 m)   Wt 246 lb (111.6 kg)   SpO2 96%   BMI 36.33 kg/m?  ? ?GEN: A/Ox3; pleasant , NAD, well nourished  ?  ?HEENT:  [place]/AT,   NOSE-clear, THROAT-clear, no lesions, no postnasal drip or exudate noted. Class 3 MP airway  ? ?NECK:  Supple w/ fair ROM; no JVD; normal carotid impulses w/o bruits; no thyromegaly or nodules palpated; no lymphadenopathy.   ? ?RESP  Clear  P & A; w/o, wheezes/ rales/ or rhonchi. no accessory muscle use, no dullness to percussion ? ?CARD:  RRR, no m/r/g, no peripheral edema, pulses intact, no cyanosis or clubbing. ? ?GI:   Soft & nt; nml bowel sounds; no organomegaly or masses detected.  ? ?Musco: Warm bil, no deformities or joint swelling noted.  ? ?Neuro: alert, no focal deficits noted.   ? ?Skin: Warm, no lesions or rashes ? ? ? ?Lab Results: ? ?CBC ?   ?Component Value Date/Time  ? WBC 6.9 01/24/2022 1027  ? RBC 5.53 01/24/2022 1027  ? HGB 16.0 01/24/2022 1027  ? HCT 47.6 01/24/2022 1027  ? PLT 194 01/24/2022 1027  ? MCV 86.1 01/24/2022 1027  ? MCH 28.9 01/24/2022 1027  ? MCHC 33.6 01/24/2022 1027  ? RDW 12.9 01/24/2022 1027  ? LYMPHSABS 1.9 02/27/2016 1032  ? MONOABS 0.6 02/27/2016 1032  ? EOSABS 0.1 02/27/2016 1032  ? BASOSABS 0.0 02/27/2016 1032  ? ? ?BMET ?   ?Component Value Date/Time  ? NA 138 01/24/2022 1027  ? K 4.1 01/24/2022 1027  ? CL 102 01/24/2022 1027  ? CO2 26 01/24/2022 1027  ? GLUCOSE 100 (H) 01/24/2022 1027  ? BUN 14  01/24/2022 1027  ? CREATININE 0.99 01/24/2022 1027  ? CALCIUM 9.4 01/24/2022 1027  ? GFRNONAA 95.90 09/13/2010 1103  ? ? ?BNP ?No results found for: BNP ? ?ProBNP ?No results found for: PROBNP ? ?Imaging: ?No results found. ? ? ? ?No flowsheet data found. ? ?No results found for: NITRICOXIDE ? ? ? ? ? ?  Assessment & Plan:  ? ?Excessive daytime sleepiness ?Excessive daytime sleepiness, snoring, restless sleep, gasping for air during sleep, BMI 36 all suspicious for underlying sleep apnea.  We will set patient up for home sleep study.  Patient education on sleep apnea and potential treatment options ? ?- discussed how weight can impact sleep and risk for sleep disordered breathing ?- discussed options to assist with weight loss: combination of diet modification, cardiovascular and strength training exercises ?  ?- had an extensive discussion regarding the adverse health consequences related to untreated sleep disordered breathing ?- specifically discussed the risks for hypertension, coronary artery disease, cardiac dysrhythmias, cerebrovascular disease, and diabetes ?- lifestyle modification discussed ?  ?- discussed how sleep disruption can increase risk of accidents, particularly when driving ?- safe driving practices were discussed ?  ?Plan  ?Patient Instructions  ?Set up for Home sleep study  ?Healthy sleep regimen  ?Work on healthy weight loss.  ?Follow up in 6 weeks to discuss results and go over treatment plan .  ?  ? ? ?RLS (restless legs syndrome) ?Appears controlled on Requip. ?Managed by primary care. ?Could consider iron panel going forward if indicated ? ?Morbid obesity ([place]) ?Continue on healthy weight loss ? ? ? ? ?Rexene Edison, NP ?01/26/2022 ? ?

## 2022-01-29 ENCOUNTER — Ambulatory Visit: Payer: Federal, State, Local not specified - PPO | Admitting: Internal Medicine

## 2022-02-19 ENCOUNTER — Other Ambulatory Visit: Payer: Self-pay | Admitting: Internal Medicine

## 2022-02-19 DIAGNOSIS — R0781 Pleurodynia: Secondary | ICD-10-CM | POA: Diagnosis not present

## 2022-02-19 DIAGNOSIS — R059 Cough, unspecified: Secondary | ICD-10-CM | POA: Diagnosis not present

## 2022-02-19 DIAGNOSIS — R0789 Other chest pain: Secondary | ICD-10-CM | POA: Diagnosis not present

## 2022-02-19 DIAGNOSIS — G4733 Obstructive sleep apnea (adult) (pediatric): Secondary | ICD-10-CM | POA: Diagnosis not present

## 2022-02-19 DIAGNOSIS — Z888 Allergy status to other drugs, medicaments and biological substances status: Secondary | ICD-10-CM | POA: Diagnosis not present

## 2022-02-19 DIAGNOSIS — R062 Wheezing: Secondary | ICD-10-CM | POA: Diagnosis not present

## 2022-02-19 DIAGNOSIS — J9811 Atelectasis: Secondary | ICD-10-CM | POA: Diagnosis not present

## 2022-02-19 DIAGNOSIS — I10 Essential (primary) hypertension: Secondary | ICD-10-CM | POA: Diagnosis not present

## 2022-02-19 DIAGNOSIS — Z6835 Body mass index (BMI) 35.0-35.9, adult: Secondary | ICD-10-CM | POA: Diagnosis not present

## 2022-02-19 NOTE — Telephone Encounter (Signed)
Requested Prescriptions  ?Pending Prescriptions Disp Refills  ?? citalopram (CELEXA) 40 MG tablet [Pharmacy Med Name: CITALOPRAM HBR 40 MG TABLET] 90 tablet 1  ?  Sig: TAKE 1 TABLET BY MOUTH EVERY DAY  ?  ? Psychiatry:  Antidepressants - SSRI Passed - 02/19/2022  8:11 AM  ?  ?  Passed - Completed PHQ-2 or PHQ-9 in the last 360 days  ?  ?  Passed - Valid encounter within last 6 months  ?  Recent Outpatient Visits   ?      ? 3 weeks ago Encounter for general adult medical examination with abnormal findings  ? [facility] [place], [place] W, NP  ? 2 months ago Witnessed apneic spells  ? [facility] [place], [place] W, NP  ? 3 months ago Primary hypertension  ? [facility] [place], [place] W, NP  ? 9 months ago Right leg pain  ? [facility] [place], Coralie Keens, NP  ?  ?  ? ?  ?  ?  ? ?

## 2022-02-22 ENCOUNTER — Encounter: Payer: Self-pay | Admitting: Internal Medicine

## 2022-02-22 ENCOUNTER — Ambulatory Visit: Payer: Federal, State, Local not specified - PPO | Admitting: Internal Medicine

## 2022-02-22 VITALS — BP 112/68 | HR 98 | Temp 97.7°F | Wt 245.0 lb

## 2022-02-22 DIAGNOSIS — K760 Fatty (change of) liver, not elsewhere classified: Secondary | ICD-10-CM

## 2022-02-22 DIAGNOSIS — E66812 Obesity, class 2: Secondary | ICD-10-CM

## 2022-02-22 DIAGNOSIS — R1011 Right upper quadrant pain: Secondary | ICD-10-CM | POA: Diagnosis not present

## 2022-02-22 DIAGNOSIS — Z6836 Body mass index (BMI) 36.0-36.9, adult: Secondary | ICD-10-CM

## 2022-02-22 MED ORDER — BACLOFEN 10 MG PO TABS: 10.0000 mg | ORAL_TABLET | Freq: Three times a day (TID) | ORAL | 0 refills | Status: DC | PRN

## 2022-02-22 NOTE — Assessment & Plan Note (Signed)
Encourage diet and exercise for weight loss 

## 2022-02-22 NOTE — Progress Notes (Signed)
? ?Subjective:  ? ? Patient ID: Jonathan [name], male    DOB: [birth year], 51 y.o.   MRN: [id number] ? ?HPI ? ?Patient presents to clinic today for multiple ER follow-up.  He presented to the ER 3/11 with complaint of right upper abdominal pain.  This has been an ongoing issue for 10 years but has gotten worse in the last year.  He was prescribed Cyclobenzaprine and Lidoderm patches, discharged and advised to follow-up with his PCP.  He went back to the ER 4/10 for the same.  Chest x-ray was negative.  Ultrasound of the RUQ did not show any musculoskeletal abnormality.  ECG was normal.  He was advised to take Tylenol, Ibuprofen, Cyclobenzaprine and Lidoderm patches as previously prescribed.  He was also given Albuterol and Tussionex for wheezing and cough.  He was offered Prednisone but he declined this.  He was discharged advised to follow-up with PCP and pulmonology.  Since that time, he reports persistent RUQ abdominal pain.  He describes this pain as sharp, stabbing and spasms.  The pain is worse with movements.  He has not noticed any rash to the area.  He denies nausea, vomiting, reflux, constipation, diarrhea or blood in his stool.  He denies urinary urgency, frequency, dysuria, blood in his urine, penile lesion, penile discharge, testicular pain or swelling.  He has known fatty liver but has normal liver enzymes.  RUQ ultrasound and CT abdomen pelvis reviewed in Epic.  He no longer drinks alcohol. ? ?Review of Systems ? ?   ?Past Medical History:  ?Diagnosis Date  ? Anxiety   ? GERD (gastroesophageal reflux disease)   ? Hypertension   ? ? ?Current Outpatient Medications  ?Medication Sig Dispense Refill  ? albuterol (VENTOLIN HFA) 108 (90 Base) MCG/ACT inhaler Inhale 2 puffs into the lungs every 6 (six) hours as needed. 18 g 1  ? ALPRAZolam (XANAX) 0.25 MG tablet Take 1 tablet (0.25 mg total) by mouth daily as needed for anxiety. (Patient taking differently: Take 0.25 mg by mouth daily as needed for  anxiety (Only when he fly).) 10 tablet 0  ? citalopram (CELEXA) 40 MG tablet TAKE 1 TABLET BY MOUTH EVERY DAY 90 tablet 1  ? cyclobenzaprine (FLEXERIL) 5 MG tablet Take 1 tablet (5 mg total) by mouth 3 (three) times daily as needed for muscle spasms. 15 tablet 0  ? hydrochlorothiazide (MICROZIDE) 12.5 MG capsule TAKE 1 CAPSULE BY MOUTH EVERY DAY 90 capsule 1  ? losartan (COZAAR) 100 MG tablet TAKE 1/2 TABLET BY MOUTH EVERY DAY 45 tablet 1  ? Multiple Vitamin (MULTIVITAMIN) tablet Take 1 tablet by mouth daily.    ? omeprazole (PRILOSEC) 40 MG capsule TAKE 1 CAPSULE BY MOUTH EVERY DAY 90 capsule 1  ? predniSONE (DELTASONE) 10 MG tablet Take 6 tabs on day 1, 5 tabs on day 2, 4 tabs on day 3, 3 tabs on day 4, 2 tabs on day 5, 1 tab on day 6 21 tablet 0  ? rOPINIRole (REQUIP) 2 MG tablet TAKE 1 TABLET BY MOUTH 3 TIMES DAILY. 270 tablet 1  ? ?No current facility-administered medications for this visit.  ? ? ?Allergies  ?Allergen Reactions  ? Ace Inhibitors   ?  Cough ?  ? ? ?Family History  ?Problem Relation Age of Onset  ? Breast cancer Mother   ? Hypertension Mother   ? Prostate cancer Father   ? ? ?Social History  ? ?Socioeconomic History  ? Marital status: Married  ?  Spouse name: Not on file  ? Number of children: 0  ? Years of education: Not on file  ? Highest education level: Not on file  ?Occupational History  ? Not on file  ?Tobacco Use  ? Smoking status: Former  ?  Types: Cigarettes  ?  Quit date: 62  ?  Years since quitting: 43.3  ? Smokeless tobacco: Current  ?  Types: Chew  ?Vaping Use  ? Vaping Use: Never used  ?Substance and Sexual Activity  ? Alcohol use: No  ?  Alcohol/week: 0.0 standard drinks  ? Drug use: Never  ? Sexual activity: Not on file  ?Other Topics Concern  ? Not on file  ?Social History Narrative  ? Not on file  ? ?Social Determinants of Health  ? ?Financial Resource Strain: Not on file  ?Food Insecurity: Not on file  ?Transportation Needs: Not on file  ?Physical Activity: Not on file   ?Stress: Not on file  ?Social Connections: Not on file  ?Intimate Partner Violence: Not on file  ? ? ? ?Constitutional: Denies fever, malaise, fatigue, headache or abrupt weight changes.  ?Respiratory: Denies difficulty breathing, shortness of breath, cough or sputum production.   ?Cardiovascular: Denies chest pain, chest tightness, palpitations or swelling in the hands or feet.  ?Gastrointestinal: Patient reports RUQ abdominal pain.  Denies bloating, constipation, diarrhea or blood in the stool.  ?GU: Denies urgency, frequency, pain with urination, burning sensation, blood in urine, odor or discharge. ?Musculoskeletal: Denies decrease in range of motion, difficulty with gait, muscle pain or joint pain and swelling.  ?Skin: Denies redness, rashes, lesions or ulcercations.  ? ?No other specific complaints in a complete review of systems (except as listed in HPI above). ? ?Objective:  ? Physical Exam ? ?BP 112/68 (BP Location: Left Arm, Patient Position: Sitting, Cuff Size: Large)   Pulse 98   Temp 97.7 ?F (36.5 ?C) (Temporal)   Wt 245 lb (111.1 kg)   SpO2 97%   BMI 36.18 kg/m?  ? ?Wt Readings from Last 3 Encounters:  ?01/26/22 246 lb (111.6 kg)  ?01/24/22 251 lb (113.9 kg)  ?12/13/21 250 lb (113.4 kg)  ? ? ?General: Appears his stated age, obese, in NAD. ?Skin: Warm, dry and intact. No rashes noted. ?Cardiovascular: Normal rate and rhythm. S1,S2 noted.  No murmur, rubs or gallops noted.  ?Pulmonary/Chest: Normal effort and positive vesicular breath sounds. No respiratory distress. No wheezes, rales or ronchi noted.  ?Abdomen: Soft and tender with palpation in the RUQ.  Negative Murphy sign.  Normal bowel sounds. No distention or masses noted. Liver, spleen and kidneys non palpable. ?Musculoskeletal: No pain with palpation of the ribs on the right side.  No difficulty with gait.  ?Neurological: Alert and oriented. ?Psychiatric: Mildly anxious appearing. ? ?BMET ?   ?Component Value Date/Time  ? NA 138 01/24/2022  1027  ? K 4.1 01/24/2022 1027  ? CL 102 01/24/2022 1027  ? CO2 26 01/24/2022 1027  ? GLUCOSE 100 (H) 01/24/2022 1027  ? BUN 14 01/24/2022 1027  ? CREATININE 0.99 01/24/2022 1027  ? CALCIUM 9.4 01/24/2022 1027  ? GFRNONAA 95.90 09/13/2010 1103  ? ? ?Lipid Panel  ?   ?Component Value Date/Time  ? CHOL 170 01/24/2022 1027  ? TRIG 104 01/24/2022 1027  ? HDL 41 01/24/2022 1027  ? CHOLHDL 4.1 01/24/2022 1027  ? VLDL 51.4 (H) 01/31/2021 1035  ? LDLCALC 109 (H) 01/24/2022 1027  ? ? ?CBC ?   ?Component Value Date/Time  ?  WBC 6.9 01/24/2022 1027  ? RBC 5.53 01/24/2022 1027  ? HGB 16.0 01/24/2022 1027  ? HCT 47.6 01/24/2022 1027  ? PLT 194 01/24/2022 1027  ? MCV 86.1 01/24/2022 1027  ? MCH 28.9 01/24/2022 1027  ? MCHC 33.6 01/24/2022 1027  ? RDW 12.9 01/24/2022 1027  ? LYMPHSABS 1.9 02/27/2016 1032  ? MONOABS 0.6 02/27/2016 1032  ? EOSABS 0.1 02/27/2016 1032  ? BASOSABS 0.0 02/27/2016 1032  ? ? ?Hgb A1C ?Lab Results  ?Component Value Date  ? HGBA1C 5.7 (H) 01/24/2022  ? ? ? ? ? ? ?   ?Assessment & Plan:  ? ?Chronic RUQ Abdominal Pain, Fatty Liver: ? ?ER notes, labs and imaging reviewed ?Will obtain MRI abdomen for further evaluation of symptoms ?Stop Cyclobenzaprine ?Continue Lidoderm patches ?Rx for Baclofen 10 mg 3 times daily as needed ? ?We will follow-up after imaging with further recommendation and treatment plan ? ?Webb Silversmith, NP ? ?

## 2022-02-22 NOTE — Patient Instructions (Signed)
Fatty Liver Disease  The liver converts food into energy, removes toxic material from the blood, makes important proteins, and absorbs necessary vitamins from food. Fatty liver disease occurs when too much fat has built up in your liver cells. Fatty liverdisease is also called hepatic steatosis. In many cases, fatty liver disease does not cause symptoms or problems. It is often diagnosed when tests are being done for other reasons. However, over time, fatty liver can cause inflammation that may lead to more serious liver problems, such as scarring of the liver (cirrhosis) and liver failure. Fatty liver is associated with insulin resistance, increased body fat, high blood pressure (hypertension), and high cholesterol. These are features of metabolic syndrome and increaseyour risk for stroke, diabetes, and heart disease. What are the causes? This condition may be caused by components of metabolic syndrome: Obesity. Insulin resistance. High cholesterol. Other causes: Alcohol abuse. Poor nutrition. Cushing syndrome. Pregnancy. Certain drugs. Poisons. Some viral infections. What increases the risk? You are more likely to develop this condition if you: Abuse alcohol. Are overweight. Have diabetes. Have hepatitis. Have a high triglyceride level. Are pregnant. What are the signs or symptoms? Fatty liver disease often does not cause symptoms. If symptoms do develop, they can include: Fatigue and weakness. Weight loss. Confusion. Nausea, vomiting, or abdominal pain. Yellowing of your skin and the white parts of your eyes (jaundice). Itchy skin. How is this diagnosed? This condition may be diagnosed by: A physical exam and your medical history. Blood tests. Imaging tests, such as an ultrasound, CT scan, or MRI. A liver biopsy. A small sample of liver tissue is removed using a needle. The sample is then looked at under a microscope. How is this treated? Fatty liver disease is often  caused by other health conditions. Treatment for fatty liver may involve medicines and lifestyle changes to manage conditions such as: Alcoholism. High cholesterol. Diabetes. Being overweight or obese. Follow these instructions at home:  Do not drink alcohol. If you have trouble quitting, ask your health care provider how to safely quit with the help of medicine or a supervised program. This is important to keep your condition from getting worse. Eat a healthy diet as told by your health care provider. Ask your health care provider about working with a dietitian to develop an eating plan. Exercise regularly. This can help you lose weight and control your cholesterol and diabetes. Talk to your health care provider about an exercise plan and which activities are best for you. Take over-the-counter and prescription medicines only as told by your health care provider. Keep all follow-up visits. This is important. Contact a health care provider if: You have trouble controlling your: Blood sugar. This is especially important if you have diabetes. Cholesterol. Drinking of alcohol. Get help right away if: You have abdominal pain. You have jaundice. You have nausea and are vomiting. You vomit blood or material that looks like coffee grounds. You have stools that are black, tar-like, or bloody. Summary Fatty liver disease develops when too much fat builds up in the cells of your liver. Fatty liver disease often causes no symptoms or problems. However, over time, fatty liver can cause inflammation that may lead to more serious liver problems, such as scarring of the liver (cirrhosis). You are more likely to develop this condition if you abuse alcohol, are pregnant, are overweight, have diabetes, have hepatitis, or have high triglyceride or cholesterol levels. Contact your health care provider if you have trouble controlling your blood sugar, cholesterol,   or drinking of alcohol. This information is  not intended to replace advice given to you by your health care provider. Make sure you discuss any questions you have with your healthcare provider. Document Revised: 08/11/2020 Document Reviewed: 08/11/2020 Elsevier Patient Education  2022 Elsevier Inc.  

## 2022-02-26 ENCOUNTER — Ambulatory Visit: Payer: Federal, State, Local not specified - PPO | Admitting: Adult Health

## 2022-02-26 DIAGNOSIS — G2581 Restless legs syndrome: Secondary | ICD-10-CM

## 2022-02-26 DIAGNOSIS — G4733 Obstructive sleep apnea (adult) (pediatric): Secondary | ICD-10-CM

## 2022-02-26 DIAGNOSIS — G4719 Other hypersomnia: Secondary | ICD-10-CM

## 2022-02-26 IMAGING — DX DG FOOT COMPLETE 3+V*R*
3 series · 3 of 3 positions shown · non-contrast
Comparison: None.

CLINICAL DATA: Right heel pain for months.

EXAM:
RIGHT FOOT COMPLETE - 3+ VIEW

[foot ap]
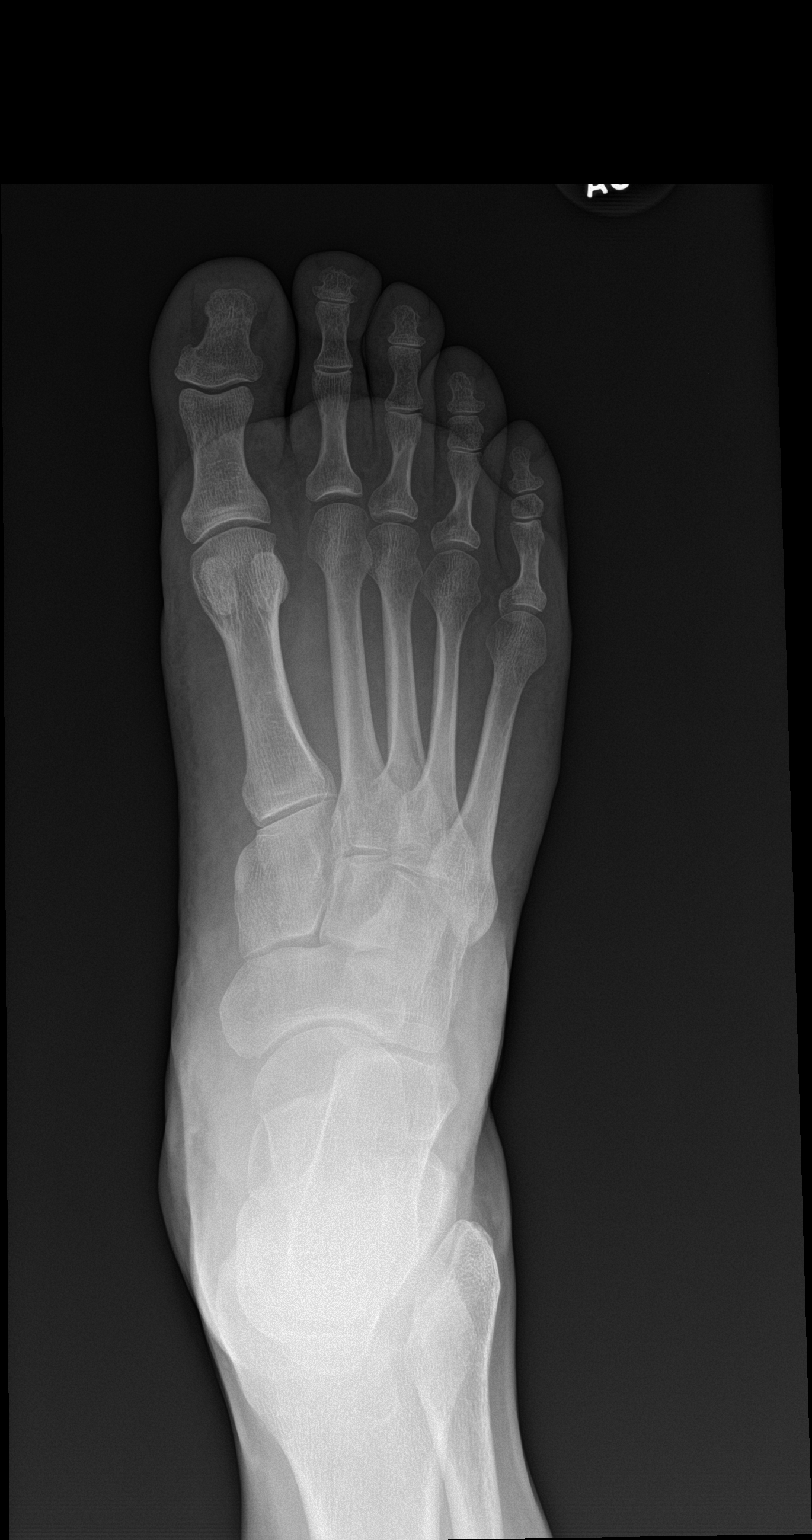

[foot obl]
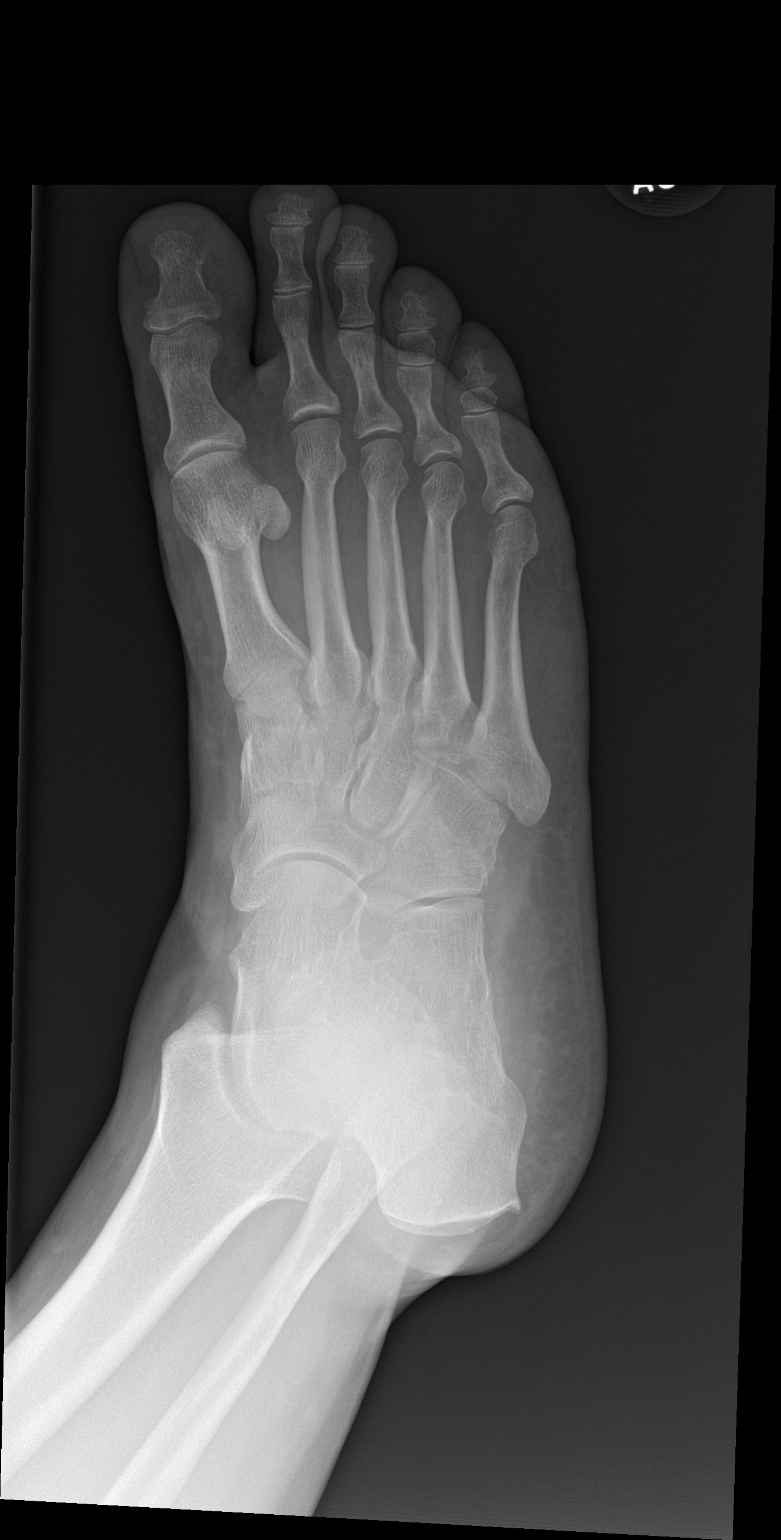

[foot lat]
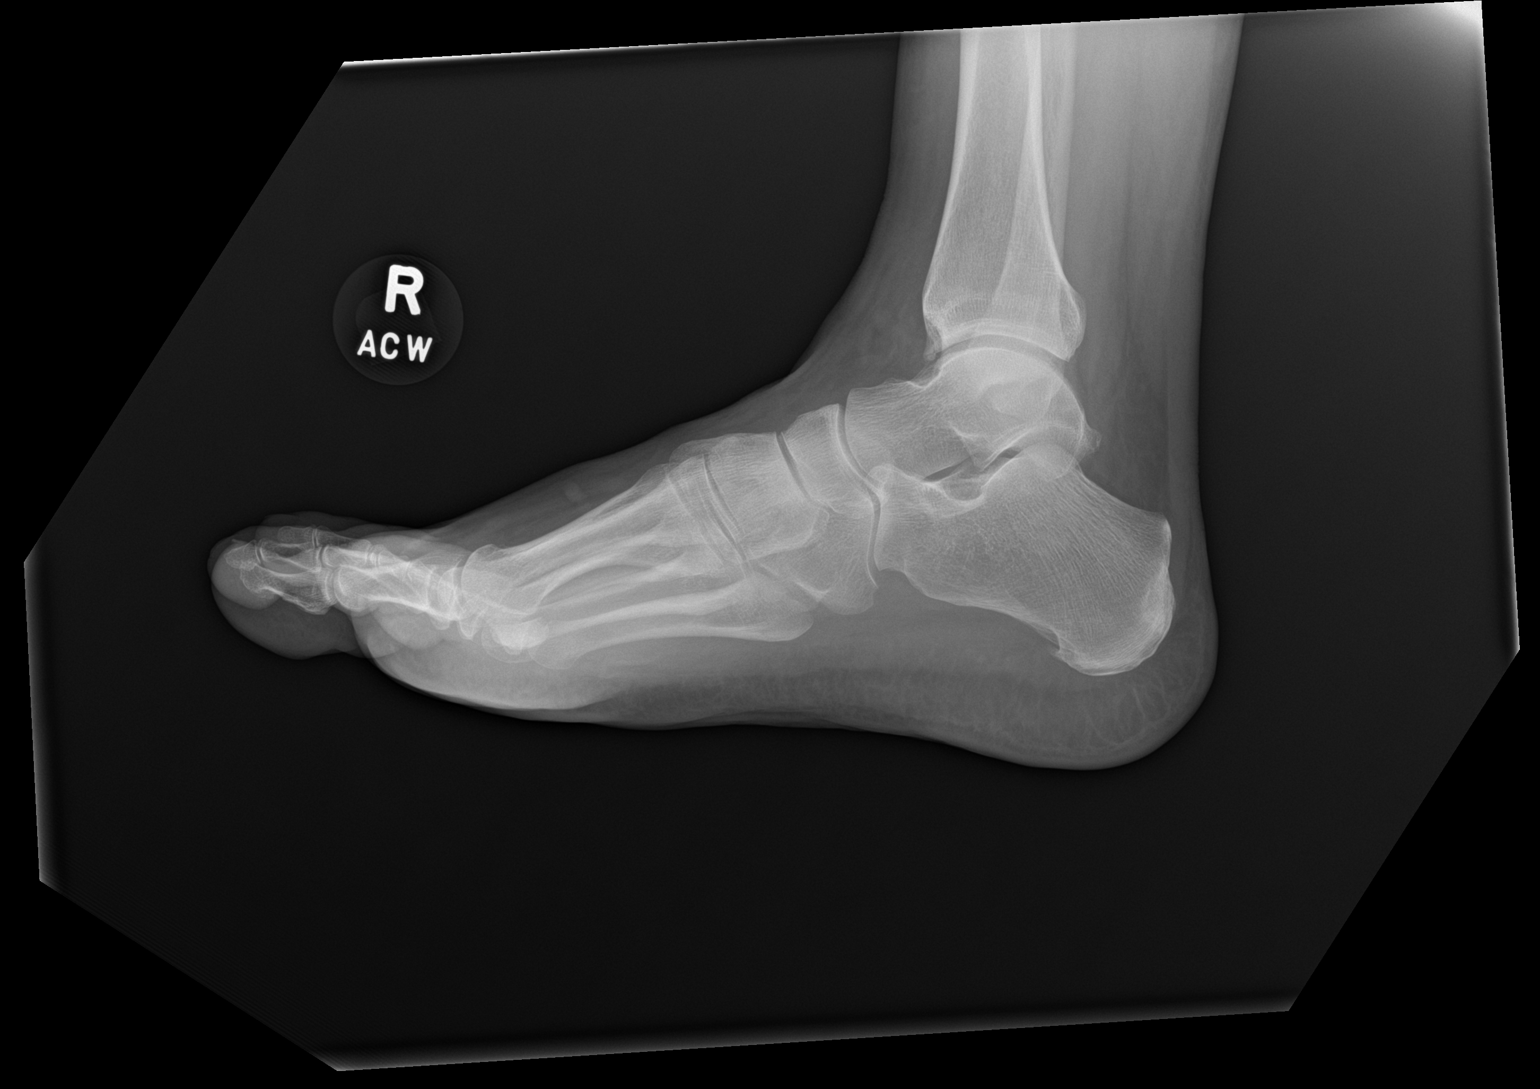

[3 of 3 positions shown; findings below may reference images not displayed]

FINDINGS: Moderate anterior talotibial spur formation. Small inferior
calcaneal enthesophyte. Otherwise, unremarkable bones and soft
tissues.
IMPRESSION: 1. Moderate anterior talotibial degenerative changes.
2. Small inferior calcaneal enthesophyte.

## 2022-03-01 DIAGNOSIS — G4733 Obstructive sleep apnea (adult) (pediatric): Secondary | ICD-10-CM | POA: Diagnosis not present

## 2022-03-07 ENCOUNTER — Ambulatory Visit
Admission: RE | Admit: 2022-03-07 | Discharge: 2022-03-07 | Disposition: A | Discharge status: Home / Self Care | Payer: Federal, State, Local not specified - PPO | Source: Ambulatory Visit | Attending: Internal Medicine | Admitting: Internal Medicine

## 2022-03-07 DIAGNOSIS — R1011 Right upper quadrant pain: Secondary | ICD-10-CM | POA: Diagnosis not present

## 2022-03-07 DIAGNOSIS — K760 Fatty (change of) liver, not elsewhere classified: Secondary | ICD-10-CM | POA: Insufficient documentation

## 2022-03-07 DIAGNOSIS — G8929 Other chronic pain: Secondary | ICD-10-CM | POA: Diagnosis not present

## 2022-03-07 MED ORDER — GADOBUTROL 1 MMOL/ML IV SOLN
10.0000 mL | Freq: Once | INTRAVENOUS | Status: AC | PRN
  Administered 2022-03-07: 10 mL via INTRAVENOUS

## 2022-03-08 ENCOUNTER — Other Ambulatory Visit: Payer: Self-pay | Admitting: Internal Medicine

## 2022-03-09 ENCOUNTER — Telehealth: Payer: Self-pay | Admitting: Adult Health

## 2022-03-09 NOTE — Progress Notes (Addendum)
ATC x1.  Left detailed VM (DPR) to return call.

## 2022-03-09 NOTE — Telephone Encounter (Signed)
Requested Prescriptions  ?Pending Prescriptions Disp Refills  ?? albuterol (VENTOLIN HFA) 108 (90 Base) MCG/ACT inhaler [Pharmacy Med Name: ALBUTEROL HFA (VENTOLIN) INH] 18 each 1  ?  Sig: TAKE 2 PUFFS BY MOUTH EVERY 6 HOURS AS NEEDED  ?  ? Pulmonology:  Beta Agonists 2 Passed - 03/08/2022  3:51 PM  ?  ?  Passed - Last BP in normal range  ?  BP Readings from Last 1 Encounters:  ?02/22/22 112/68  ?   ?  ?  Passed - Last Heart Rate in normal range  ?  Pulse Readings from Last 1 Encounters:  ?02/22/22 98  ?   ?  ?  Passed - Valid encounter within last 12 months  ?  Recent Outpatient Visits   ?      ? 2 weeks ago RUQ abdominal pain  ? [facility] [place], Coralie Keens, NP  ? 1 month ago Encounter for general adult medical examination with abnormal findings  ? [facility] [place], [place] W, NP  ? 2 months ago Witnessed apneic spells  ? [facility] [place], [place] W, NP  ? 3 months ago Primary hypertension  ? [facility] [place], [place] W, NP  ? 9 months ago Right leg pain  ? [facility] [place], Coralie Keens, NP  ?  ?  ? ?  ?  ?  ? ? ?

## 2022-03-09 NOTE — Telephone Encounter (Signed)
Sleep study shows severe sleep apnea with AHI at 62.8/hour and SPO2 low at 79% please set up office visit to discuss test results and treatment plan ?

## 2022-03-09 NOTE — Telephone Encounter (Signed)
-----   Message from Tana [place] sent at 03/01/2022  2:40 PM EDT ----- ?HST Results ?

## 2022-03-12 NOTE — Telephone Encounter (Signed)
Duplicate, see result note. ?

## 2022-03-12 NOTE — Progress Notes (Signed)
ATC x2.  LVM to return call.

## 2022-03-13 ENCOUNTER — Telehealth: Payer: Self-pay | Admitting: *Deleted

## 2022-03-13 ENCOUNTER — Encounter: Payer: Self-pay | Admitting: *Deleted

## 2022-03-13 NOTE — Progress Notes (Addendum)
Unable to reach letter sent to patient.  Encounter closed per policy.

## 2022-03-16 ENCOUNTER — Ambulatory Visit: Payer: Federal, State, Local not specified - PPO | Admitting: Adult Health

## 2022-03-16 ENCOUNTER — Encounter: Payer: Self-pay | Admitting: *Deleted

## 2022-03-16 ENCOUNTER — Encounter: Payer: Self-pay | Admitting: Adult Health

## 2022-03-16 DIAGNOSIS — J310 Chronic rhinitis: Secondary | ICD-10-CM | POA: Insufficient documentation

## 2022-03-16 DIAGNOSIS — J410 Simple chronic bronchitis: Secondary | ICD-10-CM | POA: Diagnosis not present

## 2022-03-16 DIAGNOSIS — G4733 Obstructive sleep apnea (adult) (pediatric): Secondary | ICD-10-CM | POA: Diagnosis not present

## 2022-03-16 DIAGNOSIS — J42 Unspecified chronic bronchitis: Secondary | ICD-10-CM | POA: Insufficient documentation

## 2022-03-16 NOTE — Assessment & Plan Note (Signed)
Severe obstructive sleep apnea with sleep study showing AHI at 62.8/hour and SPO2 low at 77%.  Patient education was given on sleep apnea.  We went over treatment options including weight loss.  And CPAP therapy.  Patient will begin CPAP AutoSet 5 to 20 cm H2O. ? ?- discussed how weight can impact sleep and risk for sleep disordered breathing ?- discussed options to assist with weight loss: combination of diet modification, cardiovascular and strength training exercises ?  ?- had an extensive discussion regarding the adverse health consequences related to untreated sleep disordered breathing ?- specifically discussed the risks for hypertension, coronary artery disease, cardiac dysrhythmias, cerebrovascular disease, and diabetes ?- lifestyle modification discussed ?  ?- discussed how sleep disruption can increase risk of accidents, particularly when driving ?- safe driving practices were discussed ?  ?Plan  ?Patient Instructions  ?Begin CPAP at bedtime. ?Goal is to wear CPAP all night long. ?Work on healthy weight loss ?Do not drive if sleepy ?Saline nasal spray Twice daily   ?Saline nasal gel At bedtime   ?Begin Zyrtec '10mg'$  At bedtime  for 2 weeks and then As needed  drainage  ?Delsym 2 tsp Twice daily  for 5 days and As needed  cough  ?Ventolin inhaler As needed   ?Continue on Omeprazole '20mg'$  daily  ?Add Pepcid '20mg'$  At bedtime   ?Gas x with meals and As needed   ?Follow-up in 2 months with PFT and As needed   ?Please contact office for sooner follow up if symptoms do not improve or worsen or seek emergency care  ? ? ?  ? ?

## 2022-03-16 NOTE — Assessment & Plan Note (Signed)
Chronic bronchitis symptoms since January 2023.  Recent chest x-ray with no acute process.  Will treat for lingering cough and postnasal drainage.  Set up for PFTs and [place]  on return.  Treat for potential triggers such as GERD. ? ?Plan  ?Patient Instructions  ?Begin CPAP at bedtime. ?Goal is to wear CPAP all night long. ?Work on healthy weight loss ?Do not drive if sleepy ?Saline nasal spray Twice daily   ?Saline nasal gel At bedtime   ?Begin Zyrtec '10mg'$  At bedtime  for 2 weeks and then As needed  drainage  ?Delsym 2 tsp Twice daily  for 5 days and As needed  cough  ?Ventolin inhaler As needed   ?Continue on Omeprazole '20mg'$  daily  ?Add Pepcid '20mg'$  At bedtime   ?Gas x with meals and As needed   ?Follow-up in 2 months with PFT and As needed   ?Please contact office for sooner follow up if symptoms do not improve or worsen or seek emergency care  ? ? ?  ? ? ?

## 2022-03-16 NOTE — Assessment & Plan Note (Signed)
Add Zyrtec.  Add in saline nasal rinses. ? ?Plan  ?Patient Instructions  ?Begin CPAP at bedtime. ?Goal is to wear CPAP all night long. ?Work on healthy weight loss ?Do not drive if sleepy ?Saline nasal spray Twice daily   ?Saline nasal gel At bedtime   ?Begin Zyrtec '10mg'$  At bedtime  for 2 weeks and then As needed  drainage  ?Delsym 2 tsp Twice daily  for 5 days and As needed  cough  ?Ventolin inhaler As needed   ?Continue on Omeprazole '20mg'$  daily  ?Add Pepcid '20mg'$  At bedtime   ?Gas x with meals and As needed   ?Follow-up in 2 months with PFT and As needed   ?Please contact office for sooner follow up if symptoms do not improve or worsen or seek emergency care  ? ? ?  ? ?

## 2022-03-16 NOTE — Progress Notes (Signed)
? ?'@Patient'$  ID: Jonathan [name], male    DOB: 03/29/1971, 51 y.o.   MRN: [id number] ? ?Chief Complaint  ?Patient presents with  ? Follow-up  ? ? ?Referring provider: ?Jearld Fenton, NP ? ?HPI: ?51 year old male seen for sleep consult January 26, 2022 found to have severe sleep apnea ? ?TEST/EVENTS :  ?02/26/22 [facility] Sleep study shows severe sleep apnea with AHI at 62.8/hour and SPO2 low at 79%  ? ?03/16/2022 Follow up : OSA ?Patient presents for a 6-week follow-up.  Patient was seen last visit for sleep consult.  Patient had daytime sleepiness snoring and restless sleep.  He was set up for home sleep study that was completed on February 26, 2022.  This showed very severe sleep apnea with AHI at 62.8/hour and SPO2 low at 79%.  He discussed his sleep study results.  Went over treatment options including weight loss.  And recommendations for CPAP therapy.  Patient education was given on sleep apnea and CPAP usage ? ?Patient complains that he had bronchitis in January with cough congestion.  Has had a lingering cough that is just now starting to improve over the last 4 months.  Patient said his wife had COVID during this time.  However he tested negative but felt that he had it.  He has had ongoing soreness in the right lateral chest.  Recent chest x-ray February 19, 2022 on [facility] showed left lower lobe atelectasis./Scarring.  Patient says he continues to have a lot of sinus drainage intermittent reflux.  He is also being evaluated for muscle pains in his abdomen and back.  MR abdomen with no acute process.  Notable hepatic steatosis.  Patient is a never smoker.  Denies any unintentional weight loss.  Patient does have a albuterol inhaler.  Is concerned that he may have some asthma. ? ? ?Allergies  ?Allergen Reactions  ? Ace Inhibitors   ?  Cough ?  ? ? ?Immunization History  ?Administered Date(s) Administered  ? Moderna Sars-Covid-2 Vaccination 03/07/2020, 03/28/2020, 09/16/2020  ? Td 08/25/2004  ? Tdap 02/27/2016   ? ? ?Past Medical History:  ?Diagnosis Date  ? Anxiety   ? GERD (gastroesophageal reflux disease)   ? Hypertension   ? ? ?Tobacco History: ?Social History  ? ?Tobacco Use  ?Smoking Status Former  ? Types: Cigarettes  ? Quit date: 6  ? Years since quitting: 43.3  ?Smokeless Tobacco Current  ? Types: Chew  ? ?Ready to quit: Not Answered ?Counseling given: Not Answered ? ? ?Outpatient Medications Prior to Visit  ?Medication Sig Dispense Refill  ? albuterol (VENTOLIN HFA) 108 (90 Base) MCG/ACT inhaler TAKE 2 PUFFS BY MOUTH EVERY 6 HOURS AS NEEDED 18 each 1  ? baclofen (LIORESAL) 10 MG tablet Take 1 tablet (10 mg total) by mouth 3 (three) times daily as needed for muscle spasms. 30 each 0  ? citalopram (CELEXA) 40 MG tablet TAKE 1 TABLET BY MOUTH EVERY DAY 90 tablet 1  ? hydrochlorothiazide (MICROZIDE) 12.5 MG capsule TAKE 1 CAPSULE BY MOUTH EVERY DAY 90 capsule 1  ? losartan (COZAAR) 100 MG tablet TAKE 1/2 TABLET BY MOUTH EVERY DAY 45 tablet 1  ? Multiple Vitamin (MULTIVITAMIN) tablet Take 1 tablet by mouth daily.    ? omeprazole (PRILOSEC) 40 MG capsule TAKE 1 CAPSULE BY MOUTH EVERY DAY 90 capsule 1  ? rOPINIRole (REQUIP) 2 MG tablet TAKE 1 TABLET BY MOUTH 3 TIMES DAILY. 270 tablet 1  ? ALPRAZolam (XANAX) 0.25 MG tablet Take 1  tablet (0.25 mg total) by mouth daily as needed for anxiety. (Patient not taking: Reported on 03/16/2022) 10 tablet 0  ? ?No facility-administered medications prior to visit.  ? ? ? ?Review of Systems:  ? ?Constitutional:   No  weight loss, night sweats,  Fevers, chills, fatigue, or  lassitude. ? ?HEENT:   No headaches,  Difficulty swallowing,  Tooth/dental problems, or  Sore throat,  ?              No sneezing, itching, ear ache,  ?+nasal congestion, post nasal drip,  ? ?CV:  No chest pain,  Orthopnea, PND, swelling in lower extremities, anasarca, dizziness, palpitations, syncope.  ? ?GI  No heartburn, indigestion, abdominal pain, nausea, vomiting, diarrhea, change in bowel habits, loss of  appetite, bloody stools.  ? ?Resp: .  No chest wall deformity ? ?Skin: no rash or lesions. ? ?GU: no dysuria, change in color of urine, no urgency or frequency.  No flank pain, no hematuria  ? ?MS:  No joint pain or swelling.  No decreased range of motion.  No back pain. ? ? ? ?Physical Exam ? ?BP 120/80 (BP Location: Left Arm, Patient Position: Sitting, Cuff Size: Large)   Pulse 88   Temp 97.9 ?F (36.6 ?C) (Oral)   Ht '5\' 9"'$  (1.753 m)   Wt 251 lb 3.2 oz (113.9 kg)   SpO2 93%   BMI 37.10 kg/m?  ? ?GEN: A/Ox3; pleasant , NAD, well nourished  ?  ?HEENT:  [place]/AT,   NOSE-clear, THROAT-clear, no lesions, no postnasal drip or exudate noted.  ?Class 4 MP airway  ? ?NECK:  Supple w/ fair ROM; no JVD; normal carotid impulses w/o bruits; no thyromegaly or nodules palpated; no lymphadenopathy.   ? ?RESP  Clear  P & A; w/o, wheezes/ rales/ or rhonchi. no accessory muscle use, no dullness to percussion ? ?CARD:  RRR, no m/r/g, no peripheral edema, pulses intact, no cyanosis or clubbing. ? ?GI:   Soft & nt; nml bowel sounds; no organomegaly or masses detected.  ? ?Musco: Warm bil, no deformities or joint swelling noted.  ? ?Neuro: alert, no focal deficits noted.   ? ?Skin: Warm, no lesions or rashes ? ? ? ?Lab Results: ? ?CBC ? ? ?BNP ?No results found for: BNP ? ?ProBNP ?No results found for: PROBNP ? ?Imaging: ?MR Abdomen W Wo Contrast ? ?Result Date: 03/08/2022 ?CLINICAL DATA:  51 year old male with history of chronic right upper quadrant abdominal pain intermittently for the past 10 years. EXAM: MRI ABDOMEN WITHOUT AND WITH CONTRAST TECHNIQUE: Multiplanar multisequence MR imaging of the abdomen was performed both before and after the administration of intravenous contrast. CONTRAST:  57m GADAVIST GADOBUTROL 1 MMOL/ML IV SOLN COMPARISON:  No prior abdominal MRI. CT the abdomen and pelvis 08/20/2013. FINDINGS: Lower chest: Unremarkable. Hepatobiliary: Mild diffuse loss of signal intensity throughout the hepatic parenchyma  on out of phase dual echo images, indicative of a background of mild hepatic steatosis. No suspicious cystic or solid hepatic lesions. No intra or extrahepatic biliary ductal dilatation. Gallbladder is normal in appearance. Pancreas: No pancreatic mass. No pancreatic ductal dilatation. No pancreatic or peripancreatic fluid collections or inflammatory changes. Spleen:  Unremarkable. Adrenals/Urinary Tract: Bilateral kidneys and bilateral adrenal glands are normal in appearance. No hydroureteronephrosis in the visualized portions of the abdomen. Stomach/Bowel: Visualized portions are unremarkable. Vascular/Lymphatic: No aneurysm identified in the visualized abdominal vasculature. No lymphadenopathy noted in the abdomen. Other: No significant volume of ascites noted in the visualized portions  of the peritoneal cavity. Musculoskeletal: No aggressive appearing osseous lesions are noted in the visualized portions of the skeleton. IMPRESSION: 1. No acute findings are noted in the abdomen to account for the patient's symptoms. 2. Hepatic steatosis. Electronically Signed   By: Vinnie Langton M.D.   On: 03/08/2022 07:08   ? ? ? ?   ? View : No data to display.  ?  ?  ?  ? ? ?No results found for: NITRICOXIDE ? ? ? ? ? ?Assessment & Plan:  ? ?OSA (obstructive sleep apnea) ?Severe obstructive sleep apnea with sleep study showing AHI at 62.8/hour and SPO2 low at 77%.  Patient education was given on sleep apnea.  We went over treatment options including weight loss.  And CPAP therapy.  Patient will begin CPAP AutoSet 5 to 20 cm H2O. ? ?- discussed how weight can impact sleep and risk for sleep disordered breathing ?- discussed options to assist with weight loss: combination of diet modification, cardiovascular and strength training exercises ?  ?- had an extensive discussion regarding the adverse health consequences related to untreated sleep disordered breathing ?- specifically discussed the risks for hypertension, coronary  artery disease, cardiac dysrhythmias, cerebrovascular disease, and diabetes ?- lifestyle modification discussed ?  ?- discussed how sleep disruption can increase risk of accidents, particularly when driving ?- saf

## 2022-03-16 NOTE — Addendum Note (Signed)
Addended by: Vanessa Barbara on: 03/16/2022 12:34 PM ? ? Modules accepted: Orders ? ?

## 2022-03-16 NOTE — Patient Instructions (Addendum)
Begin CPAP at bedtime. ?Goal is to wear CPAP all night long. ?Work on healthy weight loss ?Do not drive if sleepy ?Saline nasal spray Twice daily   ?Saline nasal gel At bedtime   ?Begin Zyrtec '10mg'$  At bedtime  for 2 weeks and then As needed  drainage  ?Delsym 2 tsp Twice daily  for 5 days and As needed  cough  ?Ventolin inhaler As needed   ?Continue on Omeprazole '20mg'$  daily  ?Add Pepcid '20mg'$  At bedtime   ?Gas x with meals and As needed   ?Follow-up in 2 months with PFT and As needed   ?Please contact office for sooner follow up if symptoms do not improve or worsen or seek emergency care  ? ? ?

## 2022-03-16 NOTE — Assessment & Plan Note (Signed)
Healthy weight loss 

## 2022-03-26 NOTE — Telephone Encounter (Signed)
Entered in error

## 2022-03-28 ENCOUNTER — Other Ambulatory Visit: Payer: Self-pay | Admitting: Internal Medicine

## 2022-03-28 NOTE — Telephone Encounter (Signed)
Requested Prescriptions  ?Pending Prescriptions Disp Refills  ?? omeprazole (PRILOSEC) 40 MG capsule [Pharmacy Med Name: OMEPRAZOLE DR 40 MG CAPSULE] 90 capsule 1  ?  Sig: TAKE 1 CAPSULE BY MOUTH EVERY DAY  ?  ? Gastroenterology: Proton Pump Inhibitors Passed - 03/28/2022  9:42 AM  ?  ?  Passed - Valid encounter within last 12 months  ?  Recent Outpatient Visits   ?      ? 1 month ago RUQ abdominal pain  ? [facility] [place], Coralie Keens, NP  ? 2 months ago Encounter for general adult medical examination with abnormal findings  ? [facility] [place], [place] W, NP  ? 3 months ago Witnessed apneic spells  ? [facility] [place], [place] W, NP  ? 4 months ago Primary hypertension  ? [facility] [place], [place], NP  ? 10 months ago Right leg pain  ? [facility] [place], Coralie Keens, NP  ?  ?  ? ?  ?  ?  ? ?

## 2022-04-02 DIAGNOSIS — G4733 Obstructive sleep apnea (adult) (pediatric): Secondary | ICD-10-CM | POA: Diagnosis not present

## 2022-04-03 ENCOUNTER — Other Ambulatory Visit: Payer: Self-pay | Admitting: Internal Medicine

## 2022-04-04 NOTE — Telephone Encounter (Signed)
Requested Prescriptions  Pending Prescriptions Disp Refills  . baclofen (LIORESAL) 10 MG tablet [Pharmacy Med Name: BACLOFEN 10 MG TABLET] 30 tablet     Sig: TAKE 1 TABLET BY MOUTH THREE TIMES A DAY AS NEEDED FOR MUSCLE SPASMS     Analgesics:  Muscle Relaxants - baclofen Passed - 04/03/2022  4:28 PM      Passed - Cr in normal range and within 180 days    Creat  Date Value Ref Range Status  01/24/2022 0.99 0.70 - 1.30 mg/dL Final         Passed - eGFR is 30 or above and within 180 days    GFR calc non Af Amer  Date Value Ref Range Status  09/13/2010 95.90 >60 mL/min Final   GFR  Date Value Ref Range Status  01/31/2021 100.04 >60.00 mL/min Final    Comment:    Calculated using the CKD-EPI Creatinine Equation (2021)   eGFR  Date Value Ref Range Status  01/24/2022 93 > OR = 60 mL/min/1.55m Final    Comment:    The eGFR is based on the CKD-EPI 2021 equation. To calculate  the new eGFR from a previous Creatinine or Cystatin C result, go to https://www.kidney.org/professionals/ kdoqi/gfr%5Fcalculator          Passed - Valid encounter within last 6 months    Recent Outpatient Visits          1 month ago [place]Q abdominal pain   [facility] [place]   2 months ago Encounter for general adult medical examination with abnormal findings   [place]   3 months ago [place]neic spells   [place]   4 months ago [facility] hypertension   [facility]   10 months ago Right leg pain   [facility] [place]

## 2022-04-07 DIAGNOSIS — R0989 Other specified symptoms and signs involving the circulatory and respiratory systems: Secondary | ICD-10-CM | POA: Diagnosis not present

## 2022-04-07 DIAGNOSIS — R0600 Dyspnea, unspecified: Secondary | ICD-10-CM | POA: Diagnosis not present

## 2022-04-07 DIAGNOSIS — R062 Wheezing: Secondary | ICD-10-CM | POA: Diagnosis not present

## 2022-04-07 DIAGNOSIS — F419 Anxiety disorder, unspecified: Secondary | ICD-10-CM | POA: Diagnosis not present

## 2022-04-07 DIAGNOSIS — J42 Unspecified chronic bronchitis: Secondary | ICD-10-CM | POA: Diagnosis not present

## 2022-04-07 DIAGNOSIS — F32A Depression, unspecified: Secondary | ICD-10-CM | POA: Diagnosis not present

## 2022-04-07 DIAGNOSIS — E662 Morbid (severe) obesity with alveolar hypoventilation: Secondary | ICD-10-CM | POA: Diagnosis not present

## 2022-04-07 DIAGNOSIS — G2581 Restless legs syndrome: Secondary | ICD-10-CM | POA: Diagnosis not present

## 2022-04-07 DIAGNOSIS — I10 Essential (primary) hypertension: Secondary | ICD-10-CM | POA: Diagnosis not present

## 2022-04-07 DIAGNOSIS — Z79899 Other long term (current) drug therapy: Secondary | ICD-10-CM | POA: Diagnosis not present

## 2022-04-07 DIAGNOSIS — K219 Gastro-esophageal reflux disease without esophagitis: Secondary | ICD-10-CM | POA: Diagnosis not present

## 2022-04-07 DIAGNOSIS — G4733 Obstructive sleep apnea (adult) (pediatric): Secondary | ICD-10-CM | POA: Diagnosis not present

## 2022-04-07 DIAGNOSIS — R0602 Shortness of breath: Secondary | ICD-10-CM | POA: Diagnosis not present

## 2022-04-07 DIAGNOSIS — J9601 Acute respiratory failure with hypoxia: Secondary | ICD-10-CM | POA: Diagnosis not present

## 2022-04-07 DIAGNOSIS — Z7951 Long term (current) use of inhaled steroids: Secondary | ICD-10-CM | POA: Diagnosis not present

## 2022-04-07 DIAGNOSIS — Z6837 Body mass index (BMI) 37.0-37.9, adult: Secondary | ICD-10-CM | POA: Diagnosis not present

## 2022-04-08 DIAGNOSIS — R0609 Other forms of dyspnea: Secondary | ICD-10-CM | POA: Diagnosis not present

## 2022-04-08 DIAGNOSIS — Z6837 Body mass index (BMI) 37.0-37.9, adult: Secondary | ICD-10-CM | POA: Diagnosis not present

## 2022-04-08 DIAGNOSIS — G4733 Obstructive sleep apnea (adult) (pediatric): Secondary | ICD-10-CM | POA: Diagnosis not present

## 2022-04-08 DIAGNOSIS — R062 Wheezing: Secondary | ICD-10-CM | POA: Diagnosis not present

## 2022-04-08 DIAGNOSIS — J42 Unspecified chronic bronchitis: Secondary | ICD-10-CM | POA: Diagnosis not present

## 2022-04-08 DIAGNOSIS — R0602 Shortness of breath: Secondary | ICD-10-CM | POA: Diagnosis not present

## 2022-04-08 DIAGNOSIS — J479 Bronchiectasis, uncomplicated: Secondary | ICD-10-CM | POA: Diagnosis not present

## 2022-04-09 DIAGNOSIS — R0602 Shortness of breath: Secondary | ICD-10-CM | POA: Diagnosis not present

## 2022-04-09 DIAGNOSIS — G4733 Obstructive sleep apnea (adult) (pediatric): Secondary | ICD-10-CM | POA: Diagnosis not present

## 2022-04-09 DIAGNOSIS — J42 Unspecified chronic bronchitis: Secondary | ICD-10-CM | POA: Diagnosis not present

## 2022-04-09 DIAGNOSIS — R0600 Dyspnea, unspecified: Secondary | ICD-10-CM | POA: Diagnosis not present

## 2022-04-09 DIAGNOSIS — Z6837 Body mass index (BMI) 37.0-37.9, adult: Secondary | ICD-10-CM | POA: Diagnosis not present

## 2022-04-09 DIAGNOSIS — R062 Wheezing: Secondary | ICD-10-CM | POA: Diagnosis not present

## 2022-04-12 ENCOUNTER — Ambulatory Visit: Payer: Federal, State, Local not specified - PPO | Admitting: Internal Medicine

## 2022-04-12 NOTE — Progress Notes (Deleted)
Subjective:    Patient ID: Jonathan [name], male    DOB: [birth year], 51 y.o.   MRN: [id number]  HPI  Patient presents to clinic today with complaint of ER follow-up.  He presents to the ER.  Review of Systems  Past Medical History:  Diagnosis Date   Anxiety    GERD (gastroesophageal reflux disease)    Hypertension     Current Outpatient Medications  Medication Sig Dispense Refill   albuterol (VENTOLIN HFA) 108 (90 Base) MCG/ACT inhaler TAKE 2 PUFFS BY MOUTH EVERY 6 HOURS AS NEEDED 18 each 1   ALPRAZolam (XANAX) 0.25 MG tablet Take 1 tablet (0.25 mg total) by mouth daily as needed for anxiety. (Patient not taking: Reported on 03/16/2022) 10 tablet 0   baclofen (LIORESAL) 10 MG tablet TAKE 1 TABLET BY MOUTH THREE TIMES A DAY AS NEEDED FOR MUSCLE SPASMS 30 tablet 0   citalopram (CELEXA) 40 MG tablet TAKE 1 TABLET BY MOUTH EVERY DAY 90 tablet 1   hydrochlorothiazide (MICROZIDE) 12.5 MG capsule TAKE 1 CAPSULE BY MOUTH EVERY DAY 90 capsule 1   losartan (COZAAR) 100 MG tablet TAKE 1/2 TABLET BY MOUTH EVERY DAY 45 tablet 1   Multiple Vitamin (MULTIVITAMIN) tablet Take 1 tablet by mouth daily.     omeprazole (PRILOSEC) 40 MG capsule TAKE 1 CAPSULE BY MOUTH EVERY DAY 90 capsule 1   rOPINIRole (REQUIP) 2 MG tablet TAKE 1 TABLET BY MOUTH 3 TIMES DAILY. 270 tablet 1   No current facility-administered medications for this visit.    Allergies  Allergen Reactions   Ace Inhibitors     Cough     Family History  Problem Relation Age of Onset   Breast cancer Mother    Hypertension Mother    Prostate cancer Father     Social History   Socioeconomic History   Marital status: Married    Spouse name: Not on file   Number of children: 0   Years of education: Not on file   Highest education level: Not on file  Occupational History   Not on file  Tobacco Use   Smoking status: Former    Types: Cigarettes    Quit date: 1980    Years since quitting: 43.4   Smokeless tobacco:  Current    Types: Chew  Vaping Use   Vaping Use: Never used  Substance and Sexual Activity   Alcohol use: No    Alcohol/week: 0.0 standard drinks   Drug use: Never   Sexual activity: Not on file  Other Topics Concern   Not on file  Social History Narrative   Not on file   Social Determinants of Health   Financial Resource Strain: Not on file  Food Insecurity: Not on file  Transportation Needs: Not on file  Physical Activity: Not on file  Stress: Not on file  Social Connections: Not on file  Intimate Partner Violence: Not on file     Constitutional: Denies fever, malaise, fatigue, headache or abrupt weight changes.  HEENT: Denies eye pain, eye redness, ear pain, ringing in the ears, wax buildup, runny nose, nasal congestion, bloody nose, or sore throat. Respiratory: Denies difficulty breathing, shortness of breath, cough or sputum production.   Cardiovascular: Denies chest pain, chest tightness, palpitations or swelling in the hands or feet.  Gastrointestinal: Denies abdominal pain, bloating, constipation, diarrhea or blood in the stool.  GU: Denies urgency, frequency, pain with urination, burning sensation, blood in urine, odor or discharge. Musculoskeletal: Denies decrease  in range of motion, difficulty with gait, muscle pain or joint pain and swelling.  Skin: Denies redness, rashes, lesions or ulcercations.  Neurological: Denies dizziness, difficulty with memory, difficulty with speech or problems with balance and coordination.  Psych: Denies anxiety, depression, SI/HI.  No other specific complaints in a complete review of systems (except as listed in HPI above).     Objective:   Physical Exam   There were no vitals taken for this visit. Wt Readings from Last 3 Encounters:  03/16/22 251 lb 3.2 oz (113.9 kg)  02/22/22 245 lb (111.1 kg)  01/26/22 246 lb (111.6 kg)    General: Appears their stated age, well developed, well nourished in NAD. Skin: Warm, dry and  intact. No rashes, lesions or ulcerations noted. HEENT: Head: normal shape and size; Eyes: sclera white, no icterus, conjunctiva pink, PERRLA and EOMs intact; Ears: Tm's gray and intact, normal light reflex; Nose: mucosa pink and moist, septum midline; Throat/Mouth: Teeth present, mucosa pink and moist, no exudate, lesions or ulcerations noted.  Neck:  Neck supple, trachea midline. No masses, lumps or thyromegaly present.  Cardiovascular: Normal rate and rhythm. S1,S2 noted.  No murmur, rubs or gallops noted. No JVD or BLE edema. No carotid bruits noted. Pulmonary/Chest: Normal effort and positive vesicular breath sounds. No respiratory distress. No wheezes, rales or ronchi noted.  Abdomen: Soft and nontender. Normal bowel sounds. No distention or masses noted. Liver, spleen and kidneys non palpable. Musculoskeletal: Normal range of motion. No signs of joint swelling. No difficulty with gait.  Neurological: Alert and oriented. Cranial nerves II-XII grossly intact. Coordination normal.  Psychiatric: Mood and affect normal. Behavior is normal. Judgment and thought content normal.    BMET    Component Value Date/Time   NA 138 01/24/2022 1027   K 4.1 01/24/2022 1027   CL 102 01/24/2022 1027   CO2 26 01/24/2022 1027   GLUCOSE 100 (H) 01/24/2022 1027   BUN 14 01/24/2022 1027   CREATININE 0.99 01/24/2022 1027   CALCIUM 9.4 01/24/2022 1027   GFRNONAA 95.90 09/13/2010 1103    Lipid Panel     Component Value Date/Time   CHOL 170 01/24/2022 1027   TRIG 104 01/24/2022 1027   HDL 41 01/24/2022 1027   CHOLHDL 4.1 01/24/2022 1027   VLDL 51.4 (H) 01/31/2021 1035   LDLCALC 109 (H) 01/24/2022 1027    CBC    Component Value Date/Time   WBC 6.9 01/24/2022 1027   RBC 5.53 01/24/2022 1027   HGB 16.0 01/24/2022 1027   HCT 47.6 01/24/2022 1027   PLT 194 01/24/2022 1027   MCV 86.1 01/24/2022 1027   MCH 28.9 01/24/2022 1027   MCHC 33.6 01/24/2022 1027   RDW 12.9 01/24/2022 1027   LYMPHSABS 1.9  02/27/2016 1032   MONOABS 0.6 02/27/2016 1032   EOSABS 0.1 02/27/2016 1032   BASOSABS 0.0 02/27/2016 1032    Hgb A1C Lab Results  Component Value Date   HGBA1C 5.7 (H) 01/24/2022           Assessment & Plan:   ER follow-up for  ER notes, labs and imaging reviewed  Return precautions discussed Webb Silversmith, NP

## 2022-04-16 ENCOUNTER — Ambulatory Visit: Payer: Federal, State, Local not specified - PPO | Admitting: Internal Medicine

## 2022-04-16 ENCOUNTER — Encounter: Payer: Self-pay | Admitting: Internal Medicine

## 2022-04-16 VITALS — BP 128/86 | HR 85 | Temp 97.5°F | Wt 251.0 lb

## 2022-04-16 DIAGNOSIS — J471 Bronchiectasis with (acute) exacerbation: Secondary | ICD-10-CM | POA: Diagnosis not present

## 2022-04-16 DIAGNOSIS — R252 Cramp and spasm: Secondary | ICD-10-CM

## 2022-04-16 NOTE — Progress Notes (Signed)
Subjective:    Patient ID: Jonathan [name], male    DOB: 02-10-1971, 51 y.o.   MRN: [id number]  HPI  Patient presents to clinic today for ER follow-up.  He presented to the ER 5/27 with c/o cough and shortness of breath that had been present since January.  His sats were down in the upper 80s on room air.  Chest x-ray was negative.  Labs were unremarkable.  CT chest was concerning for bronchiectasis.  Pulmonology was consulted.  They recommended he be on steroids, started on Symbicort and neb treatments.  He was discharged on 5/29, advised to follow-up with pulmonology.  Since discharge, he reports persistent cough and intermittent shortness of breath.  He is taking the medications as prescribed.  He has a follow-up with pulmonology scheduled 7/11.  He does not smoke.  He does not believe he has been exposed to any chemicals that could have caused this issue.  He will likely need FMLA form completion.  Review of Systems     Past Medical History:  Diagnosis Date   Anxiety    GERD (gastroesophageal reflux disease)    Hypertension     Current Outpatient Medications  Medication Sig Dispense Refill   albuterol (VENTOLIN HFA) 108 (90 Base) MCG/ACT inhaler TAKE 2 PUFFS BY MOUTH EVERY 6 HOURS AS NEEDED 18 each 1   ALPRAZolam (XANAX) 0.25 MG tablet Take 1 tablet (0.25 mg total) by mouth daily as needed for anxiety. (Patient not taking: Reported on 03/16/2022) 10 tablet 0   baclofen (LIORESAL) 10 MG tablet TAKE 1 TABLET BY MOUTH THREE TIMES A DAY AS NEEDED FOR MUSCLE SPASMS 30 tablet 0   citalopram (CELEXA) 40 MG tablet TAKE 1 TABLET BY MOUTH EVERY DAY 90 tablet 1   hydrochlorothiazide (MICROZIDE) 12.5 MG capsule TAKE 1 CAPSULE BY MOUTH EVERY DAY 90 capsule 1   losartan (COZAAR) 100 MG tablet TAKE 1/2 TABLET BY MOUTH EVERY DAY 45 tablet 1   Multiple Vitamin (MULTIVITAMIN) tablet Take 1 tablet by mouth daily.     omeprazole (PRILOSEC) 40 MG capsule TAKE 1 CAPSULE BY MOUTH EVERY DAY 90 capsule 1    rOPINIRole (REQUIP) 2 MG tablet TAKE 1 TABLET BY MOUTH 3 TIMES DAILY. 270 tablet 1   No current facility-administered medications for this visit.    Allergies  Allergen Reactions   Ace Inhibitors     Cough     Family History  Problem Relation Age of Onset   Breast cancer Mother    Hypertension Mother    Prostate cancer Father     Social History   Socioeconomic History   Marital status: Married    Spouse name: Not on file   Number of children: 0   Years of education: Not on file   Highest education level: Not on file  Occupational History   Not on file  Tobacco Use   Smoking status: Former    Types: Cigarettes    Quit date: 1980    Years since quitting: 43.4   Smokeless tobacco: Current    Types: Chew  Vaping Use   Vaping Use: Never used  Substance and Sexual Activity   Alcohol use: No    Alcohol/week: 0.0 standard drinks   Drug use: Never   Sexual activity: Not on file  Other Topics Concern   Not on file  Social History Narrative   Not on file   Social Determinants of Health   Financial Resource Strain: Not on file  Food  Insecurity: Not on file  Transportation Needs: Not on file  Physical Activity: Not on file  Stress: Not on file  Social Connections: Not on file  Intimate Partner Violence: Not on file     Constitutional: Denies fever, malaise, fatigue, headache or abrupt weight changes.  HEENT: Denies eye pain, eye redness, ear pain, ringing in the ears, wax buildup, runny nose, nasal congestion, bloody nose, or sore throat. Respiratory: Patient reports cough and shortness of breath.  Denies difficulty breathing or sputum production.   Cardiovascular: Denies chest pain, chest tightness, palpitations or swelling in the hands or feet.  Gastrointestinal: Patient reports persistent muscle spasms of abdominal wall.  Denies abdominal pain, bloating, constipation, diarrhea or blood in the stool.  Musculoskeletal: Denies decrease in range of motion,  difficulty with gait, muscle pain or joint pain and swelling.  Skin: Denies redness, rashes, lesions or ulcercations.   No other specific complaints in a complete review of systems (except as listed in HPI above).  Objective:   Physical Exam  BP 128/86 (BP Location: Left Arm, Patient Position: Sitting, Cuff Size: Large)   Pulse 85   Temp (!) 97.5 F (36.4 C) (Temporal)   Wt 251 lb (113.9 kg)   SpO2 99%   BMI 37.07 kg/m   Wt Readings from Last 3 Encounters:  03/16/22 251 lb 3.2 oz (113.9 kg)  02/22/22 245 lb (111.1 kg)  01/26/22 246 lb (111.6 kg)    General: Appears his stated age, obese, in NAD. Skin: Warm, dry and intact.  HEENT: Head: normal shape and size; Eyes: sclera white, PERRLA and EOMs intact; Cardiovascular: Normal rate and rhythm. S1,S2 noted.  No murmur, rubs or gallops noted.  Pulmonary/Chest: Normal effort and diminished breath sounds. No respiratory distress. No wheezes, rales or ronchi noted.  Abdomen: Soft and tender along the right lateral abdomen. Normal bowel sounds. No distention or masses noted.  Musculoskeletal: No difficulty with gait.  Neurological: Alert and oriented.  Psychiatric: Anxious appearing.   BMET    Component Value Date/Time   NA 138 01/24/2022 1027   K 4.1 01/24/2022 1027   CL 102 01/24/2022 1027   CO2 26 01/24/2022 1027   GLUCOSE 100 (H) 01/24/2022 1027   BUN 14 01/24/2022 1027   CREATININE 0.99 01/24/2022 1027   CALCIUM 9.4 01/24/2022 1027   GFRNONAA 95.90 09/13/2010 1103    Lipid Panel     Component Value Date/Time   CHOL 170 01/24/2022 1027   TRIG 104 01/24/2022 1027   HDL 41 01/24/2022 1027   CHOLHDL 4.1 01/24/2022 1027   VLDL 51.4 (H) 01/31/2021 1035   LDLCALC 109 (H) 01/24/2022 1027    CBC    Component Value Date/Time   WBC 6.9 01/24/2022 1027   RBC 5.53 01/24/2022 1027   HGB 16.0 01/24/2022 1027   HCT 47.6 01/24/2022 1027   PLT 194 01/24/2022 1027   MCV 86.1 01/24/2022 1027   MCH 28.9 01/24/2022 1027    MCHC 33.6 01/24/2022 1027   RDW 12.9 01/24/2022 1027   LYMPHSABS 1.9 02/27/2016 1032   MONOABS 0.6 02/27/2016 1032   EOSABS 0.1 02/27/2016 1032   BASOSABS 0.0 02/27/2016 1032    Hgb A1C Lab Results  Component Value Date   HGBA1C 5.7 (H) 01/24/2022            Assessment & Plan:   Hospital follow-up for Bronchiectasis, Muscle Cramps of Abdomen:  ER notes, labs and imaging reviewed He will continue steroids, inhalers and neb treatments as  prescribed He will follow-up with pulmonology as previously scheduled Reviewed MRI abdomen-no cause found for his muscle cramps Electrolytes are normal Encouraged abdominal weight loss as this could be contributing to his symptoms   Return precautions discussed Webb Silversmith, NP

## 2022-04-16 NOTE — Patient Instructions (Signed)
Bronchiectasis  Bronchiectasis is a condition in which the airways in the lungs (bronchi) are damaged and widened. The condition makes it hard for the lungs to get rid of mucus, and it causes mucus to gather in the bronchi. This condition often leads to lung infections, which can make the condition worse. What are the causes? You can be born with this condition, or you can develop it later in life. Common causes of this condition include: Cystic fibrosis. Repeated lung infections, such as pneumonia or tuberculosis. An object or other blockage in the lungs. Breathing in fluid, food, or other objects (aspiration). A problem with the body's defense system (immune system) and lung structure that is present at birth (congenital). Sometimes the cause is not known. What are the signs or symptoms? Common symptoms of this condition include: A daily cough that brings up mucus and lasts for more than 3 weeks. Lung infections that happen often. Shortness of breath and wheezing. Weakness and feeling tired (fatigue). How is this diagnosed? This condition is diagnosed with tests, such as: Chest X-rays or CT scans. These are done to check for changes in the lungs. Breathing tests. These are done to check how well your lungs are working. A test of a sample of your saliva (sputum culture). This test is done to check for infection. Blood tests and other tests. These are done to check for related diseases or causes. How is this treated? Treatment for this condition depends on the severity of the illness and its cause. Treatment may include: Medicines that loosen mucus so it can be coughed up (mucolytics). Medicines that relax the muscles of the bronchi (bronchodilators). Antibiotic medicines to prevent or treat infection. Physical therapy to help clear mucus from the lungs. Techniques may include: Postural drainage. This is when you sit or lie in certain positions so that mucus can drain by gravity. Chest  percussion. This involves tapping the chest or back with a cupped hand. Chest vibration. For this therapy, a hand or special equipment vibrates your chest and back. Surgery to remove the affected part of the lung. This may be done in severe cases. Follow these instructions at home: Medicines Take over-the-counter and prescription medicines only as told by your health care provider. If you were prescribed an antibiotic medicine, take it as told by your health care provider. Do not stop taking the antibiotic even if you start to feel better. Avoid taking sedatives and antihistamines unless your health care provider tells you to take them. These medicines tend to thicken the mucus in the lungs. Managing symptoms Do breathing exercises or techniques to clear your lungs as told by your health care provider. Consider using a cold steam vaporizer or humidifier in your room or home to help loosen secretions. If you have a cough that gets worse at night, try sleeping in a semi-upright position. General instructions Get plenty of rest. Drink enough fluid to keep your urine pale yellow. Stay inside when pollution and ozone levels are high. Stay up to date with vaccinations and immunizations. Avoid cigarette smoke and other lung irritants. Do not use any products that contain nicotine or tobacco. These products include cigarettes, chewing tobacco, and vaping devices, such as e-cigarettes. If you need help quitting, ask your health care provider. Keep all follow-up visits. This is important. Contact a health care provider if: You cough up more sputum than before and the sputum is yellow or green in color. You have a fever or chills. You cannot control your   cough and are losing sleep. Get help right away if: You cough up blood. You have chest pain. You have increasing shortness of breath. You have pain that gets worse or is not controlled with medicines. You have a fever and your symptoms suddenly get  worse. These symptoms may be an emergency. Get help right away. Call 911. Do not wait to see if the symptoms will go away. Do not drive yourself to the hospital. Summary Bronchiectasis is a condition in which the airways in the lungs (bronchi) are damaged and widened. The condition makes it hard for the lungs to get rid of mucus, and it causes mucus to gather in the bronchi. Treatment usually includes therapy to help clear mucus from the lungs. Avoid cigarette smoke and other lung irritants. Stay up to date with vaccinations and immunizations. This information is not intended to replace advice given to you by your health care provider. Make sure you discuss any questions you have with your health care provider. Document Revised: 07/12/2021 Document Reviewed: 05/30/2021 Elsevier Patient Education  2023 Else[employer] Inc[employer]  

## 2022-05-03 DIAGNOSIS — G4733 Obstructive sleep apnea (adult) (pediatric): Secondary | ICD-10-CM | POA: Diagnosis not present

## 2022-05-06 ENCOUNTER — Other Ambulatory Visit: Payer: Self-pay | Admitting: Internal Medicine

## 2022-05-22 ENCOUNTER — Ambulatory Visit (INDEPENDENT_AMBULATORY_CARE_PROVIDER_SITE_OTHER): Payer: Federal, State, Local not specified - PPO | Admitting: Internal Medicine

## 2022-05-22 ENCOUNTER — Ambulatory Visit: Payer: Federal, State, Local not specified - PPO | Admitting: Adult Health

## 2022-05-22 ENCOUNTER — Encounter: Payer: Self-pay | Admitting: Adult Health

## 2022-05-22 DIAGNOSIS — J45909 Unspecified asthma, uncomplicated: Secondary | ICD-10-CM | POA: Insufficient documentation

## 2022-05-22 DIAGNOSIS — J410 Simple chronic bronchitis: Secondary | ICD-10-CM | POA: Diagnosis not present

## 2022-05-22 DIAGNOSIS — G4733 Obstructive sleep apnea (adult) (pediatric): Secondary | ICD-10-CM

## 2022-05-22 DIAGNOSIS — J4540 Moderate persistent asthma, uncomplicated: Secondary | ICD-10-CM

## 2022-05-22 LAB — PULMONARY FUNCTION TEST
DL/VA % pred: 100 %
DL/VA: 4.45 ml/min/mmHg/L
DLCO cor % pred: 104 %
DLCO cor: 29.71 ml/min/mmHg
DLCO unc % pred: 104 %
DLCO unc: 29.71 ml/min/mmHg
FEF 25-75 Post: 3.55 L/sec
FEF 25-75 Pre: 0.88 L/sec
FEF2575-%Change-Post: 303 %
FEF2575-%Pred-Post: 106 %
FEF2575-%Pred-Pre: 26 %
FEV1-%Change-Post: 67 %
FEV1-%Pred-Post: 81 %
FEV1-%Pred-Pre: 48 %
FEV1-Post: 3.07 L
FEV1-Pre: 1.83 L
FEV1FVC-%Change-Post: 26 %
FEV1FVC-%Pred-Pre: 72 %
FEV6-%Change-Post: 33 %
FEV6-%Pred-Post: 91 %
FEV6-%Pred-Pre: 68 %
FEV6-Post: 4.3 L
FEV6-Pre: 3.22 L
FEV6FVC-%Change-Post: 0 %
FEV6FVC-%Pred-Post: 103 %
FEV6FVC-%Pred-Pre: 103 %
FVC-%Change-Post: 33 %
FVC-%Pred-Post: 88 %
FVC-%Pred-Pre: 66 %
FVC-Post: 4.31 L
FVC-Pre: 3.24 L
Post FEV1/FVC ratio: 71 %
Post FEV6/FVC ratio: 100 %
Pre FEV1/FVC ratio: 56 %
Pre FEV6/FVC Ratio: 100 %
RV % pred: 164 %
RV: 3.29 L
TLC % pred: 116 %
TLC: 7.87 L

## 2022-05-22 NOTE — Progress Notes (Signed)
$'@Patient'C$  ID: Jonathan [name], male    DOB: 1971/07/12, 51 y.o.   MRN: [id number]  Chief Complaint  Patient presents with   Follow-up    Referring provider: Jearld Fenton, NP  HPI: 51 year old male seen for sleep consult January 26, 2022 found to have severe sleep apnea and chronic cough since January 2023    TEST/EVENTS :  02/26/22 [facility] Sleep study shows severe sleep apnea with AHI at 62.8/hour and SPO2 low at 79%   Chest x-ray February 19, 2022 showed left basilar linear opacities likely subsegmental atelectasis  05/22/2022 Follow up : OSA and Cough  Patient returns for a 48-monthfollow-up.  Patient was initially seen in March for a sleep consult.  Set up for home sleep study that showed severe sleep apnea.  He has been started on CPAP therapy.  Patient says he is trying to get used to his CPAP.  He is using a full facemask.  He says it does leak occasionally.  He is going to trim his mustache and beard and see if this helps.  Patient says at times he feels like the pressure is either too high or is not getting enough air at times.  Takes his mask off.  Tries to get in over 4 hours but has some trouble occasionally.  CPAP download shows 90% compliance.  Daily average usage at 3.5 hours.  Patient is on AutoSet 5 to 20 cm H2O.  AHI 7.6/hour.  Daily average pressure at 14.3 cm of H2O. We discussed CPAP usage and compliance.  Patient education was given.  Patient's been having ongoing cough and recurrent bronchitis since January 2023.  Patient had a acute respiratory infection in January.  Along that same time patient's wife was positive for COVID-19.  However he tested negative.  Chest x-ray February 19, 2022 showed left lower lobe atelectasis/scarring.  Last visit patient was recommended to begin Zyrtec daily.  Use Delsym for cough.  Continue on omeprazole. Since last visit patient says his cough has totally resolved.  He did have an episode of cough and wheezing in the emergency room on Apr 07, 2022.  Treated for an asthmatic bronchitic flare.  High-resolution CT chest showed no evidence of interstitial lung disease.  There was fairly significant bibasilar atelectasis/scarring with a minor element of bronchiectasis.  Likely to reflect sequela of infection/aspiration.  ILD considered less likely .  Nonspecific 8 mm soft tissue lymph node in the anterior mediastinum.  He was started on Symbicort.  And treated with a course of steroids.  Patient says he did not understand the instructions.  Has not been taking Symbicort. He was set up for pulmonary function testing that was done today.  This showed significant airway reversibility.  FEV1 81%, ratio 71, FVC 88%.,  67% bronchodilator change.  DLCO was 104%.      No Active Allergies  Immunization History  Administered Date(s) Administered   Moderna Sars-Covid-2 Vaccination 03/07/2020, 03/28/2020, 09/16/2020   Td 08/25/2004   Tdap 02/27/2016    Past Medical History:  Diagnosis Date   Anxiety    GERD (gastroesophageal reflux disease)    Hypertension     Tobacco History: Social History   Tobacco Use  Smoking Status Former   Types: Cigarettes   Quit date: 1980   Years since quitting: 43.5  Smokeless Tobacco Current   Types: Chew   Ready to quit: Not Answered Counseling given: Not Answered   Outpatient Medications Prior to Visit  Medication Sig Dispense  Refill   albuterol (VENTOLIN HFA) 108 (90 Base) MCG/ACT inhaler INHALE 2 PUFFS BY MOUTH EVERY 6 HOURS AS NEEDED 18 each 2   ALPRAZolam (XANAX) 0.25 MG tablet Take 1 tablet (0.25 mg total) by mouth daily as needed for anxiety. 10 tablet 0   baclofen (LIORESAL) 10 MG tablet TAKE 1 TABLET BY MOUTH THREE TIMES A DAY AS NEEDED FOR MUSCLE SPASMS 30 tablet 0   budesonide-formoterol (SYMBICORT) 160-4.5 MCG/ACT inhaler Inhale into the lungs.     citalopram (CELEXA) 40 MG tablet TAKE 1 TABLET BY MOUTH EVERY DAY 90 tablet 1   hydrochlorothiazide (MICROZIDE) 12.5 MG capsule TAKE 1  CAPSULE BY MOUTH EVERY DAY 90 capsule 1   ipratropium (ATROVENT) 0.02 % nebulizer solution Inhale into the lungs.     losartan (COZAAR) 100 MG tablet TAKE 1/2 TABLET BY MOUTH EVERY DAY 45 tablet 1   Multiple Vitamin (MULTIVITAMIN) tablet Take 1 tablet by mouth daily.     omeprazole (PRILOSEC) 40 MG capsule TAKE 1 CAPSULE BY MOUTH EVERY DAY 90 capsule 1   rOPINIRole (REQUIP) 2 MG tablet TAKE 1 TABLET BY MOUTH 3 TIMES DAILY. 270 tablet 1   No facility-administered medications prior to visit.     Review of Systems:   Constitutional:   No  weight loss, night sweats,  Fevers, chills,  +fatigue, or  lassitude.  HEENT:   No headaches,  Difficulty swallowing,  Tooth/dental problems, or  Sore throat,                No sneezing, itching, ear ache,  +nasal congestion, post nasal drip,   CV:  No chest pain,  Orthopnea, PND, swelling in lower extremities, anasarca, dizziness, palpitations, syncope.   GI  No heartburn, indigestion, abdominal pain, nausea, vomiting, diarrhea, change in bowel habits, loss of appetite, bloody stools.   Resp:  No excess mucus, no productive cough,  No non-productive cough,  No coughing up of blood.  No change in color of mucus.  No wheezing.  No chest wall deformity  Skin: no rash or lesions.  GU: no dysuria, change in color of urine, no urgency or frequency.  No flank pain, no hematuria   MS:  No joint pain or swelling.  No decreased range of motion.  No back pain.    Physical Exam  BP 110/70 (BP Location: Left Arm, Patient Position: Sitting, Cuff Size: Large)   Pulse 74   Temp 97.9 F (36.6 C) (Oral)   Ht '5\' 9"'$  (1.753 m)   Wt 245 lb (111.1 kg)   SpO2 98%   BMI 36.18 kg/m   GEN: A/Ox3; pleasant , NAD, well nourished    HEENT:  [place]/AT, NOSE-clear, THROAT-clear, no lesions, no postnasal drip or exudate noted.  Class III MP airway No peripheral edema  NECK:  Supple w/ fair ROM; no JVD; normal carotid impulses w/o bruits; no thyromegaly or nodules  palpated; no lymphadenopathy.    RESP  Clear  P & A; w/o, wheezes/ rales/ or rhonchi. no accessory muscle use, no dullness to percussion  CARD:  RRR, no m/r/g, no peripheral edema, pulses intact, no cyanosis or clubbing.  GI:   Soft & nt; nml bowel sounds; no organomegaly or masses detected.   Musco: Warm bil, no deformities or joint swelling noted.   Neuro: alert, no focal deficits noted.    Skin: Warm, no lesions or rashes    Lab Results:    BNP No results found for: "BNP"  ProBNP No results  found for: "PROBNP"  Imaging: No results found.       Latest Ref Rng & Units 05/22/2022    9:50 AM  PFT Results  FVC-Pre L 3.24  P  FVC-Predicted Pre % 66  P  FVC-Post L 4.31  P  FVC-Predicted Post % 88  P  Pre FEV1/FVC % % 56  P  Post FEV1/FCV % % 71  P  FEV1-Pre L 1.83  P  FEV1-Predicted Pre % 48  P  FEV1-Post L 3.07  P  DLCO uncorrected ml/min/mmHg 29.71  P  DLCO UNC% % 104  P  DLCO corrected ml/min/mmHg 29.71  P  DLCO COR %Predicted % 104  P  DLVA Predicted % 100  P  TLC L 7.87  P  TLC % Predicted % 116  P  RV % Predicted % 164  P    P Preliminary result    No results found for: "NITRICOXIDE"      Assessment & Plan:   OSA (obstructive sleep apnea) We will adjust CPAP pressure for comfort.  May need to change from a full facemask to a nasal since he has such significant facial hair to help prevent mask leak.  Encouraged on CPAP compliance and to try to get in at least 6 or more hours of sleep. Change CPAP pressure to auto 8 to 16 cm H2O.  Plan  Patient Instructions  Continue on CPAP at bedtime, wear all night long, need to get in 6 hrs or more. Use with naps.  Change CPAP pressure 8 to 16cm  Work on healthy weight loss Do not drive if sleepy Saline nasal spray Twice daily   Saline nasal gel At bedtime    Continue Zyrtec '10mg'$  daily As needed  drainage .  Delsym 2 tsp Twice daily  for 5 days and As needed  cough  Continue on Omeprazole '20mg'$  daily    Begin Symbicort 160 2 puffs Twice daily  , rinse after use.  Albuterol inhaler As needed  for shortness of breath /wheezing  (this is your rescue inhaler)  Follow-up in 2-3 months and As needed    Please contact office for sooner follow up if symptoms do not improve or worsen or seek emergency care       Asthma New onset asthma.  Patient with 6 months of ongoing symptoms after post viral URI January 2023.  Patient's had ongoing shortness of breath and cough.  Pulmonary function testing is consistent with significant reversibility/asthma. Recommend patient begin Symbicort twice daily.  Asthma action plan discussed.  Albuterol as needed. Check exhaled nitric oxide testing on return visit. Cough has improved with trigger prevention.  Continue on Zyrtec for underlying chronic rhinitis and omeprazole for GERD component.  Plan  Patient Instructions  Continue on CPAP at bedtime, wear all night long, need to get in 6 hrs or more. Use with naps.  Change CPAP pressure 8 to 16cm  Work on healthy weight loss Do not drive if sleepy Saline nasal spray Twice daily   Saline nasal gel At bedtime    Continue Zyrtec '10mg'$  daily As needed  drainage .  Delsym 2 tsp Twice daily  for 5 days and As needed  cough  Continue on Omeprazole '20mg'$  daily   Begin Symbicort 160 2 puffs Twice daily  , rinse after use.  Albuterol inhaler As needed  for shortness of breath /wheezing  (this is your rescue inhaler)  Follow-up in 2-3 months and As needed  Please contact office for sooner follow up if symptoms do not improve or worsen or seek emergency care          Rexene Edison, NP 05/22/2022

## 2022-05-22 NOTE — Addendum Note (Signed)
Addended by: Vanessa Barbara on: 05/22/2022 02:18 PM   Modules accepted: Orders

## 2022-05-22 NOTE — Progress Notes (Signed)
Reviewed and agree with assessment/plan.   Chesley Mires, MD [facility] 05/22/2022, 2:24 PM Pager:  [phone]

## 2022-05-22 NOTE — Assessment & Plan Note (Signed)
New onset asthma.  Patient with 6 months of ongoing symptoms after post viral URI January 2023.  Patient's had ongoing shortness of breath and cough.  Pulmonary function testing is consistent with significant reversibility/asthma. Recommend patient begin Symbicort twice daily.  Asthma action plan discussed.  Albuterol as needed. Check exhaled nitric oxide testing on return visit. Cough has improved with trigger prevention.  Continue on Zyrtec for underlying chronic rhinitis and omeprazole for GERD component.  Plan  Patient Instructions  Continue on CPAP at bedtime, wear all night long, need to get in 6 hrs or more. Use with naps.  Change CPAP pressure 8 to 16cm  Work on healthy weight loss Do not drive if sleepy Saline nasal spray Twice daily   Saline nasal gel At bedtime    Continue Zyrtec '10mg'$  daily As needed  drainage .  Delsym 2 tsp Twice daily  for 5 days and As needed  cough  Continue on Omeprazole '20mg'$  daily   Begin Symbicort 160 2 puffs Twice daily  , rinse after use.  Albuterol inhaler As needed  for shortness of breath /wheezing  (this is your rescue inhaler)  Follow-up in 2-3 months and As needed    Please contact office for sooner follow up if symptoms do not improve or worsen or seek emergency care

## 2022-05-22 NOTE — Patient Instructions (Signed)
Full PFT performed today. °

## 2022-05-22 NOTE — Progress Notes (Signed)
Full PFT performed today. °

## 2022-05-22 NOTE — Patient Instructions (Addendum)
Continue on CPAP at bedtime, wear all night long, need to get in 6 hrs or more. Use with naps.  Change CPAP pressure 8 to 16cm  Work on healthy weight loss Do not drive if sleepy Saline nasal spray Twice daily   Saline nasal gel At bedtime    Continue Zyrtec '10mg'$  daily As needed  drainage .  Delsym 2 tsp Twice daily  for 5 days and As needed  cough  Continue on Omeprazole '20mg'$  daily   Begin Symbicort 160 2 puffs Twice daily  , rinse after use.  Albuterol inhaler As needed  for shortness of breath /wheezing  (this is your rescue inhaler)  Follow-up in 2-3 months and As needed    Please contact office for sooner follow up if symptoms do not improve or worsen or seek emergency care

## 2022-05-22 NOTE — Assessment & Plan Note (Signed)
We will adjust CPAP pressure for comfort.  May need to change from a full facemask to a nasal since he has such significant facial hair to help prevent mask leak.  Encouraged on CPAP compliance and to try to get in at least 6 or more hours of sleep. Change CPAP pressure to auto 8 to 16 cm H2O.  Plan  Patient Instructions  Continue on CPAP at bedtime, wear all night long, need to get in 6 hrs or more. Use with naps.  Change CPAP pressure 8 to 16cm  Work on healthy weight loss Do not drive if sleepy Saline nasal spray Twice daily   Saline nasal gel At bedtime    Continue Zyrtec '10mg'$  daily As needed  drainage .  Delsym 2 tsp Twice daily  for 5 days and As needed  cough  Continue on Omeprazole '20mg'$  daily   Begin Symbicort 160 2 puffs Twice daily  , rinse after use.  Albuterol inhaler As needed  for shortness of breath /wheezing  (this is your rescue inhaler)  Follow-up in 2-3 months and As needed    Please contact office for sooner follow up if symptoms do not improve or worsen or seek emergency care

## 2022-06-02 DIAGNOSIS — G4733 Obstructive sleep apnea (adult) (pediatric): Secondary | ICD-10-CM | POA: Diagnosis not present

## 2022-07-03 DIAGNOSIS — G4733 Obstructive sleep apnea (adult) (pediatric): Secondary | ICD-10-CM | POA: Diagnosis not present

## 2022-07-05 ENCOUNTER — Other Ambulatory Visit: Payer: Self-pay | Admitting: Internal Medicine

## 2022-07-06 NOTE — Telephone Encounter (Signed)
Requested Prescriptions  Pending Prescriptions Disp Refills  . baclofen (LIORESAL) 10 MG tablet [Pharmacy Med Name: BACLOFEN 10 MG TABLET] 30 tablet 0    Sig: TAKE 1 TABLET BY MOUTH THREE TIMES A DAY AS NEEDED FOR MUSCLE SPASMS     Analgesics:  Muscle Relaxants - baclofen Passed - 07/05/2022  4:20 PM      Passed - Cr in normal range and within 180 days    Creat  Date Value Ref Range Status  01/24/2022 0.99 0.70 - 1.30 mg/dL Final         Passed - eGFR is 30 or above and within 180 days    GFR calc non Af Amer  Date Value Ref Range Status  09/13/2010 95.90 >60 mL/min Final   GFR  Date Value Ref Range Status  01/31/2021 100.04 >60.00 mL/min Final    Comment:    Calculated using the CKD-EPI Creatinine Equation (2021)   eGFR  Date Value Ref Range Status  01/24/2022 93 > OR = 60 mL/min/1.17m Final    Comment:    The eGFR is based on the CKD-EPI 2021 equation. To calculate  the new eGFR from a previous Creatinine or Cystatin C result, go to https://www.kidney.org/professionals/ kdoqi/gfr%5Fcalculator          Passed - Valid encounter within last 6 months    Recent Outpatient Visits          2 months ago [facility]   4 months ago RUQ abdominal [facility]   [facility]   5 months ago Encounter for general adult medical examination with abnormal findings   [facility]   6 months ago Witnessed apneic spells   [facility]   7 months ago Primary hypertension   [facility] [place]             . rOPINIRole (REQUIP) 2 MG tablet [Pharmacy Med Name: ROPINIROLE HCL 2 MG TABLET] 270 tablet 1    Sig: TAKE 1 TABLET [place]TH [place]    Neurology:  Parkinsonian Agents Passed - 07/05/2022  4:20 PM      Passed - Last BP in normal range    BP Readings from Last 1  Encounters:  05/22/22 110/70         Passed - Last Heart Rate in normal range    Pulse Readings from Last 1 Encounters:  05/22/22 74         Passed - Valid encounter within last 12 months    Recent Outpatient Visits          2 months ago [facility]chiectasis with acute exacerbation [facility]   [facility]   4 months ago RUQ abdominal pain   [place]   5 months ago Encounter for general adult medical examination with abnormal findings   [facility]   6 months ago Witnessed apneic spells   [facility]   7 months ago Primary hypertension   [facility]

## 2022-07-18 ENCOUNTER — Encounter: Payer: Self-pay | Admitting: Internal Medicine

## 2022-07-18 ENCOUNTER — Ambulatory Visit: Payer: Federal, State, Local not specified - PPO | Admitting: Internal Medicine

## 2022-07-18 VITALS — BP 116/84 | HR 98 | Temp 97.1°F | Ht 69.0 in | Wt 258.0 lb

## 2022-07-18 DIAGNOSIS — F419 Anxiety disorder, unspecified: Secondary | ICD-10-CM | POA: Diagnosis not present

## 2022-07-18 DIAGNOSIS — I10 Essential (primary) hypertension: Secondary | ICD-10-CM

## 2022-07-18 DIAGNOSIS — K760 Fatty (change of) liver, not elsewhere classified: Secondary | ICD-10-CM

## 2022-07-18 DIAGNOSIS — E782 Mixed hyperlipidemia: Secondary | ICD-10-CM | POA: Diagnosis not present

## 2022-07-18 DIAGNOSIS — F32A Depression, unspecified: Secondary | ICD-10-CM

## 2022-07-18 DIAGNOSIS — R7303 Prediabetes: Secondary | ICD-10-CM | POA: Diagnosis not present

## 2022-07-18 DIAGNOSIS — I700 Atherosclerosis of aorta: Secondary | ICD-10-CM | POA: Diagnosis not present

## 2022-07-18 DIAGNOSIS — K219 Gastro-esophageal reflux disease without esophagitis: Secondary | ICD-10-CM

## 2022-07-18 DIAGNOSIS — Z6838 Body mass index (BMI) 38.0-38.9, adult: Secondary | ICD-10-CM

## 2022-07-18 DIAGNOSIS — G2581 Restless legs syndrome: Secondary | ICD-10-CM

## 2022-07-18 DIAGNOSIS — G4733 Obstructive sleep apnea (adult) (pediatric): Secondary | ICD-10-CM

## 2022-07-18 DIAGNOSIS — J4540 Moderate persistent asthma, uncomplicated: Secondary | ICD-10-CM

## 2022-07-18 MED ORDER — GABAPENTIN 100 MG PO CAPS: 100.0000 mg | ORAL_CAPSULE | Freq: Three times a day (TID) | ORAL | 1 refills | Status: DC

## 2022-07-18 MED ORDER — ALPRAZOLAM 0.25 MG PO TABS: 0.2500 mg | ORAL_TABLET | Freq: Every day | ORAL | 0 refills | Status: DC | PRN

## 2022-07-18 MED ORDER — ASPIRIN 81 MG PO TBEC: 81.0000 mg | DELAYED_RELEASE_TABLET | Freq: Every day | ORAL | 12 refills | Status: AC

## 2022-07-18 NOTE — Patient Instructions (Signed)
Restless Legs Syndrome Restless legs syndrome is a condition that causes uncomfortable feelings or sensations in the legs, especially while sitting or lying down. The sensations usually cause an overwhelming urge to move the legs. The arms can also sometimes be affected. The condition can range from mild to severe. The symptoms often interfere with a person's ability to sleep. What are the causes? The cause of this condition is not known. What increases the risk? The following factors may make you more likely to develop this condition: Being older than 50. Pregnancy. Being a woman. In general, the condition is more common in women than in men. A family history of the condition. Having iron deficiency. Overuse of caffeine, nicotine, or alcohol. Certain medical conditions, such as kidney disease, Parkinson's disease, or nerve damage. Certain medicines, such as those for high blood pressure, nausea, colds, allergies, depression, and some heart conditions. What are the signs or symptoms? The main symptom of this condition is uncomfortable sensations in the legs, such as: Pulling. Tingling. Prickling. Throbbing. Crawling. Burning. Usually, the sensations: Affect both sides of the body. Are worse when you sit or lie down. Are worse at night. These may make it difficult to fall asleep. Make you have a strong urge to move your legs. Are temporarily relieved by moving your legs or standing. The arms can also be affected, but this is rare. People who have this condition often have tiredness during the day because of their lack of sleep at night. How is this diagnosed? This condition may be diagnosed based on: Your symptoms. Blood tests. In some cases, you may be monitored in a sleep lab by a specialist (a sleep study). This can detect any disruptions in your sleep. How is this treated? This condition is treated by managing the symptoms. This may include: Lifestyle changes, such as  exercising, using relaxation techniques, and avoiding caffeine, alcohol, or tobacco. Iron supplements. Medicines. Parkinson's medications may be tried first. Anti-seizure medications can also be helpful. Follow these instructions at home: General instructions Take over-the-counter and prescription medicines only as told by your health care provider. Use methods to help relieve the uncomfortable sensations, such as: Massaging your legs. Walking or stretching. Taking a cold or hot bath. Keep all follow-up visits. This is important. Lifestyle     Practice good sleep habits. For example, go to bed and get up at the same time every day. Most adults should get 7-9 hours of sleep each night. Exercise regularly. Try to get at least 30 minutes of exercise most days of the week. Practice ways of relaxing, such as yoga or meditation. Avoid caffeine and alcohol. Do not use any products that contain nicotine or tobacco. These products include cigarettes, chewing tobacco, and vaping devices, such as e-cigarettes. If you need help quitting, ask your health care provider. Where to find more information National Institute of Neurological Disorders and Stroke: www.ninds.nih.gov Contact a health care provider if: Your symptoms get worse or they do not improve with treatment. Summary Restless legs syndrome is a condition that causes uncomfortable feelings or sensations in the legs, especially while sitting or lying down. The symptoms often interfere with your ability to sleep. This condition is treated by managing the symptoms. You may need to make lifestyle changes or take medicines. This information is not intended to replace advice given to you by your health care provider. Make sure you discuss any questions you have with your health care provider. Document Revised: 06/11/2021 Document Reviewed: 06/11/2021 Elsevier Patient Education    2023 Elsevier Inc.  

## 2022-07-18 NOTE — Assessment & Plan Note (Signed)
A1c today Encourage low-carb diet and exercise for weight loss 

## 2022-07-18 NOTE — Assessment & Plan Note (Signed)
We will trial gabapentin D/C ropinirole

## 2022-07-18 NOTE — Assessment & Plan Note (Signed)
Encouraged exercise for weight loss C-Met today

## 2022-07-18 NOTE — Assessment & Plan Note (Signed)
C-Met and lipid profile today  Encouraged him to consume a low-fat diet We will have him start a baby aspirin

## 2022-07-18 NOTE — Assessment & Plan Note (Signed)
Controlled on losartan and HCTZ Reinforced DASH diet and exercise for weight loss C-Met today

## 2022-07-18 NOTE — Assessment & Plan Note (Signed)
Try to identify and avoid foods that trigger reflux Encourage weight loss as this can help reduce reflux symptoms Continue omeprazole  

## 2022-07-18 NOTE — Assessment & Plan Note (Signed)
Stable on citalopram and Xanax Support offered 

## 2022-07-18 NOTE — Assessment & Plan Note (Signed)
Encourage weight loss as this can help reduce sleep apnea symptoms Continue CPAP 

## 2022-07-18 NOTE — Assessment & Plan Note (Signed)
Encourage diet and exercise for weight loss 

## 2022-07-18 NOTE — Assessment & Plan Note (Signed)
Continue Symbicort and albuterol per pulmonology

## 2022-07-18 NOTE — Assessment & Plan Note (Signed)
C-Met and lipid profile today Encouraged him to consume low-fat diet

## 2022-07-18 NOTE — Progress Notes (Signed)
Subjective:    Patient ID: Jonathan [name], male    DOB: August 29, 1971, 51 y.o.   MRN: [id number]  HPI  Patient presents to clinic today for 52-monthfollow-up of chronic conditions.  OSA: He averages 6 to 7 hours of sleep per night with the use of his CPAP.  Sleep study from 02/2022 reviewed.  Anxiety and Depression: Chronic, managed on Citalopram.  He takes Xanax only as needed for flying.  He is not currently seeing a therapist.  He denies SI/HI.  HTN: His BP today is 116/84.  He is taking Losartan and HCTZ as prescribed.  ECG from 05/2012 reviewed.  GERD: He denies breakthrough on Omeprazole.  There is no upper GI on file.  RLS: Persistent.  He does not feel like the Ropinirole is effective.  He does not follow with neurology.  Prediabetes: His last A1c was 5.7%, 01/2022.  He does not check his sugars.  He is not taking any oral diabetic medication at this time.  HLD with Aortic Atherosclerosis: His last LDL was 109, triglycerides 104, 01/2022.  He is not taking any cholesterol-lowering medication at this time.  He does not take an aspirin.  He does not consume a low-fat diet.  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  He is not taking any oral antihistamine but is using Atrovent.  PFTs from 05/2022 reviewed.  He follows with pulmonology.  He also c/o throbbing sensation in bilateral arms. He noticed this months. He denies neck or shoulder pain. He denies numbness, tingling or weakness in his upper extremities. He takes Ibuprofen OTC as needed with minimal relief of symptoms .  Review of Systems     Past Medical History:  Diagnosis Date   Anxiety    GERD (gastroesophageal reflux disease)    Hypertension     Current Outpatient Medications  Medication Sig Dispense Refill   albuterol (VENTOLIN HFA) 108 (90 Base) MCG/ACT inhaler INHALE 2 PUFFS BY MOUTH EVERY 6 HOURS AS NEEDED 18 each 2   ALPRAZolam (XANAX) 0.25 MG tablet Take 1 tablet (0.25 mg total) by  mouth daily as needed for anxiety. 10 tablet 0   baclofen (LIORESAL) 10 MG tablet TAKE 1 TABLET BY MOUTH THREE TIMES A DAY AS NEEDED FOR MUSCLE SPASMS 30 tablet 0   budesonide-formoterol (SYMBICORT) 160-4.5 MCG/ACT inhaler Inhale into the lungs.     citalopram (CELEXA) 40 MG tablet TAKE 1 TABLET BY MOUTH EVERY DAY 90 tablet 1   hydrochlorothiazide (MICROZIDE) 12.5 MG capsule TAKE 1 CAPSULE BY MOUTH EVERY DAY 90 capsule 1   ipratropium (ATROVENT) 0.02 % nebulizer solution Inhale into the lungs.     losartan (COZAAR) 100 MG tablet TAKE 1/2 TABLET BY MOUTH EVERY DAY 45 tablet 1   Multiple Vitamin (MULTIVITAMIN) tablet Take 1 tablet by mouth daily.     omeprazole (PRILOSEC) 40 MG capsule TAKE 1 CAPSULE BY MOUTH EVERY DAY 90 capsule 1   rOPINIRole (REQUIP) 2 MG tablet TAKE 1 TABLET BY MOUTH THREE TIMES A DAY 270 tablet 1   No current facility-administered medications for this visit.    No Active Allergies  Family History  Problem Relation Age of Onset   Breast cancer Mother    Hypertension Mother    Prostate cancer Father     Social History   Socioeconomic History   Marital status: Married    Spouse name: Not on file   Number of children: 0   Years of education: Not on file  Highest education level: Not on file  Occupational History   Not on file  Tobacco Use   Smoking status: Former    Types: Cigarettes    Quit date: 1980    Years since quitting: 43.7   Smokeless tobacco: Current    Types: Chew  Vaping Use   Vaping Use: Never used  Substance and Sexual Activity   Alcohol use: No    Alcohol/week: 0.0 standard drinks of alcohol   Drug use: Never   Sexual activity: Not on file  Other Topics Concern   Not on file  Social History Narrative   Not on file   Social Determinants of Health   Financial Resource Strain: Not on file  Food Insecurity: Not on file  Transportation Needs: Not on file  Physical Activity: Not on file  Stress: Not on file  Social Connections: Not  on file  Intimate Partner Violence: Not on file     Constitutional: Denies fever, malaise, fatigue, headache or abrupt weight changes.  HEENT: Denies eye pain, eye redness, ear pain, ringing in the ears, wax buildup, runny nose, nasal congestion, bloody nose, or sore throat. Respiratory: Denies difficulty breathing, shortness of breath, cough or sputum production.   Cardiovascular: Denies chest pain, chest tightness, palpitations or swelling in the hands or feet.  Gastrointestinal: Denies abdominal pain, bloating, constipation, diarrhea or blood in the stool.  GU: Denies urgency, frequency, pain with urination, burning sensation, blood in urine, odor or discharge. Musculoskeletal: Denies decrease in range of motion, difficulty with gait, muscle pain or joint pain and swelling.  Skin: Denies redness, rashes, lesions or ulcercations.  Neurological: Patient reports restless legs.  Denies dizziness, difficulty with memory, difficulty with speech or problems with balance and coordination.  Psych: Patient has a history of anxiety and depression.  Denies SI/HI.  No other specific complaints in a complete review of systems (except as listed in HPI above).  Objective:   Physical Exam  BP 116/84 (BP Location: Left Arm, Patient Position: Sitting, Cuff Size: Large)   Pulse 98   Temp (!) 97.1 F (36.2 C) (Temporal)   Ht '5\' 9"'$  (1.753 m)   Wt 258 lb (117 kg)   SpO2 99%   BMI 38.10 kg/m   Wt Readings from Last 3 Encounters:  05/22/22 245 lb (111.1 kg)  04/16/22 251 lb (113.9 kg)  03/16/22 251 lb 3.2 oz (113.9 kg)    General: Appears his stated age, obese, in NAD. Skin: Warm, dry and intact. HEENT: Head: normal shape and size; Eyes: sclera white, no icterus, conjunctiva pink, PERRLA and EOMs intact;  Cardiovascular: Normal rate and rhythm. S1,S2 noted.  No murmur, rubs or gallops noted. No JVD or BLE edema. No carotid bruits noted. Pulmonary/Chest: Normal effort and positive vesicular breath  sounds. No respiratory distress. No wheezes, rales or ronchi noted.  Abdomen: Soft and nontender. Normal bowel sounds.  Musculoskeletal: Normal flexion, extension and rotation of the cervical spine.  No bony tenderness noted over the spine.  Normal internal and external rotation of the shoulders.  Strength 5/5 BUE.  Handgrips equal.  No difficulty with gait.  Neurological: Alert and oriented. Cranial nerves II-XII grossly intact. Coordination normal.  Psychiatric: Mood and affect normal.  Anxious appearing. Judgment and thought content normal.    BMET    Component Value Date/Time   NA 138 01/24/2022 1027   K 4.1 01/24/2022 1027   CL 102 01/24/2022 1027   CO2 26 01/24/2022 1027   GLUCOSE 100 (H)  01/24/2022 1027   BUN 14 01/24/2022 1027   CREATININE 0.99 01/24/2022 1027   CALCIUM 9.4 01/24/2022 1027   GFRNONAA 95.90 09/13/2010 1103    Lipid Panel     Component Value Date/Time   CHOL 170 01/24/2022 1027   TRIG 104 01/24/2022 1027   HDL 41 01/24/2022 1027   CHOLHDL 4.1 01/24/2022 1027   VLDL 51.4 (H) 01/31/2021 1035   LDLCALC 109 (H) 01/24/2022 1027    CBC    Component Value Date/Time   WBC 6.9 01/24/2022 1027   RBC 5.53 01/24/2022 1027   HGB 16.0 01/24/2022 1027   HCT 47.6 01/24/2022 1027   PLT 194 01/24/2022 1027   MCV 86.1 01/24/2022 1027   MCH 28.9 01/24/2022 1027   MCHC 33.6 01/24/2022 1027   RDW 12.9 01/24/2022 1027   LYMPHSABS 1.9 02/27/2016 1032   MONOABS 0.6 02/27/2016 1032   EOSABS 0.1 02/27/2016 1032   BASOSABS 0.0 02/27/2016 1032    Hgb A1C Lab Results  Component Value Date   HGBA1C 5.7 (H) 01/24/2022           Assessment & Plan:    RTC in 6 months for your annual exam Webb Silversmith, NP

## 2022-07-19 LAB — COMPLETE METABOLIC PANEL WITH GFR
AG Ratio: 2 (calc) (ref 1.0–2.5)
ALT: 25 U/L (ref 9–46)
AST: 19 U/L (ref 10–35)
Albumin: 4.7 g/dL (ref 3.6–5.1)
Alkaline phosphatase (APISO): 81 U/L (ref 35–144)
BUN: 10 mg/dL (ref 7–25)
CO2: 23 mmol/L (ref 20–32)
Calcium: 9.4 mg/dL (ref 8.6–10.3)
Chloride: 104 mmol/L (ref 98–110)
Creat: 0.91 mg/dL (ref 0.70–1.30)
Globulin: 2.3 g/dL (calc) (ref 1.9–3.7)
Glucose, Bld: 71 mg/dL (ref 65–139)
Potassium: 3.9 mmol/L (ref 3.5–5.3)
Sodium: 140 mmol/L (ref 135–146)
Total Bilirubin: 0.4 mg/dL (ref 0.2–1.2)
Total Protein: 7 g/dL (ref 6.1–8.1)
eGFR: 102 mL/min/{1.73_m2} (ref >=60)

## 2022-07-19 LAB — LIPID PANEL
Cholesterol: 176 mg/dL (ref <200)
HDL: 45 mg/dL (ref >=40)
LDL Cholesterol (Calc): 100 mg/dL (calc) — ABNORMAL HIGH
Non-HDL Cholesterol (Calc): 131 mg/dL (calc) — ABNORMAL HIGH (ref <130)
Total CHOL/HDL Ratio: 3.9 (calc) (ref <5.0)
Triglycerides: 184 mg/dL — ABNORMAL HIGH (ref <150)

## 2022-07-19 LAB — HEMOGLOBIN A1C
Hgb A1c MFr Bld: 5.7 % of total Hgb — ABNORMAL HIGH (ref <5.7)
Mean Plasma Glucose: 117 mg/dL
eAG (mmol/L): 6.5 mmol/L

## 2022-08-01 ENCOUNTER — Other Ambulatory Visit: Payer: Self-pay | Admitting: Internal Medicine

## 2022-08-02 NOTE — Telephone Encounter (Signed)
Requested Prescriptions  Pending Prescriptions Disp Refills  . losartan (COZAAR) 100 MG tablet [Pharmacy Med Name: LOSARTAN POTASSIUM 100 MG TAB] 45 tablet 1    Sig: TAKE 1/2 TABLET BY MOUTH EVERY DAY     Cardiovascular:  Angiotensin Receptor Blockers Passed - 08/01/2022  7:24 PM      Passed - Cr in normal range and within 180 days    Creat  Date Value Ref Range Status  07/18/2022 0.91 0.70 - 1.30 mg/dL Final         Passed - K in normal range and within 180 days    Potassium  Date Value Ref Range Status  07/18/2022 3.9 3.5 - 5.3 mmol/L Final         Passed - Patient is not pregnant      Passed - Last BP in normal range    BP Readings from Last 1 Encounters:  07/18/22 116/84         Passed - Valid encounter within last 6 months    Recent Outpatient Visits          2 weeks ago Anxiety and depression   [place], Coralie Keens, NP   3 months ago Bronchiectasis with acute exacerbation [facility])   [facility] [place], Coralie Keens, NP   5 months ago RUQ abdominal pain   [place], Coralie Keens, NP   6 months ago Encounter for general adult medical examination with abnormal findings   [facility] [place], Coralie Keens, NP   7 months ago Witnessed apneic spells   [facility] [place], Coralie Keens, [place]

## 2022-08-03 DIAGNOSIS — G4733 Obstructive sleep apnea (adult) (pediatric): Secondary | ICD-10-CM | POA: Diagnosis not present

## 2022-08-27 ENCOUNTER — Encounter: Payer: Self-pay | Admitting: Adult Health

## 2022-08-27 ENCOUNTER — Ambulatory Visit: Payer: Federal, State, Local not specified - PPO | Admitting: Adult Health

## 2022-08-27 DIAGNOSIS — J4540 Moderate persistent asthma, uncomplicated: Secondary | ICD-10-CM

## 2022-08-27 DIAGNOSIS — J209 Acute bronchitis, unspecified: Secondary | ICD-10-CM | POA: Insufficient documentation

## 2022-08-27 DIAGNOSIS — G2581 Restless legs syndrome: Secondary | ICD-10-CM

## 2022-08-27 DIAGNOSIS — G4733 Obstructive sleep apnea (adult) (pediatric): Secondary | ICD-10-CM

## 2022-08-27 MED ORDER — AZITHROMYCIN 250 MG PO TABS: ORAL_TABLET | ORAL | 0 refills | Status: AC

## 2022-08-27 NOTE — Assessment & Plan Note (Signed)
Continue gabapentin per PCP

## 2022-08-27 NOTE — Progress Notes (Signed)
$'@Patient'J$  ID: Jonathan [name], male    DOB: 1971/04/14, 51 y.o.   MRN: [id number]  Chief Complaint  Patient presents with   Follow-up    Referring provider: Jearld Fenton, NP  HPI: 51 year old male seen for sleep consult January 26, 2022 found to have severe sleep apnea and chronic cough since January 2023 consistent with Asthma   TEST/EVENTS :  02/26/22 HST Sleep study shows severe sleep apnea with AHI at 62.8/hour and SPO2 low at 79%    Chest x-ray February 19, 2022 showed left basilar linear opacities likely subsegmental atelectasis  pulmonary function testing -  This showed significant airway reversibility.  FEV1 81%, ratio 71, FVC 88%.,  67% bronchodilator change.  DLCO was 104%.  High-resolution CT chest showed no evidence of interstitial lung disease.  There was fairly significant bibasilar atelectasis/scarring with a minor element of bronchiectasis.  Likely to reflect sequela of infection/aspiration.  ILD considered less likely .  Nonspecific 8 mm soft tissue lymph node in the anterior mediastinum.  08/27/2022 Follow up : OSA and Asthma  Patient returns for 8-monthfollow-up.  Patient was seen in March 2023 for sleep consult found to have severe sleep apnea with AHI at 62.8/hour on home sleep study.  Patient was recommended begin on CPAP.  Patient says he is trying to get used to his CPAP.  Says the mask is still very uncomfortable.  Last visit CPAP was changed to auto CPAP 8 to 16 cm H2O.  CPAP download shows excellent compliance with 97% usage.  Daily average usage at 4 hours.  AHI 9.4/hour.  Positive air leaks.  Daily average pressure at 15.5 cm H2O. Using full face mask. Feels much better since starting . [place] able to wear as much .   History of chronic cough and recurrent bronchitis since January 2023.  Initial chest x-ray showed left lower lobe atelectasis and scarring.  Subsequent high-resolution CT chest showed no  evidence of interstitial lung disease.  Bibasilar atelectasis and scarring with a moderate element of bronchiectasis felt to be reflective of a sequela of previous infection/aspiration.  Patient was treated with Zyrtec, Delsym and omeprazole.  PFT showed significant airway reversibility felt consistent with asthma.  He was started on Symbicort and a course of steroids.  Since last visit patient is feeling much better with resolution of cough and wheezing. More active. [place]  Patient says when he returned back patient developed cold-like symptoms with nasal congestion and watery eyes ear fullness and productive cough with thick green mucus.  He denies any fever or hemoptysis.  Appetite is good with no nausea vomiting diarrhea.   No Known Allergies  Immunization History  Administered Date(s) Administered   Moderna Sars-Covid-2 Vaccination 03/07/2020, 03/28/2020, 09/16/2020   Td 08/25/2004   Tdap 02/27/2016    Past Medical History:  Diagnosis Date   Anxiety    GERD (gastroesophageal reflux disease)    Hypertension     Tobacco History: Social History   Tobacco Use  Smoking Status Former   Types: Cigarettes   Quit date: 1980   Years since quitting: 43.8  Smokeless Tobacco Current   Types: Chew   Ready to quit: Not Answered Counseling given: Not Answered   Outpatient Medications Prior to Visit  Medication Sig Dispense Refill   albuterol (VENTOLIN HFA) 108 (90 Base) MCG/ACT inhaler INHALE 2 PUFFS BY MOUTH EVERY 6 HOURS AS NEEDED 18 each 2  ALPRAZolam (XANAX) 0.25 MG tablet Take 1 tablet (0.25 mg total) by mouth daily as needed for anxiety. 10 tablet 0   aspirin EC 81 MG tablet Take 1 tablet (81 mg total) by mouth daily. Swallow whole. 30 tablet 12   baclofen (LIORESAL) 10 MG tablet TAKE 1 TABLET BY MOUTH THREE TIMES A DAY AS NEEDED FOR MUSCLE SPASMS 30 tablet 0   budesonide-formoterol (SYMBICORT) 160-4.5 MCG/ACT inhaler Inhale into the lungs.     citalopram  (CELEXA) 40 MG tablet TAKE 1 TABLET BY MOUTH EVERY DAY 90 tablet 1   gabapentin (NEURONTIN) 100 MG capsule Take 1 capsule (100 mg total) by mouth 3 (three) times daily. 270 capsule 1   hydrochlorothiazide (MICROZIDE) 12.5 MG capsule TAKE 1 CAPSULE BY MOUTH EVERY DAY 90 capsule 1   ipratropium (ATROVENT) 0.02 % nebulizer solution Inhale into the lungs.     losartan (COZAAR) 100 MG tablet TAKE 1/2 TABLET BY MOUTH EVERY DAY 45 tablet 1   Multiple Vitamin (MULTIVITAMIN) tablet Take 1 tablet by mouth daily.     omeprazole (PRILOSEC) 40 MG capsule TAKE 1 CAPSULE BY MOUTH EVERY DAY 90 capsule 1   No facility-administered medications prior to visit.     Review of Systems:   Constitutional:   No  weight loss, night sweats,  Fevers, chills,  +fatigue, or  lassitude.  HEENT:   No headaches,  Difficulty swallowing,  Tooth/dental problems, or  Sore throat,                No sneezing, itching, ear ache,  +nasal congestion, post nasal drip,   CV:  No chest pain,  Orthopnea, PND, swelling in lower extremities, anasarca, dizziness, palpitations, syncope.   GI  No heartburn, indigestion, abdominal pain, nausea, vomiting, diarrhea, change in bowel habits, loss of appetite, bloody stools.   Resp:   No chest wall deformity  Skin: no rash or lesions.  GU: no dysuria, change in color of urine, no urgency or frequency.  No flank pain, no hematuria   MS:  No joint pain or swelling.  No decreased range of motion.  No back pain.    Physical Exam  BP 130/70 (BP Location: Left Arm, Patient Position: Sitting, Cuff Size: Normal)   Pulse 95   Temp 98.4 F (36.9 C) (Oral)   Ht '5\' 9"'$  (1.753 m)   Wt 248 lb 9.6 oz (112.8 kg)   SpO2 97%   BMI 36.71 kg/m   GEN: A/Ox3; pleasant , NAD, well nourished    HEENT:  [place]/AT,   NOSE-clear, THROAT-clear, no lesions, no postnasal drip or exudate noted.   NECK:  Supple w/ fair ROM; no JVD; normal carotid impulses w/o bruits; no thyromegaly or nodules palpated; no  lymphadenopathy.    RESP  Clear  P & A; w/o, wheezes/ rales/ or rhonchi. no accessory muscle use, no dullness to percussion  CARD:  RRR, no m/r/g, no peripheral edema, pulses intact, no cyanosis or clubbing.  GI:   Soft & nt; nml bowel sounds; no organomegaly or masses detected.   Musco: Warm bil, no deformities or joint swelling noted.   Neuro: alert, no focal deficits noted.    Skin: Warm, no lesions or rashes    Lab Results:  CBC    Component Value Date/Time   WBC 6.9 01/24/2022 1027   RBC 5.53 01/24/2022 1027   HGB 16.0 01/24/2022 1027   HCT 47.6 01/24/2022 1027   PLT 194 01/24/2022 1027   MCV 86.1 01/24/2022  1027   MCH 28.9 01/24/2022 1027   MCHC 33.6 01/24/2022 1027   RDW 12.9 01/24/2022 1027   LYMPHSABS 1.9 02/27/2016 1032   MONOABS 0.6 02/27/2016 1032   EOSABS 0.1 02/27/2016 1032   BASOSABS 0.0 02/27/2016 1032    BMET    Component Value Date/Time   NA 140 07/18/2022 1100   K 3.9 07/18/2022 1100   CL 104 07/18/2022 1100   CO2 23 07/18/2022 1100   GLUCOSE 71 07/18/2022 1100   BUN 10 07/18/2022 1100   CREATININE 0.91 07/18/2022 1100   CALCIUM 9.4 07/18/2022 1100   GFRNONAA 95.90 09/13/2010 1103    BNP No results found for: "BNP"  ProBNP No results found for: "PROBNP"  Imaging: No results found.       Latest Ref Rng & Units 05/22/2022    9:50 AM  PFT Results  FVC-Pre L 3.24   FVC-Predicted Pre % 66   FVC-Post L 4.31   FVC-Predicted Post % 88   Pre FEV1/FVC % % 56   Post FEV1/FCV % % 71   FEV1-Pre L 1.83   FEV1-Predicted Pre % 48   FEV1-Post L 3.07   DLCO uncorrected ml/min/mmHg 29.71   DLCO UNC% % 104   DLCO corrected ml/min/mmHg 29.71   DLCO COR %Predicted % 104   DLVA Predicted % 100   TLC L 7.87   TLC % Predicted % 116   RV % Predicted % 164     No results found for: "NITRICOXIDE"      Assessment & Plan:   OSA (obstructive sleep apnea) Severe obstructive sleep apnea.  Patient is encouraged on compliance.  We discussed  setting up for CPAP titration study.  Patient still has significant residuals.  Suspect may be due to mask leaks.  He does not want to change his current mask.  Patient says that he will increase his usage and adjust mass to see if this helps.  We discussed changing to a nasal mask however he does not want to do this.  On return if continues to have ongoing residuals would highly recommend a CPAP titration study.  Plan  Patient Instructions  Continue on CPAP at bedtime, wear all night long, need to get in 6 hrs or more. Use with naps.  Work on healthy weight loss Do not drive if sleepy Saline nasal spray Twice daily   Saline nasal gel At bedtime    Begin Zpack , take as directed. Continue Zyrtec '10mg'$  daily As needed  drainage .  Delsym 2 tsp Twice daily  for 5 days and As needed  cough  Continue on Omeprazole '20mg'$  daily   Continue on Symbicort 160 2 puffs Twice daily  , rinse after use.  Albuterol inhaler  As needed  for shortness of breath /wheezing  (this is your rescue inhaler)  Follow-up in 4 months with Dr. Ander Slade or Tyan Dy NP  Please contact office for sooner follow up if symptoms do not improve or worsen or seek emergency care      Asthma Improved control.  Patient now with URI symptoms.  Continue on trigger prevention.  Plan  Patient Instructions  Continue on CPAP at bedtime, wear all night long, need to get in 6 hrs or more. Use with naps.  Work on healthy weight loss Do not drive if sleepy Saline nasal spray Twice daily   Saline nasal gel At bedtime    Begin Zpack , take as directed. Continue Zyrtec '10mg'$  daily As needed  drainage .  Delsym 2 tsp Twice daily  for 5 days and As needed  cough  Continue on Omeprazole '20mg'$  daily   Continue on Symbicort 160 2 puffs Twice daily  , rinse after use.  Albuterol inhaler  As needed  for shortness of breath /wheezing  (this is your rescue inhaler)  Follow-up in 4 months with Dr. Ander Slade or Tyshay Adee NP  Please contact office for  sooner follow up if symptoms do not improve or worsen or seek emergency care   '   Acute bronchitis Acute URI/bronchitis.  Symptom management.  Begin Z-Pak.  Plan Patient Instructions  Continue on CPAP at bedtime, wear all night long, need to get in 6 hrs or more. Use with naps.  Work on healthy weight loss Do not drive if sleepy Saline nasal spray Twice daily   Saline nasal gel At bedtime    Begin Zpack , take as directed. Continue Zyrtec '10mg'$  daily As needed  drainage .  Delsym 2 tsp Twice daily  for 5 days and As needed  cough  Continue on Omeprazole '20mg'$  daily   Continue on Symbicort 160 2 puffs Twice daily  , rinse after use.  Albuterol inhaler  As needed  for shortness of breath /wheezing  (this is your rescue inhaler)  Follow-up in 4 months with Dr. Ander Slade or Balian Schaller NP  Please contact office for sooner follow up if symptoms do not improve or worsen or seek emergency care   '   RLS (restless legs syndrome) Continue gabapentin per PCP     Rexene Edison, NP 08/27/2022

## 2022-08-27 NOTE — Assessment & Plan Note (Addendum)
Acute URI/bronchitis.  Symptom management.  Begin Z-Pak.  Plan Patient Instructions  Continue on CPAP at bedtime, wear all night long, need to get in 6 hrs or more. Use with naps.  Work on healthy weight loss Do not drive if sleepy Saline nasal spray Twice daily   Saline nasal gel At bedtime    Begin Zpack , take as directed. Continue Zyrtec '10mg'$  daily As needed  drainage .  Delsym 2 tsp Twice daily  for 5 days and As needed  cough  Continue on Omeprazole '20mg'$  daily   Continue on Symbicort 160 2 puffs Twice daily  , rinse after use.  Albuterol inhaler  As needed  for shortness of breath /wheezing  (this is your rescue inhaler)  Follow-up in 4 months with Dr. Ander Slade or Kriste Broman NP  Please contact office for sooner follow up if symptoms do not improve or worsen or seek emergency care   '

## 2022-08-27 NOTE — Patient Instructions (Addendum)
Continue on CPAP at bedtime, wear all night long, need to get in 6 hrs or more. Use with naps.  Work on healthy weight loss Do not drive if sleepy Saline nasal spray Twice daily   Saline nasal gel At bedtime    Begin Zpack , take as directed. Continue Zyrtec '10mg'$  daily As needed  drainage .  Delsym 2 tsp Twice daily  for 5 days and As needed  cough  Continue on Omeprazole '20mg'$  daily   Continue on Symbicort 160 2 puffs Twice daily  , rinse after use.  Albuterol inhaler  As needed  for shortness of breath /wheezing  (this is your rescue inhaler)  Follow-up in 4 months with Dr. Ander Slade or Albertus Chiarelli NP  Please contact office for sooner follow up if symptoms do not improve or worsen or seek emergency care

## 2022-08-27 NOTE — Assessment & Plan Note (Addendum)
Severe obstructive sleep apnea.  Patient is encouraged on compliance.  We discussed setting up for CPAP titration study.  Patient still has significant residuals.  Suspect may be due to mask leaks.  He does not want to change his current mask.  Patient says that he will increase his usage and adjust mass to see if this helps.  We discussed changing to a nasal mask however he does not want to do this.  On return if continues to have ongoing residuals would highly recommend a CPAP titration study.  Plan  Patient Instructions  Continue on CPAP at bedtime, wear all night long, need to get in 6 hrs or more. Use with naps.  Work on healthy weight loss Do not drive if sleepy Saline nasal spray Twice daily   Saline nasal gel At bedtime    Begin Zpack , take as directed. Continue Zyrtec '10mg'$  daily As needed  drainage .  Delsym 2 tsp Twice daily  for 5 days and As needed  cough  Continue on Omeprazole '20mg'$  daily   Continue on Symbicort 160 2 puffs Twice daily  , rinse after use.  Albuterol inhaler  As needed  for shortness of breath /wheezing  (this is your rescue inhaler)  Follow-up in 4 months with Dr. Ander Slade or Ahmari Garton NP  Please contact office for sooner follow up if symptoms do not improve or worsen or seek emergency care

## 2022-08-27 NOTE — Assessment & Plan Note (Addendum)
Improved control.  Patient now with URI symptoms.  Continue on trigger prevention.  Plan  Patient Instructions  Continue on CPAP at bedtime, wear all night long, need to get in 6 hrs or more. Use with naps.  Work on healthy weight loss Do not drive if sleepy Saline nasal spray Twice daily   Saline nasal gel At bedtime    Begin Zpack , take as directed. Continue Zyrtec '10mg'$  daily As needed  drainage .  Delsym 2 tsp Twice daily  for 5 days and As needed  cough  Continue on Omeprazole '20mg'$  daily   Continue on Symbicort 160 2 puffs Twice daily  , rinse after use.  Albuterol inhaler  As needed  for shortness of breath /wheezing  (this is your rescue inhaler)  Follow-up in 4 months with Dr. Ander Slade or Steven Veazie NP  Please contact office for sooner follow up if symptoms do not improve or worsen or seek emergency care   '

## 2022-09-02 ENCOUNTER — Other Ambulatory Visit: Payer: Self-pay | Admitting: Internal Medicine

## 2022-09-02 DIAGNOSIS — G4733 Obstructive sleep apnea (adult) (pediatric): Secondary | ICD-10-CM | POA: Diagnosis not present

## 2022-09-03 DIAGNOSIS — G4733 Obstructive sleep apnea (adult) (pediatric): Secondary | ICD-10-CM | POA: Diagnosis not present

## 2022-09-04 NOTE — Telephone Encounter (Signed)
Requested Prescriptions  Pending Prescriptions Disp Refills  . citalopram (CELEXA) 40 MG tablet [Pharmacy Med Name: CITALOPRAM HBR 40 MG TABLET] 90 tablet 1    Sig: TAKE 1 TABLET BY MOUTH EVERY DAY     Psychiatry:  Antidepressants - SSRI Passed - 09/02/2022  7:09 PM      Passed - Completed PHQ-2 or PHQ-9 in the last 360 days      Passed - Valid encounter within last 6 months    Recent Outpatient Visits          1 month ago Anxiety and depression   [place], [place] W, NP   4 months ago Bronchiectasis with acute exacerbation [facility] [place] [facility])   [facility], NP   6 months ago RUQ abdominal pain   [place], Coralie Keens, NP   7 months ago Encounter for general adult medical examination with abnormal findings   [facility] [place], Coralie Keens, NP   8 months ago Witnessed apneic spells   [facility] [place], Coralie Keens, NP      Future Appointments            In 3 months [place], Fonnie Mu, NP [facility]

## 2022-09-29 DIAGNOSIS — I10 Essential (primary) hypertension: Secondary | ICD-10-CM | POA: Diagnosis not present

## 2022-09-29 DIAGNOSIS — K219 Gastro-esophageal reflux disease without esophagitis: Secondary | ICD-10-CM | POA: Diagnosis not present

## 2022-09-29 DIAGNOSIS — Z20822 Contact with and (suspected) exposure to covid-19: Secondary | ICD-10-CM | POA: Diagnosis not present

## 2022-09-29 DIAGNOSIS — J210 Acute bronchiolitis due to respiratory syncytial virus: Secondary | ICD-10-CM | POA: Diagnosis not present

## 2022-09-29 DIAGNOSIS — Z79899 Other long term (current) drug therapy: Secondary | ICD-10-CM | POA: Diagnosis not present

## 2022-09-29 DIAGNOSIS — R059 Cough, unspecified: Secondary | ICD-10-CM | POA: Diagnosis not present

## 2022-09-29 DIAGNOSIS — R509 Fever, unspecified: Secondary | ICD-10-CM | POA: Diagnosis not present

## 2022-09-29 DIAGNOSIS — R0600 Dyspnea, unspecified: Secondary | ICD-10-CM | POA: Diagnosis not present

## 2022-09-29 DIAGNOSIS — R0602 Shortness of breath: Secondary | ICD-10-CM | POA: Diagnosis not present

## 2022-09-29 DIAGNOSIS — Z888 Allergy status to other drugs, medicaments and biological substances status: Secondary | ICD-10-CM | POA: Diagnosis not present

## 2022-09-29 DIAGNOSIS — E785 Hyperlipidemia, unspecified: Secondary | ICD-10-CM | POA: Diagnosis not present

## 2022-09-30 DIAGNOSIS — J210 Acute bronchiolitis due to respiratory syncytial virus: Secondary | ICD-10-CM | POA: Diagnosis not present

## 2022-10-03 DIAGNOSIS — G4733 Obstructive sleep apnea (adult) (pediatric): Secondary | ICD-10-CM | POA: Diagnosis not present

## 2022-10-04 DIAGNOSIS — G4733 Obstructive sleep apnea (adult) (pediatric): Secondary | ICD-10-CM | POA: Diagnosis not present

## 2022-10-08 ENCOUNTER — Encounter: Payer: Self-pay | Admitting: Internal Medicine

## 2022-10-08 ENCOUNTER — Ambulatory Visit: Payer: Federal, State, Local not specified - PPO | Admitting: Internal Medicine

## 2022-10-08 VITALS — BP 126/88 | HR 87 | Temp 97.1°F | Wt 252.0 lb

## 2022-10-08 DIAGNOSIS — B338 Other specified viral diseases: Secondary | ICD-10-CM | POA: Diagnosis not present

## 2022-10-08 DIAGNOSIS — Z6837 Body mass index (BMI) 37.0-37.9, adult: Secondary | ICD-10-CM

## 2022-10-08 DIAGNOSIS — J4550 Severe persistent asthma, uncomplicated: Secondary | ICD-10-CM | POA: Diagnosis not present

## 2022-10-08 NOTE — Assessment & Plan Note (Signed)
Encourage diet and exercise for weight loss as this can help improve his lung function

## 2022-10-08 NOTE — Progress Notes (Signed)
Subjective:    Patient ID: Jonathan [name], male    DOB: 1971/05/28, 51 y.o.   MRN: [id number]  HPI  Patient presents to clinic today for ER follow-up.  He presented to the ER 11/18 with complaint of cough, chest tightness and shortness of breath.  He tested positive for RSV.  His chest x-ray was negative.  He was treated with a DuoNeb with significant improvement in symptoms.  He was discharged and advised to follow-up with his PCP and pulmonology.  Since that time, he reports slight postnasal drip and dry cough but denies chest tightness or shortness of breath.  He does have a history of severe asthma, managed on Symbicort and Albuterol.  PFTs from 05/2022 reviewed.  Review of Systems     Past Medical History:  Diagnosis Date   Anxiety    GERD (gastroesophageal reflux disease)    Hypertension     Current Outpatient Medications  Medication Sig Dispense Refill   albuterol (VENTOLIN HFA) 108 (90 Base) MCG/ACT inhaler INHALE 2 PUFFS BY MOUTH EVERY 6 HOURS AS NEEDED 18 each 2   ALPRAZolam (XANAX) 0.25 MG tablet Take 1 tablet (0.25 mg total) by mouth daily as needed for anxiety. 10 tablet 0   aspirin EC 81 MG tablet Take 1 tablet (81 mg total) by mouth daily. Swallow whole. 30 tablet 12   baclofen (LIORESAL) 10 MG tablet TAKE 1 TABLET BY MOUTH THREE TIMES A DAY AS NEEDED FOR MUSCLE SPASMS 30 tablet 0   budesonide-formoterol (SYMBICORT) 160-4.5 MCG/ACT inhaler Inhale into the lungs.     citalopram (CELEXA) 40 MG tablet TAKE 1 TABLET BY MOUTH EVERY DAY 90 tablet 1   gabapentin (NEURONTIN) 100 MG capsule Take 1 capsule (100 mg total) by mouth 3 (three) times daily. 270 capsule 1   hydrochlorothiazide (MICROZIDE) 12.5 MG capsule TAKE 1 CAPSULE BY MOUTH EVERY DAY 90 capsule 1   ipratropium (ATROVENT) 0.02 % nebulizer solution Inhale into the lungs.     losartan (COZAAR) 100 MG tablet TAKE 1/2 TABLET BY MOUTH EVERY DAY 45 tablet 1   Multiple Vitamin (MULTIVITAMIN) tablet Take 1 tablet by  mouth daily.     omeprazole (PRILOSEC) 40 MG capsule TAKE 1 CAPSULE BY MOUTH EVERY DAY 90 capsule 1   No current facility-administered medications for this visit.    No Known Allergies  Family History  Problem Relation Age of Onset   Breast cancer Mother    Hypertension Mother    Prostate cancer Father     Social History   Socioeconomic History   Marital status: Married    Spouse name: Not on file   Number of children: 0   Years of education: Not on file   Highest education level: Not on file  Occupational History   Not on file  Tobacco Use   Smoking status: Former    Types: Cigarettes    Quit date: 1980    Years since quitting: 43.9   Smokeless tobacco: Current    Types: Chew  Vaping Use   Vaping Use: Never used  Substance and Sexual Activity   Alcohol use: No    Alcohol/week: 0.0 standard drinks of alcohol   Drug use: Never   Sexual activity: Not on file  Other Topics Concern   Not on file  Social History Narrative   Not on file   Social Determinants of Health   Financial Resource Strain: Not on file  Food Insecurity: Not on file  Transportation Needs: Not  on file  Physical Activity: Not on file  Stress: Not on file  Social Connections: Not on file  Intimate Partner Violence: Not on file     Constitutional: Denies fever, malaise, fatigue, headache or abrupt weight changes.  HEENT: Patient reports postnasal drip.  Denies eye pain, eye redness, ear pain, ringing in the ears, wax buildup, runny nose, nasal congestion, bloody nose, or sore throat. Respiratory: Patient reports cough.  Denies difficulty breathing, shortness of breath.   Cardiovascular: Denies chest pain, chest tightness, palpitations or swelling in the hands or feet.  Neurological: Denies dizziness, difficulty with memory, difficulty with speech or problems with balance and coordination.    No other specific complaints in a complete review of systems (except as listed in HPI  above).  Objective:   Physical Exam   BP 126/88 (BP Location: Left Arm, Patient Position: Sitting, Cuff Size: Normal)   Pulse 87   Temp (!) 97.1 F (36.2 C) (Temporal)   Wt 252 lb (114.3 kg)   SpO2 96%   BMI 37.21 kg/m   Wt Readings from Last 3 Encounters:  10/08/22 252 lb (114.3 kg)  08/27/22 248 lb 9.6 oz (112.8 kg)  07/18/22 258 lb (117 kg)    General: Appears his stated age, obese, in NAD. HEENT: Head: normal shape and size; Eyes: sclera white, no icterus, conjunctiva pink, PERRLA and EOMs intact;  Neck: No adenopathy noted. Cardiovascular: Normal rate and rhythm. S1,S2 noted.  No murmur, rubs or gallops noted.  Pulmonary/Chest: Normal effort and diminished breath sounds. No respiratory distress. No wheezes, rales or ronchi noted.  Musculoskeletal:  No difficulty with gait.  Neurological: Alert and oriented.   BMET    Component Value Date/Time   NA 140 07/18/2022 1100   K 3.9 07/18/2022 1100   CL 104 07/18/2022 1100   CO2 23 07/18/2022 1100   GLUCOSE 71 07/18/2022 1100   BUN 10 07/18/2022 1100   CREATININE 0.91 07/18/2022 1100   CALCIUM 9.4 07/18/2022 1100   GFRNONAA 95.90 09/13/2010 1103    Lipid Panel     Component Value Date/Time   CHOL 176 07/18/2022 1100   TRIG 184 (H) 07/18/2022 1100   HDL 45 07/18/2022 1100   CHOLHDL 3.9 07/18/2022 1100   VLDL 51.4 (H) 01/31/2021 1035   LDLCALC 100 (H) 07/18/2022 1100    CBC    Component Value Date/Time   WBC 6.9 01/24/2022 1027   RBC 5.53 01/24/2022 1027   HGB 16.0 01/24/2022 1027   HCT 47.6 01/24/2022 1027   PLT 194 01/24/2022 1027   MCV 86.1 01/24/2022 1027   MCH 28.9 01/24/2022 1027   MCHC 33.6 01/24/2022 1027   RDW 12.9 01/24/2022 1027   LYMPHSABS 1.9 02/27/2016 1032   MONOABS 0.6 02/27/2016 1032   EOSABS 0.1 02/27/2016 1032   BASOSABS 0.0 02/27/2016 1032    Hgb A1C Lab Results  Component Value Date   HGBA1C 5.7 (H) 07/18/2022           Assessment & Plan:   ER follow-up for RSV,  Severe Asthma:  ER notes, labs and imaging reviewed Discussed trial of antihistamine daily for postnasal drip.  He reports he has tried this in the past and it has not seemed to help.  It also increases his pill burden and he would like to try to avoid this if at all possible Encouraged him to continue Symbicort and Albuterol as prescribed Encouraged aerobic conditioning to help improve lung function He will follow-up with pulmonology  as previously scheduled   RTC in 4 months for your annual exam Webb Silversmith, NP

## 2022-10-08 NOTE — Patient Instructions (Signed)
Asthma, Adult ? ?Asthma is a long-term (chronic) condition that causes recurrent episodes in which the lower airways in the lungs become tight and narrow. The narrowing is caused by inflammation and tightening of the smooth muscle around the lower airways. ?Asthma episodes, also called asthma attacks or asthma flares, may cause coughing, making high-pitched whistling sounds when you breathe, most often when you breathe out (wheezing), shortness of breath, and chest pain. The airways may produce extra mucus caused by the inflammation and irritation. During an attack, it can be difficult to breathe. Asthma attacks can range from minor to life-threatening. ?Asthma cannot be cured, but medicines and lifestyle changes can help control it and treat acute attacks. It is important to keep your asthma well controlled so the condition does not interfere with your daily life. ?What are the causes? ?This condition is believed to be caused by inherited (genetic) and environmental factors, but its exact cause is not known. ?What can trigger an asthma attack? ?Many things can bring on an asthma attack or make symptoms worse. These triggers are different for every person. Common triggers include: ?Allergens and irritants like mold, dust, pet dander, cockroaches, pollen, air pollution, and chemical odors. ?Cigarette smoke. ?Weather changes and cold air. ?Stress and strong emotional responses such as crying or laughing hard. ?Certain medications such as aspirin or beta blockers. ?Infections and inflammatory conditions, such as the flu, a cold, pneumonia, or inflammation of the nasal membranes (rhinitis). ?Gastroesophageal reflux disease (GERD). ?What are the signs or symptoms? ?Symptoms may occur right after exposure to an asthma trigger or hours later and can vary by person. Common signs and symptoms include: ?Wheezing. ?Trouble breathing (shortness of breath). ?Excessive nighttime or early morning coughing. ?Chest  tightness. ?Tiredness (fatigue) with minimal activity. ?Difficulty talking in complete sentences. ?Poor exercise tolerance. ?How is this diagnosed? ?This condition is diagnosed based on: ?A physical exam and your medical history. ?Tests, which may include: ?Lung function studies to evaluate the flow of air in your lungs. ?Allergy tests. ?Imaging tests, such as X-rays. ?How is this treated? ?There is no cure, but symptoms can be controlled with proper treatment. Treatment usually involves: ?Identifying and avoiding your asthma triggers. ?Inhaled medicines. Two types are commonly used to treat asthma, depending on severity: ?Controller medicines. These help prevent asthma symptoms from occurring. They are taken every day. ?Fast-acting reliever or rescue medicines. These quickly relieve asthma symptoms. They are used as needed and provide short-term relief. ?Using other medicines, such as: ?Allergy medicines, such as antihistamines, if your asthma attacks are triggered by allergens. ?Immune medicines (immunomodulators). These are medicines that help control the immune system. ?Using supplemental oxygen. This is only needed during a severe episode. ?Creating an asthma action plan. An asthma action plan is a written plan for managing and treating your asthma attacks. This plan includes: ?A list of your asthma triggers and how to avoid them. ?Information about when medicines should be taken and when their dosage should be changed. ?Instructions about using a device called a peak flow meter. A peak flow meter measures how well the lungs are working and the severity of your asthma. It helps you monitor your condition. ?Follow these instructions at home: ?Take over-the-counter and prescription medicines only as told by your health care provider. ?Stay up to date on all vaccinations as recommended by your healthcare provider, including vaccines for the flu and pneumonia. ?Use a peak flow meter and keep track of your peak flow  readings. ?Understand and use your asthma   action plan to address any asthma flares. ?Do not smoke or allow anyone to smoke in your home. ?Contact a health care provider if: ?You have wheezing, shortness of breath, or a cough that is not responding to medicines. ?Your medicines are causing side effects, such as a rash, itching, swelling, or trouble breathing. ?You need to use a reliever medicine more than 2-3 times a week. ?Your peak flow reading is still at 50-79% of your personal best after following your action plan for 1 hour. ?You have a fever and shortness of breath. ?Get help right away if: ?You are getting worse and do not respond to treatment during an asthma attack. ?You are short of breath when at rest or when doing very little physical activity. ?You have difficulty eating, drinking, or talking. ?You have chest pain or tightness. ?You develop a fast heartbeat or palpitations. ?You have a bluish color to your lips or fingernails. ?You are light-headed or dizzy, or you faint. ?Your peak flow reading is less than 50% of your personal best. ?You feel too tired to breathe normally. ?These symptoms may be an emergency. Get help right away. Call 911. ?Do not wait to see if the symptoms will go away. ?Do not drive yourself to the hospital. ?Summary ?Asthma is a long-term (chronic) condition that causes recurrent episodes in which the airways become tight and narrow. Asthma episodes, also called asthma attacks or asthma flares, can cause coughing, wheezing, shortness of breath, and chest pain. ?Asthma cannot be cured, but medicines and lifestyle changes can help keep it well controlled and prevent asthma flares. ?Make sure you understand how to avoid triggers and how and when to use your medicines. ?Asthma attacks can range from minor to life-threatening. Get help right away if you have an asthma attack and do not respond to treatment with your usual rescue medicines. ?This information is not intended to replace  advice given to you by your health care provider. Make sure you discuss any questions you have with your health care provider. ?Document Revised: 08/16/2021 Document Reviewed: 08/07/2021 ?Elsevier Patient Education ? 2023 [employer] Inc. ? ?

## 2022-10-15 ENCOUNTER — Other Ambulatory Visit: Payer: Self-pay | Admitting: Internal Medicine

## 2022-10-15 NOTE — Telephone Encounter (Signed)
Requested Prescriptions  Pending Prescriptions Disp Refills   baclofen (LIORESAL) 10 MG tablet [Pharmacy Med Name: BACLOFEN 10 MG TABLET] 30 tablet 0    Sig: TAKE 1 TABLET BY MOUTH THREE TIMES A DAY AS NEEDED FOR MUSCLE SPASM     Analgesics:  Muscle Relaxants - baclofen Passed - 10/15/2022  9:06 AM      Passed - Cr in normal range and within 180 days    Creat  Date Value Ref Range Status  07/18/2022 0.91 0.70 - 1.30 mg/dL Final         Passed - eGFR is 30 or above and within 180 days    GFR calc non Af Amer  Date Value Ref Range Status  09/13/2010 95.90 >60 mL/min Final   GFR  Date Value Ref Range Status  01/31/2021 100.04 >60.00 mL/min Final    Comment:    Calculated using the CKD-EPI Creatinine Equation (2021)   eGFR  Date Value Ref Range Status  07/18/2022 102 > OR = 60 mL/min/1.73m2 Final         Passed - Valid encounter within last 6 months    Recent Outpatient Visits           1 week ago RSV (respiratory syncytial virus infection)   [facility], Regina W, NP   2 months ago Anxiety and depression   [facility] Ba[facility], Regina W, NP   6 months ago Bronchiectasis with acute exacerbation (HCC)   [facility] Baity, Regina W, NP   7 months ago RUQ abdominal pain   [facility] Baity, Regina W, NP   8 months ago Encounter for general adult medical examination with abnormal findings   [facility] Baity, Regina W, NP       Future Appointments             In 2 months Parrett, Tammy S, NP [facility]              

## 2022-10-16 ENCOUNTER — Other Ambulatory Visit: Payer: Self-pay | Admitting: Internal Medicine

## 2022-10-16 NOTE — Telephone Encounter (Signed)
Requested Prescriptions  Pending Prescriptions Disp Refills   hydrochlorothiazide (MICROZIDE) 12.5 MG capsule [Pharmacy Med Name: HYDROCHLOROTHIAZIDE 12.5 MG CP] 90 capsule 1    Sig: TAKE 1 CAPSULE BY MOUTH EVERY DAY     Cardiovascular: Diuretics - Thiazide Passed - 10/16/2022  6:51 AM      Passed - Cr in normal range and within 180 days    Creat  Date Value Ref Range Status  07/18/2022 0.91 0.70 - 1.30 mg/dL Final         Passed - K in normal range and within 180 days    Potassium  Date Value Ref Range Status  07/18/2022 3.9 3.5 - 5.3 mmol/L Final         Passed - Na in normal range and within 180 days    Sodium  Date Value Ref Range Status  07/18/2022 140 135 - 146 mmol/L Final         Passed - Last BP in normal range    BP Readings from Last 1 Encounters:  10/08/22 126/88         Passed - Valid encounter within last 6 months    Recent Outpatient Visits           1 week ago RSV (respiratory syncytial virus infection)   [facility], Coralie Keens, NP   3 months ago Anxiety and depression   [facility] [place], [place] W, NP   6 months ago Bronchiectasis with acute exacerbation [facility])   [facility], Coralie Keens, NP   7 months ago RUQ abdominal pain   [facility] [place], Coralie Keens, NP   8 months ago Encounter for general adult medical examination with abnormal findings   [facility], Coralie Keens, NP       Future Appointments             In 2 months Parrett, Fonnie Mu, NP [place] [facility]             omeprazole (PRILOSEC) 40 MG capsule [Pharmacy Med Name: OMEPRAZOLE DR 40 MG CAPSULE] 90 capsule 3    Sig: TAKE 1 [place]     Gastroenterology: Proton Pump Inhibitors Passed - 10/16/2022  6:51 AM      Passed - Valid encounter within last 12 months    Recent Outpatient Visits           1 week ago RSV (respiratory syncytial virus infection)   [facility], Coralie Keens, NP   3 months ago Anxiety and depression   [facility] [place], [place] W, NP   6 months ago Bronchiectasis with acute exacerbation [facility])   [facility], NP   7 months ago RUQ abdominal pain   [facility] [place], Coralie Keens, NP   8 months ago Encounter for general adult medical examination with abnormal findings   [facility], Coralie Keens, NP       Future Appointments             In 2 months Parrett, Fonnie Mu, NP [facility]

## 2022-11-02 DIAGNOSIS — G4733 Obstructive sleep apnea (adult) (pediatric): Secondary | ICD-10-CM | POA: Diagnosis not present

## 2022-11-03 DIAGNOSIS — G4733 Obstructive sleep apnea (adult) (pediatric): Secondary | ICD-10-CM | POA: Diagnosis not present

## 2022-11-08 ENCOUNTER — Ambulatory Visit: Payer: Federal, State, Local not specified - PPO | Admitting: Internal Medicine

## 2022-11-08 ENCOUNTER — Encounter: Payer: Self-pay | Admitting: Internal Medicine

## 2022-11-08 VITALS — BP 121/76 | HR 82 | Temp 97.9°F | Resp 16 | Ht 69.0 in | Wt 260.8 lb

## 2022-11-08 DIAGNOSIS — G2581 Restless legs syndrome: Secondary | ICD-10-CM | POA: Diagnosis not present

## 2022-11-08 DIAGNOSIS — T887XXA Unspecified adverse effect of drug or medicament, initial encounter: Secondary | ICD-10-CM

## 2022-11-08 NOTE — Patient Instructions (Signed)
Restless Legs Syndrome Restless legs syndrome is a condition that causes uncomfortable feelings or sensations in the legs, especially while sitting or lying down. The sensations usually cause an overwhelming urge to move the legs. The arms can also sometimes be affected. The condition can range from mild to severe. The symptoms often interfere with a person's ability to sleep. What are the causes? The cause of this condition is not known. What increases the risk? The following factors may make you more likely to develop this condition: Being older than 50. Pregnancy. Being a woman. In general, the condition is more common in women than in men. A family history of the condition. Having iron deficiency. Overuse of caffeine, nicotine, or alcohol. Certain medical conditions, such as kidney disease, Parkinson's disease, or nerve damage. Certain medicines, such as those for high blood pressure, nausea, colds, allergies, depression, and some heart conditions. What are the signs or symptoms? The main symptom of this condition is uncomfortable sensations in the legs, such as: Pulling. Tingling. Prickling. Throbbing. Crawling. Burning. Usually, the sensations: Affect both sides of the body. Are worse when you sit or lie down. Are worse at night. These may make it difficult to fall asleep. Make you have a strong urge to move your legs. Are temporarily relieved by moving your legs or standing. The arms can also be affected, but this is rare. People who have this condition often have tiredness during the day because of their lack of sleep at night. How is this diagnosed? This condition may be diagnosed based on: Your symptoms. Blood tests. In some cases, you may be monitored in a sleep lab by a specialist (a sleep study). This can detect any disruptions in your sleep. How is this treated? This condition is treated by managing the symptoms. This may include: Lifestyle changes, such as  exercising, using relaxation techniques, and avoiding caffeine, alcohol, or tobacco. Iron supplements. Medicines. Parkinson's medications may be tried first. Anti-seizure medications can also be helpful. Follow these instructions at home: General instructions Take over-the-counter and prescription medicines only as told by your health care provider. Use methods to help relieve the uncomfortable sensations, such as: Massaging your legs. Walking or stretching. Taking a cold or hot bath. Keep all follow-up visits. This is important. Lifestyle     Practice good sleep habits. For example, go to bed and get up at the same time every day. Most adults should get 7-9 hours of sleep each night. Exercise regularly. Try to get at least 30 minutes of exercise most days of the week. Practice ways of relaxing, such as yoga or meditation. Avoid caffeine and alcohol. Do not use any products that contain nicotine or tobacco. These products include cigarettes, chewing tobacco, and vaping devices, such as e-cigarettes. If you need help quitting, ask your health care provider. Where to find more information National Institute of Neurological Disorders and Stroke: www.ninds.nih.gov Contact a health care provider if: Your symptoms get worse or they do not improve with treatment. Summary Restless legs syndrome is a condition that causes uncomfortable feelings or sensations in the legs, especially while sitting or lying down. The symptoms often interfere with your ability to sleep. This condition is treated by managing the symptoms. You may need to make lifestyle changes or take medicines. This information is not intended to replace advice given to you by your health care provider. Make sure you discuss any questions you have with your health care provider. Document Revised: 06/11/2021 Document Reviewed: 06/11/2021 Elsevier Patient Education    2023 Elsevier Inc.  

## 2022-11-08 NOTE — Progress Notes (Signed)
Subjective:    Patient ID: Jonathan [name], male    DOB: Jan 14, 1971, 51 y.o.   MRN: [id number]  HPI  Patient presents to clinic today with complaint of dizziness and muscle spasms.  He reports he recently started taking Gabapentin for restless legs and feels like this may be a side effect. He stopped taking the Gabepentin and the symptoms have resolved. He restarted taking his Ropinerole.  Review of Systems     Past Medical History:  Diagnosis Date   Anxiety    GERD (gastroesophageal reflux disease)    Hypertension     Current Outpatient Medications  Medication Sig Dispense Refill   albuterol (VENTOLIN HFA) 108 (90 Base) MCG/ACT inhaler INHALE 2 PUFFS BY MOUTH EVERY 6 HOURS AS NEEDED 18 each 2   ALPRAZolam (XANAX) 0.25 MG tablet Take 1 tablet (0.25 mg total) by mouth daily as needed for anxiety. 10 tablet 0   aspirin EC 81 MG tablet Take 1 tablet (81 mg total) by mouth daily. Swallow whole. 30 tablet 12   baclofen (LIORESAL) 10 MG tablet TAKE 1 TABLET BY MOUTH THREE TIMES A DAY AS NEEDED FOR MUSCLE SPASM 30 tablet 0   budesonide-formoterol (SYMBICORT) 160-4.5 MCG/ACT inhaler Inhale into the lungs.     citalopram (CELEXA) 40 MG tablet TAKE 1 TABLET BY MOUTH EVERY DAY 90 tablet 1   gabapentin (NEURONTIN) 100 MG capsule Take 1 capsule (100 mg total) by mouth 3 (three) times daily. 270 capsule 1   hydrochlorothiazide (MICROZIDE) 12.5 MG capsule TAKE 1 CAPSULE BY MOUTH EVERY DAY 90 capsule 1   ipratropium (ATROVENT) 0.02 % nebulizer solution Inhale into the lungs.     losartan (COZAAR) 100 MG tablet TAKE 1/2 TABLET BY MOUTH EVERY DAY 45 tablet 1   Multiple Vitamin (MULTIVITAMIN) tablet Take 1 tablet by mouth daily.     omeprazole (PRILOSEC) 40 MG capsule TAKE 1 CAPSULE BY MOUTH EVERY DAY 90 capsule 3   No current facility-administered medications for this visit.    No Known Allergies  Family History  Problem Relation Age of Onset   Breast cancer Mother    Hypertension  Mother    Prostate cancer Father     Social History   Socioeconomic History   Marital status: Married    Spouse name: Not on file   Number of children: 0   Years of education: Not on file   Highest education level: Not on file  Occupational History   Not on file  Tobacco Use   Smoking status: Former    Types: Cigarettes    Quit date: 1980    Years since quitting: 44.0   Smokeless tobacco: Current    Types: Chew  Vaping Use   Vaping Use: Never used  Substance and Sexual Activity   Alcohol use: No    Alcohol/week: 0.0 standard drinks of alcohol   Drug use: Never   Sexual activity: Not on file  Other Topics Concern   Not on file  Social History Narrative   Not on file   Social Determinants of Health   Financial Resource Strain: Not on file  Food Insecurity: Not on file  Transportation Needs: Not on file  Physical Activity: Not on file  Stress: Not on file  Social Connections: Not on file  Intimate Partner Violence: Not on file     Constitutional: Denies fever, malaise, fatigue, headache or abrupt weight changes.  Respiratory: Denies difficulty breathing, shortness of breath, cough or sputum production.  Cardiovascular: Denies chest pain, chest tightness, palpitations or swelling in the hands or feet.  Musculoskeletal: Pt reports muscle cramps. Denies decrease in range of motion, difficulty with gait, or joint pain and swelling.  Skin: Denies redness, rashes, lesions or ulcercations.  Neurological: Pt reports restless legs, dizziness. Denies difficulty with memory, difficulty with speech or problems with balance and coordination.    No other specific complaints in a complete review of systems (except as listed in HPI above).  Objective:   Physical Exam BP 121/76 (BP Location: Right Arm, Patient Position: Sitting, Cuff Size: Large)   Pulse 82   Temp 97.9 F (36.6 C) (Temporal)   Resp 16   Ht '5\' 9"'$  (1.753 m)   Wt 260 lb 12.8 oz (118.3 kg)   SpO2 99%   BMI  38.51 kg/m   Wt Readings from Last 3 Encounters:  10/08/22 252 lb (114.3 kg)  08/27/22 248 lb 9.6 oz (112.8 kg)  07/18/22 258 lb (117 kg)    General: Appears his stated age, obese, in NAD. Cardiovascular: Normal rate and rhythm. S1,S2 noted.  No murmur, rubs or gallops noted.  Pulmonary/Chest: Normal effort and positive vesicular breath sounds. No respiratory distress. No wheezes, rales or ronchi noted.  Musculoskeletal: No difficulty with gait.  Neurological: Alert and oriented. Coordination normal.      BMET    Component Value Date/Time   NA 140 07/18/2022 1100   K 3.9 07/18/2022 1100   CL 104 07/18/2022 1100   CO2 23 07/18/2022 1100   GLUCOSE 71 07/18/2022 1100   BUN 10 07/18/2022 1100   CREATININE 0.91 07/18/2022 1100   CALCIUM 9.4 07/18/2022 1100   GFRNONAA 95.90 09/13/2010 1103    Lipid Panel     Component Value Date/Time   CHOL 176 07/18/2022 1100   TRIG 184 (H) 07/18/2022 1100   HDL 45 07/18/2022 1100   CHOLHDL 3.9 07/18/2022 1100   VLDL 51.4 (H) 01/31/2021 1035   LDLCALC 100 (H) 07/18/2022 1100    CBC    Component Value Date/Time   WBC 6.9 01/24/2022 1027   RBC 5.53 01/24/2022 1027   HGB 16.0 01/24/2022 1027   HCT 47.6 01/24/2022 1027   PLT 194 01/24/2022 1027   MCV 86.1 01/24/2022 1027   MCH 28.9 01/24/2022 1027   MCHC 33.6 01/24/2022 1027   RDW 12.9 01/24/2022 1027   LYMPHSABS 1.9 02/27/2016 1032   MONOABS 0.6 02/27/2016 1032   EOSABS 0.1 02/27/2016 1032   BASOSABS 0.0 02/27/2016 1032    Hgb A1C Lab Results  Component Value Date   HGBA1C 5.7 (H) 07/18/2022            Assessment & Plan:      RTC in 3 months for annual exam Webb Silversmith, NP

## 2022-12-03 DIAGNOSIS — G4733 Obstructive sleep apnea (adult) (pediatric): Secondary | ICD-10-CM | POA: Diagnosis not present

## 2022-12-04 DIAGNOSIS — G4733 Obstructive sleep apnea (adult) (pediatric): Secondary | ICD-10-CM | POA: Diagnosis not present

## 2022-12-28 ENCOUNTER — Ambulatory Visit: Payer: Federal, State, Local not specified - PPO | Admitting: Adult Health

## 2023-01-02 ENCOUNTER — Other Ambulatory Visit: Payer: Self-pay | Admitting: Internal Medicine

## 2023-01-03 ENCOUNTER — Other Ambulatory Visit: Payer: Self-pay | Admitting: Internal Medicine

## 2023-01-03 DIAGNOSIS — G4733 Obstructive sleep apnea (adult) (pediatric): Secondary | ICD-10-CM | POA: Diagnosis not present

## 2023-01-03 NOTE — Telephone Encounter (Signed)
Unable to refill per protocol, Rx request is too soon.   Requested Prescriptions  Pending Prescriptions Disp Refills   losartan (COZAAR) 100 MG tablet [Pharmacy Med Name: LOSARTAN POTASSIUM 100 MG TAB] 45 tablet 1    Sig: TAKE 1/2 TABLET BY MOUTH DAILY     Cardiovascular:  Angiotensin Receptor Blockers Passed - 01/03/2023  4:08 PM      Passed - Cr in normal range and within 180 days    Creat  Date Value Ref Range Status  07/18/2022 0.91 0.70 - 1.30 mg/dL Final         Passed - K in normal range and within 180 days    Potassium  Date Value Ref Range Status  07/18/2022 3.9 3.5 - 5.3 mmol/L Final         Passed - Patient is not pregnant      Passed - Last BP in normal range    BP Readings from Last 1 Encounters:  11/08/22 121/76         Passed - Valid encounter within last 6 months    Recent Outpatient Visits           1 month ago RLS (restless legs syndrome)   [facility] [place], Coralie Keens, NP   2 months ago RSV (respiratory syncytial virus infection)   [facility] [place], Coralie Keens, NP   5 months ago Anxiety and depression   [facility] [place], [place] W, NP   8 months ago Bronchiectasis with acute exacerbation [facility])   [facility] [place], Coralie Keens, NP   10 months ago RUQ abdominal pain   [facility] [place], Coralie Keens, NP       Future Appointments             In 3 weeks Parrett, Fonnie Mu, NP [place] [facility]

## 2023-01-03 NOTE — Telephone Encounter (Signed)
Requested Prescriptions  Pending Prescriptions Disp Refills   losartan (COZAAR) 100 MG tablet [Pharmacy Med Name: LOSARTAN POTASSIUM 100 MG TAB] 45 tablet 1    Sig: TAKE 1/2 TABLET BY MOUTH DAILY     Cardiovascular:  Angiotensin Receptor Blockers Passed - 01/02/2023  7:03 PM      Passed - Cr in normal range and within 180 days    Creat  Date Value Ref Range Status  07/18/2022 0.91 0.70 - 1.30 mg/dL Final         Passed - K in normal range and within 180 days    Potassium  Date Value Ref Range Status  07/18/2022 3.9 3.5 - 5.3 mmol/L Final         Passed - Patient is not pregnant      Passed - Last BP in normal range    BP Readings from Last 1 Encounters:  11/08/22 121/76         Passed - Valid encounter within last 6 months    Recent Outpatient Visits           1 month ago RLS (restless legs syndrome)   [facility] [place], Coralie Keens, NP   2 months ago RSV (respiratory syncytial virus infection)   [facility] [place], Coralie Keens, NP   5 months ago Anxiety and depression   [facility] [place], [place] W, NP   8 months ago Bronchiectasis with acute exacerbation [facility])   [facility] [place], Coralie Keens, NP   10 months ago RUQ abdominal pain   [facility] [place], Coralie Keens, NP       Future Appointments             In 3 weeks Parrett, Fonnie Mu, NP [place] [facility]

## 2023-01-04 DIAGNOSIS — G4733 Obstructive sleep apnea (adult) (pediatric): Secondary | ICD-10-CM | POA: Diagnosis not present

## 2023-01-08 ENCOUNTER — Other Ambulatory Visit: Payer: Self-pay | Admitting: Internal Medicine

## 2023-01-08 NOTE — Telephone Encounter (Signed)
Requested by interface surescripts.  Requested Prescriptions  Pending Prescriptions Disp Refills   losartan (COZAAR) 100 MG tablet [Pharmacy Med Name: LOSARTAN POTASSIUM 100 MG TAB] 45 tablet 0    Sig: TAKE 1/2 TABLET BY MOUTH DAILY     Cardiovascular:  Angiotensin Receptor Blockers Passed - 01/08/2023 10:16 AM      Passed - Cr in normal range and within 180 days    Creat  Date Value Ref Range Status  07/18/2022 0.91 0.70 - 1.30 mg/dL Final         Passed - K in normal range and within 180 days    Potassium  Date Value Ref Range Status  07/18/2022 3.9 3.5 - 5.3 mmol/L Final         Passed - Patient is not pregnant      Passed - Last BP in normal range    BP Readings from Last 1 Encounters:  11/08/22 121/76         Passed - Valid encounter within last 6 months    Recent Outpatient Visits           2 months ago RLS (restless legs syndrome)   [facility] [place], Coralie Keens, NP   3 months ago RSV (respiratory syncytial virus infection)   [facility] [place], Coralie Keens, NP   5 months ago Anxiety and depression   [facility] [place], [place] W, NP   8 months ago Bronchiectasis with acute exacerbation [facility])   [facility] [place], Coralie Keens, NP   10 months ago RUQ abdominal pain   [facility] [place], Coralie Keens, NP       Future Appointments             In 2 weeks Parrett, Fonnie Mu, NP [place] [facility] at [facility]

## 2023-01-17 ENCOUNTER — Other Ambulatory Visit: Payer: Self-pay | Admitting: Internal Medicine

## 2023-01-17 NOTE — Telephone Encounter (Signed)
Requested medication (s) are due for refill today: review  Requested medication (s) are on the active medication list: yes  Last refill:  11/08/22   Future visit scheduled: no  Notes to clinic:  historical med, please advise for refill     Requested Prescriptions  Pending Prescriptions Disp Refills   rOPINIRole (REQUIP) 2 MG tablet [Pharmacy Med Name: ROPINIROLE HCL 2 MG TABLET] 270 tablet 1    Sig: TAKE 1 TABLET BY MOUTH THREE TIMES A DAY     Neurology:  Parkinsonian Agents Passed - 01/17/2023  6:46 AM      Passed - Last BP in normal range    BP Readings from Last 1 Encounters:  11/08/22 121/76         Passed - Last Heart Rate in normal range    Pulse Readings from Last 1 Encounters:  11/08/22 82         Passed - Valid encounter within last 12 months    Recent Outpatient Visits           2 months ago RLS (restless legs syndrome)   [facility] [place], Coralie Keens, NP   3 months ago RSV (respiratory syncytial virus infection)   [facility] [place], Coralie Keens, NP   6 months ago Anxiety and depression   [facility] [place], [place], NP   9 months ago Bronchiectasis with acute exacerbation [facility])   [facility] [place], Coralie Keens, NP   10 months ago RUQ abdominal pain   [facility] [place], Coralie Keens, NP       Future Appointments             In 1 week Parrett, Fonnie Mu, NP [place] [facility] at [facility]

## 2023-01-24 ENCOUNTER — Ambulatory Visit: Payer: Federal, State, Local not specified - PPO | Admitting: Adult Health

## 2023-02-01 DIAGNOSIS — G4733 Obstructive sleep apnea (adult) (pediatric): Secondary | ICD-10-CM | POA: Diagnosis not present

## 2023-02-02 DIAGNOSIS — G4733 Obstructive sleep apnea (adult) (pediatric): Secondary | ICD-10-CM | POA: Diagnosis not present

## 2023-02-27 ENCOUNTER — Encounter: Payer: Self-pay | Admitting: Internal Medicine

## 2023-02-27 ENCOUNTER — Ambulatory Visit: Payer: Federal, State, Local not specified - PPO | Admitting: Internal Medicine

## 2023-02-27 VITALS — BP 130/80 | HR 94 | Resp 16 | Ht 69.0 in | Wt 255.6 lb

## 2023-02-27 DIAGNOSIS — Z0001 Encounter for general adult medical examination with abnormal findings: Secondary | ICD-10-CM | POA: Diagnosis not present

## 2023-02-27 DIAGNOSIS — R7303 Prediabetes: Secondary | ICD-10-CM

## 2023-02-27 DIAGNOSIS — I700 Atherosclerosis of aorta: Secondary | ICD-10-CM

## 2023-02-27 DIAGNOSIS — Z125 Encounter for screening for malignant neoplasm of prostate: Secondary | ICD-10-CM | POA: Diagnosis not present

## 2023-02-27 DIAGNOSIS — Z6837 Body mass index (BMI) 37.0-37.9, adult: Secondary | ICD-10-CM

## 2023-02-27 MED ORDER — ALPRAZOLAM 0.25 MG PO TABS: 0.2500 mg | ORAL_TABLET | Freq: Every day | ORAL | 0 refills | Status: DC | PRN

## 2023-02-27 MED ORDER — ROPINIROLE HCL 2 MG PO TABS: 2.0000 mg | ORAL_TABLET | Freq: Four times a day (QID) | ORAL | 0 refills | Status: DC

## 2023-02-27 NOTE — Assessment & Plan Note (Signed)
Encouraged diet and exercise for weight loss ?

## 2023-02-27 NOTE — Progress Notes (Signed)
Subjective:    Patient ID: Jonathan [name], male    DOB: [birth year], 52 y.o.   MRN: [id number]  HPI  Patient presents to clinic today for his annual exam.  Flu: Never Tetanus: 02/2016 COVID: Moderna x 3 Shingrix: Never PSA screening: 01/2022 Colon screening: 11/2021 Vision screening: as needed Dentist: biannually  Diet: He does eat meat. He consumes fruits and veggies. He does eat some fried foods. He drinks mostly water. Exercise: Walking  Review of Systems     Past Medical History:  Diagnosis Date   Anxiety    GERD (gastroesophageal reflux disease)    Hypertension     Current Outpatient Medications  Medication Sig Dispense Refill   albuterol (VENTOLIN HFA) 108 (90 Base) MCG/ACT inhaler INHALE 2 PUFFS BY MOUTH EVERY 6 HOURS AS NEEDED 18 each 2   ALPRAZolam (XANAX) 0.25 MG tablet Take 1 tablet (0.25 mg total) by mouth daily as needed for anxiety. 10 tablet 0   aspirin EC 81 MG tablet Take 1 tablet (81 mg total) by mouth daily. Swallow whole. 30 tablet 12   baclofen (LIORESAL) 10 MG tablet TAKE 1 TABLET BY MOUTH THREE TIMES A DAY AS NEEDED FOR MUSCLE SPASM 30 tablet 0   budesonide-formoterol (SYMBICORT) 160-4.5 MCG/ACT inhaler Inhale into the lungs.     citalopram (CELEXA) 40 MG tablet TAKE 1 TABLET BY MOUTH EVERY DAY 90 tablet 1   hydrochlorothiazide (MICROZIDE) 12.5 MG capsule TAKE 1 CAPSULE BY MOUTH EVERY DAY 90 capsule 1   ipratropium (ATROVENT) 0.02 % nebulizer solution Inhale into the lungs.     losartan (COZAAR) 100 MG tablet TAKE 1/2 TABLET BY MOUTH DAILY 45 tablet 0   Multiple Vitamin (MULTIVITAMIN) tablet Take 1 tablet by mouth daily.     omeprazole (PRILOSEC) 40 MG capsule TAKE 1 CAPSULE BY MOUTH EVERY DAY 90 capsule 3   rOPINIRole (REQUIP) 2 MG tablet TAKE 1 TABLET BY MOUTH THREE TIMES A DAY 270 tablet 0   No current facility-administered medications for this visit.    No Known Allergies  Family History  Problem Relation Age of Onset   Breast cancer  Mother    Hypertension Mother    Prostate cancer Father     Social History   Socioeconomic History   Marital status: Married    Spouse name: Not on file   Number of children: 0   Years of education: Not on file   Highest education level: Not on file  Occupational History   Not on file  Tobacco Use   Smoking status: Former    Types: Cigarettes    Quit date: 1980    Years since quitting: 44.3   Smokeless tobacco: Current    Types: Chew  Vaping Use   Vaping Use: Never used  Substance and Sexual Activity   Alcohol use: No    Alcohol/week: 0.0 standard drinks of alcohol   Drug use: Never   Sexual activity: Not on file  Other Topics Concern   Not on file  Social History Narrative   Not on file   Social Determinants of Health   Financial Resource Strain: Not on file  Food Insecurity: Not on file  Transportation Needs: Not on file  Physical Activity: Not on file  Stress: Not on file  Social Connections: Not on file  Intimate Partner Violence: Not on file     Constitutional: Denies fever, malaise, fatigue, headache or abrupt weight changes.  HEENT: Pt reports throat tightness. Denies eye pain, eye  redness, ear pain, ringing in the ears, wax buildup, runny nose, nasal congestion, bloody nose, or sore throat. Respiratory: Denies difficulty breathing, shortness of breath, cough or sputum production.   Cardiovascular: Denies chest pain, chest tightness, palpitations or swelling in the hands or feet.  Gastrointestinal: Denies abdominal pain, bloating, constipation, diarrhea or blood in the stool.  GU: Denies urgency, frequency, pain with urination, burning sensation, blood in urine, odor or discharge. Musculoskeletal: Denies decrease in range of motion, difficulty with gait, muscle pain or joint pain and swelling.  Skin: Denies redness, rashes, lesions or ulcercations.  Neurological: Patient reports restless legs.  Denies dizziness, difficulty with memory, difficulty with  speech or problems with balance and coordination.  Psych: Patient has a history of anxiety and depression.  Denies SI/HI.  No other specific complaints in a complete review of systems (except as listed in HPI above).  Objective:   Physical Exam   BP 130/80 (BP Location: Right Arm, Patient Position: Sitting, Cuff Size: Normal)   Pulse 94   Resp 16   Ht  (1.753 m)   Wt 255 lb 9.6 oz (115.9 kg)   BMI 37.75 kg/m   Wt Readings from Last 3 Encounters:  11/08/22 260 lb 12.8 oz (118.3 kg)  10/08/22 252 lb (114.3 kg)  08/27/22 248 lb 9.6 oz (112.8 kg)    General: Appears his stated age, obese, in NAD. Skin: Warm, dry and intact. No ulcerations noted. HEENT: Head: normal shape and size; Eyes: sclera white, no icterus, conjunctiva pink, PERRLA and EOMs intact;    Neck:  Neck supple, trachea midline. No masses, lumps or thyromegaly present.  Cardiovascular: Normal rate and rhythm. S1,S2 noted.  No murmur, rubs or gallops noted. No JVD or BLE edema. No carotid bruits noted. Pulmonary/Chest: Normal effort and positive vesicular breath sounds. No respiratory distress. No wheezes, rales or ronchi noted.  Abdomen: Normal bowel sounds.  Musculoskeletal: Strength 5/5 BUE/BLE. No difficulty with gait.  Neurological: Alert and oriented. Cranial nerves II-XII grossly intact. Coordination normal.  Psychiatric: Mood and affect normal. Behavior is normal. Judgment and thought content normal.    BMET    Component Value Date/Time   NA 140 07/18/2022 1100   K 3.9 07/18/2022 1100   CL 104 07/18/2022 1100   CO2 23 07/18/2022 1100   GLUCOSE 71 07/18/2022 1100   BUN 10 07/18/2022 1100   CREATININE 0.91 07/18/2022 1100   CALCIUM 9.4 07/18/2022 1100   GFRNONAA 95.90 09/13/2010 1103    Lipid Panel     Component Value Date/Time   CHOL 176 07/18/2022 1100   TRIG 184 (H) 07/18/2022 1100   HDL 45 07/18/2022 1100   CHOLHDL 3.9 07/18/2022 1100   VLDL 51.4 (H) 01/31/2021 1035   LDLCALC 100 (H)  07/18/2022 1100    CBC    Component Value Date/Time   WBC 6.9 01/24/2022 1027   RBC 5.53 01/24/2022 1027   HGB 16.0 01/24/2022 1027   HCT 47.6 01/24/2022 1027   PLT 194 01/24/2022 1027   MCV 86.1 01/24/2022 1027   MCH 28.9 01/24/2022 1027   MCHC 33.6 01/24/2022 1027   RDW 12.9 01/24/2022 1027   LYMPHSABS 1.9 02/27/2016 1032   MONOABS 0.6 02/27/2016 1032   EOSABS 0.1 02/27/2016 1032   BASOSABS 0.0 02/27/2016 1032    Hgb A1C Lab Results  Component Value Date   HGBA1C 5.7 (H) 07/18/2022           Assessment & Plan:   Preventative Health  Maintenance:  Encouraged him to get a flu shot in the fall Tetanus UTD COVID-vaccine UTD Discussed Shingrix vaccine, he will check coverage with his insurance company schedule visit if you would like to have this done Colon screening UTD Encouraged him to consume a balanced diet and exercise regimen Advised him to see an eye doctor and dentist annually We will check CBC, c-Met, lipid, A1c and PSA today  RTC in 6 months, follow-up chronic conditions Nicki Reaper, NP

## 2023-02-27 NOTE — Assessment & Plan Note (Signed)
Advised him to start taking baby aspirin

## 2023-02-27 NOTE — Patient Instructions (Signed)
Health Maintenance, Male Adopting a healthy lifestyle and getting preventive care are important in promoting health and wellness. Ask your health care provider about: The right schedule for you to have regular tests and exams. Things you can do on your own to prevent diseases and keep yourself healthy. What should I know about diet, weight, and exercise? Eat a healthy diet  Eat a diet that includes plenty of vegetables, fruits, low-fat dairy products, and lean protein. Do not eat a lot of foods that are high in solid fats, added sugars, or sodium. Maintain a healthy weight Body mass index (BMI) is a measurement that can be used to identify possible weight problems. It estimates body fat based on height and weight. Your health care provider can help determine your BMI and help you achieve or maintain a healthy weight. Get regular exercise Get regular exercise. This is one of the most important things you can do for your health. Most adults should: Exercise for at least 150 minutes each week. The exercise should increase your heart rate and make you sweat (moderate-intensity exercise). Do strengthening exercises at least twice a week. This is in addition to the moderate-intensity exercise. Spend less time sitting. Even light physical activity can be beneficial. Watch cholesterol and blood lipids Have your blood tested for lipids and cholesterol at 52 years of age, then have this test every 5 years. You may need to have your cholesterol levels checked more often if: Your lipid or cholesterol levels are high. You are older than 52 years of age. You are at high risk for heart disease. What should I know about cancer screening? Many types of cancers can be detected early and may often be prevented. Depending on your health history and family history, you may need to have cancer screening at various ages. This may include screening for: Colorectal cancer. Prostate cancer. Skin cancer. Lung  cancer. What should I know about heart disease, diabetes, and high blood pressure? Blood pressure and heart disease High blood pressure causes heart disease and increases the risk of stroke. This is more likely to develop in people who have high blood pressure readings or are overweight. Talk with your health care provider about your target blood pressure readings. Have your blood pressure checked: Every 3-5 years if you are 18-39 years of age. Every year if you are 40 years old or older. If you are between the ages of 65 and 75 and are a current or former smoker, ask your health care provider if you should have a one-time screening for abdominal aortic aneurysm (AAA). Diabetes Have regular diabetes screenings. This checks your fasting blood sugar level. Have the screening done: Once every three years after age 45 if you are at a normal weight and have a low risk for diabetes. More often and at a younger age if you are overweight or have a high risk for diabetes. What should I know about preventing infection? Hepatitis B If you have a higher risk for hepatitis B, you should be screened for this virus. Talk with your health care provider to find out if you are at risk for hepatitis B infection. Hepatitis C Blood testing is recommended for: Everyone born from 1945 through 1965. Anyone with known risk factors for hepatitis C. Sexually transmitted infections (STIs) You should be screened each year for STIs, including gonorrhea and chlamydia, if: You are sexually active and are younger than 52 years of age. You are older than 52 years of age and your   health care provider tells you that you are at risk for this type of infection. Your sexual activity has changed since you were last screened, and you are at increased risk for chlamydia or gonorrhea. Ask your health care provider if you are at risk. Ask your health care provider about whether you are at high risk for HIV. Your health care provider  may recommend a prescription medicine to help prevent HIV infection. If you choose to take medicine to prevent HIV, you should first get tested for HIV. You should then be tested every 3 months for as long as you are taking the medicine. Follow these instructions at home: Alcohol use Do not drink alcohol if your health care provider tells you not to drink. If you drink alcohol: Limit how much you have to 0-2 drinks a day. Know how much alcohol is in your drink. In the [place] one drink equals one 12 oz bottle of beer (355 mL), one 5 oz glass of wine (148 mL), or one 1 oz glass of hard liquor (44 mL). Lifestyle Do not use any products that contain nicotine or tobacco. These products include cigarettes, chewing tobacco, and vaping devices, such as e-cigarettes. If you need help quitting, ask your health care provider. Do not use street drugs. Do not share needles. Ask your health care provider for help if you need support or information about quitting drugs. General instructions Schedule regular health, dental, and eye exams. Stay current with your vaccines. Tell your health care provider if: You often feel depressed. You have ever been abused or do not feel safe at home. Summary Adopting a healthy lifestyle and getting preventive care are important in promoting health and wellness. Follow your health care provider's instructions about healthy diet, exercising, and getting tested or screened for diseases. Follow your health care provider's instructions on monitoring your cholesterol and blood pressure. This information is not intended to replace advice given to you by your health care provider. Make sure you discuss any questions you have with your health care provider. Document Revised: 03/20/2021 Document Reviewed: 03/20/2021 Elsevier Patient Education  2023 Elsevier Inc.  

## 2023-02-28 LAB — LIPID PANEL
Cholesterol: 152 mg/dL (ref <200)
HDL: 38 mg/dL — ABNORMAL LOW (ref >=40)
LDL Cholesterol (Calc): 90 mg/dL (calc)
Non-HDL Cholesterol (Calc): 114 mg/dL (calc) (ref <130)
Total CHOL/HDL Ratio: 4 (calc) (ref <5.0)
Triglycerides: 142 mg/dL (ref <150)

## 2023-02-28 LAB — CBC
HCT: 45 % (ref 38.5–50.0)
Hemoglobin: 15.2 g/dL (ref 13.2–17.1)
MCH: 28.7 pg (ref 27.0–33.0)
MCHC: 33.8 g/dL (ref 32.0–36.0)
MCV: 85.1 fL (ref 80.0–100.0)
MPV: 10 fL (ref 7.5–12.5)
Platelets: 194 10*3/uL (ref 140–400)
RBC: 5.29 10*6/uL (ref 4.20–5.80)
RDW: 12.7 % (ref 11.0–15.0)
WBC: 6.7 10*3/uL (ref 3.8–10.8)

## 2023-02-28 LAB — COMPLETE METABOLIC PANEL WITH GFR
AG Ratio: 2 (calc) (ref 1.0–2.5)
ALT: 28 U/L (ref 9–46)
AST: 19 U/L (ref 10–35)
Albumin: 4.3 g/dL (ref 3.6–5.1)
Alkaline phosphatase (APISO): 75 U/L (ref 35–144)
BUN: 9 mg/dL (ref 7–25)
CO2: 26 mmol/L (ref 20–32)
Calcium: 9 mg/dL (ref 8.6–10.3)
Chloride: 103 mmol/L (ref 98–110)
Creat: 1.02 mg/dL (ref 0.70–1.30)
Globulin: 2.2 g/dL (calc) (ref 1.9–3.7)
Glucose, Bld: 97 mg/dL (ref 65–139)
Potassium: 4 mmol/L (ref 3.5–5.3)
Sodium: 138 mmol/L (ref 135–146)
Total Bilirubin: 0.6 mg/dL (ref 0.2–1.2)
Total Protein: 6.5 g/dL (ref 6.1–8.1)
eGFR: 89 mL/min/{1.73_m2} (ref >=60)

## 2023-02-28 LAB — HEMOGLOBIN A1C
Hgb A1c MFr Bld: 6 % of total Hgb — ABNORMAL HIGH (ref <5.7)
Mean Plasma Glucose: 126 mg/dL
eAG (mmol/L): 7 mmol/L

## 2023-02-28 LAB — PSA: PSA: 0.8 ng/mL (ref <=4.00)

## 2023-03-04 DIAGNOSIS — G4733 Obstructive sleep apnea (adult) (pediatric): Secondary | ICD-10-CM | POA: Diagnosis not present

## 2023-03-07 ENCOUNTER — Ambulatory Visit: Payer: Federal, State, Local not specified - PPO | Admitting: Adult Health

## 2023-03-31 ENCOUNTER — Other Ambulatory Visit: Payer: Self-pay | Admitting: Internal Medicine

## 2023-04-02 NOTE — Telephone Encounter (Signed)
Requested Prescriptions  Pending Prescriptions Disp Refills   losartan (COZAAR) 100 MG tablet [Pharmacy Med Name: LOSARTAN POTASSIUM 100 MG TAB] 45 tablet 0    Sig: TAKE 1/2 TABLET BY MOUTH DAILY     Cardiovascular:  Angiotensin Receptor Blockers Passed - 03/31/2023  9:42 PM      Passed - Cr in normal range and within 180 days    Creat  Date Value Ref Range Status  02/27/2023 1.02 0.70 - 1.30 mg/dL Final         Passed - K in normal range and within 180 days    Potassium  Date Value Ref Range Status  02/27/2023 4.0 3.5 - 5.3 mmol/L Final         Passed - Patient is not pregnant      Passed - Last BP in normal range    BP Readings from Last 1 Encounters:  02/27/23 130/80         Passed - Valid encounter within last 6 months    Recent Outpatient Visits           1 month ago Encounter for general adult medical examination with abnormal findings   [place] [facility] [place], Salvadore Oxford, NP   4 months ago RLS (restless legs syndrome)   [place] [facility] [place], Salvadore Oxford, NP   5 months ago RSV (respiratory syncytial virus infection)   [place] [facility] [place], Salvadore Oxford, NP   8 months ago Anxiety and depression   [place] [place], [place] W, NP   11 months ago Bronchiectasis with acute exacerbation [facility])   [place] [facility] [place], Salvadore Oxford, NP       Future Appointments             In 1 week Parrett, Virgel Bouquet, NP [place] [facility] [place]   In 4 months Baity, Salvadore Oxford, NP [place] [facility]             rOPINIRole (REQUIP) 2 MG tablet [Pharmacy Med Name: ROPINIROLE HCL 2 MG TABLET] 270 tablet     Sig: TAKE 1 TABLET BY MOUTH THREE TIMES A DAY     Neurology:  Parkinsonian Agents Passed - 03/31/2023  9:42 PM      Passed - Last BP in normal range    BP Readings from Last 1 Encounters:  02/27/23 130/80          Passed - Last Heart Rate in normal range    Pulse Readings from Last 1 Encounters:  02/27/23 94         Passed - Valid encounter within last 12 months    Recent Outpatient Visits           1 month ago Encounter for general adult medical examination with abnormal findings   [place] [facility] [place], [place], NP   4 months ago RLS (restless legs syndrome)   [place] [facility] [place], Salvadore Oxford, NP   5 months ago RSV (respiratory syncytial virus infection)   [facility] [place], Salvadore Oxford, NP   8 months ago Anxiety and depression   [place] [facility] [place], Salvadore Oxford, NP   11 months ago Bronchiectasis with acute exacerbation [facility])   [place] [facility] [place], Salvadore Oxford, [place]  Future Appointments             In 1 week Parrett, Virgel Bouquet, NP [facility] [place]   In 4 months Baity, Salvadore Oxford, NP [facility]             citalopram (CELEXA) 40 MG tablet [Pharmacy Med Name: CITALOPRAM HBR 40 MG TABLET] 90 tablet 1    Sig: TAKE 1 TABLET BY MOUTH EVERY DAY     Psychiatry:  Antidepressants - SSRI Passed - 03/31/2023  9:42 PM      Passed - Completed PHQ-2 or PHQ-9 in the last 360 days      Passed - Valid encounter within last 6 months    Recent Outpatient Visits           1 month ago Encounter for general adult medical examination with abnormal findings   [place] [facility] [place], Salvadore Oxford, NP   4 months ago RLS (restless legs syndrome)   [place] [facility] [place], Salvadore Oxford, NP   5 months ago RSV (respiratory syncytial virus infection)   [place] [facility] [place], Salvadore Oxford, NP   8 months ago Anxiety and depression   [place] [facility] [place], [place], NP   11 months ago Bronchiectasis with acute exacerbation [facility])   [place]  [facility] [place], Salvadore Oxford, NP       Future Appointments             In 1 week Parrett, Virgel Bouquet, NP  [place] [facility] [place]   In 4 months Baity, Salvadore Oxford, NP [facility]

## 2023-04-03 DIAGNOSIS — G4733 Obstructive sleep apnea (adult) (pediatric): Secondary | ICD-10-CM | POA: Diagnosis not present

## 2023-04-05 ENCOUNTER — Telehealth: Payer: Self-pay | Admitting: Internal Medicine

## 2023-04-05 NOTE — Telephone Encounter (Signed)
He is not currently on this medication and I do not see that this is ever been prescribed for him.

## 2023-04-05 NOTE — Telephone Encounter (Signed)
Medication Refill - Medication: Duloxetine (Cymbalta)  Did not see in the patients chart. Pt states that the sig states 3 per day. Discussed taking up to 4 per day.   Has the patient contacted their pharmacy? Yes.   (Agent: If no, request that the patient contact the pharmacy for the refill. If patient does not wish to contact the pharmacy document the reason why and proceed with request.) (Agent: If yes, when and what did the pharmacy advise?)  Preferred Pharmacy (with phone number or street name):  [id number] [place], [place] - [address] Phone: [phone]  Fax: [phone]     Has the patient been seen for an appointment in the last year OR does the patient have an upcoming appointment? Yes.    Agent: Please be advised that RX refills may take up to 3 business days. We ask that you follow-up with your pharmacy.

## 2023-04-09 ENCOUNTER — Other Ambulatory Visit: Payer: Self-pay | Admitting: Internal Medicine

## 2023-04-10 ENCOUNTER — Ambulatory Visit: Payer: Federal, State, Local not specified - PPO | Admitting: Adult Health

## 2023-04-10 ENCOUNTER — Encounter: Payer: Self-pay | Admitting: *Deleted

## 2023-04-10 ENCOUNTER — Encounter: Payer: Self-pay | Admitting: Adult Health

## 2023-04-10 VITALS — BP 110/68 | HR 91 | Ht 69.0 in | Wt 255.8 lb

## 2023-04-10 DIAGNOSIS — R590 Localized enlarged lymph nodes: Secondary | ICD-10-CM | POA: Diagnosis not present

## 2023-04-10 DIAGNOSIS — J4540 Moderate persistent asthma, uncomplicated: Secondary | ICD-10-CM

## 2023-04-10 DIAGNOSIS — G2581 Restless legs syndrome: Secondary | ICD-10-CM

## 2023-04-10 DIAGNOSIS — G4719 Other hypersomnia: Secondary | ICD-10-CM | POA: Diagnosis not present

## 2023-04-10 DIAGNOSIS — G4733 Obstructive sleep apnea (adult) (pediatric): Secondary | ICD-10-CM | POA: Diagnosis not present

## 2023-04-10 DIAGNOSIS — R9389 Abnormal findings on diagnostic imaging of other specified body structures: Secondary | ICD-10-CM | POA: Insufficient documentation

## 2023-04-10 NOTE — Assessment & Plan Note (Signed)
Severe OSA - unable to tolerate CPAP . Set up for Titration study , if unable to tolerate CPAP after this . Consider referral to ENT for INSPIRE evaluation   Plan  Patient Instructions  Set up for CPAP titration study .  Change CPAP pressure to Auto CPAP 10 to 18cm.  Continue on CPAP at bedtime, wear all night long, need to get in 6 hrs or more. Use with naps.  Try Dream wear nasal mask.  Work on healthy weight loss Do not drive if sleepy Saline nasal spray Twice daily   Saline nasal gel At bedtime    Continue Zyrtec 10mg  daily As needed  drainage .  Continue on Symbicort 160 2 puffs Twice daily  , rinse after use.  Albuterol inhaler  As needed  for shortness of breath /wheezing  (this is your rescue inhaler)   CT Chest to follow up lung scarring and mediastinal adenopathy.   Continue on Requip.  Check labs today -iron panel .    Follow-up in 3 months with Dr. Wynona Neat or Kylo Gavin NP  Please contact office for sooner follow up if symptoms do not improve or worsen or seek emergency care

## 2023-04-10 NOTE — Assessment & Plan Note (Signed)
Cont on Requip . Check iron panel .  Control OSA better, CPAP titration study pending   Plan  Patient Instructions  Set up for CPAP titration study .  Change CPAP pressure to Auto CPAP 10 to 18cm.  Continue on CPAP at bedtime, wear all night long, need to get in 6 hrs or more. Use with naps.  Try Dream wear nasal mask.  Work on healthy weight loss Do not drive if sleepy Saline nasal spray Twice daily   Saline nasal gel At bedtime    Continue Zyrtec 10mg  daily As needed  drainage .  Continue on Symbicort 160 2 puffs Twice daily  , rinse after use.  Albuterol inhaler  As needed  for shortness of breath /wheezing  (this is your rescue inhaler)   CT Chest to follow up lung scarring and mediastinal adenopathy.   Continue on Requip.  Check labs today -iron panel .    Follow-up in 3 months with Dr. Wynona Neat or Anilah Huck NP  Please contact office for sooner follow up if symptoms do not improve or worsen or seek emergency care

## 2023-04-10 NOTE — Assessment & Plan Note (Signed)
Bibasilar scarring, minor bronchiectasis and mediastinal lymph node noted on HRCT chest 2023. Check Serial CT chest for stability.

## 2023-04-10 NOTE — Assessment & Plan Note (Signed)
Well controlled on regimen . Control for triggers   Plan  Patient Instructions  Set up for CPAP titration study .  Change CPAP pressure to Auto CPAP 10 to 18cm.  Continue on CPAP at bedtime, wear all night long, need to get in 6 hrs or more. Use with naps.  Try Dream wear nasal mask.  Work on healthy weight loss Do not drive if sleepy Saline nasal spray Twice daily   Saline nasal gel At bedtime    Continue Zyrtec 10mg  daily As needed  drainage .  Continue on Symbicort 160 2 puffs Twice daily  , rinse after use.  Albuterol inhaler  As needed  for shortness of breath /wheezing  (this is your rescue inhaler)   CT Chest to follow up lung scarring and mediastinal adenopathy.   Continue on Requip.  Check labs today -iron panel .    Follow-up in 3 months with Dr. Wynona Neat or Puneet Selden NP  Please contact office for sooner follow up if symptoms do not improve or worsen or seek emergency care

## 2023-04-10 NOTE — Patient Instructions (Addendum)
Set up for CPAP titration study .  Change CPAP pressure to Auto CPAP 10 to 18cm.  Continue on CPAP at bedtime, wear all night long, need to get in 6 hrs or more. Use with naps.  Try Dream wear nasal mask.  Work on healthy weight loss Do not drive if sleepy Saline nasal spray Twice daily   Saline nasal gel At bedtime    Continue Zyrtec 10mg  daily As needed  drainage .  Continue on Symbicort 160 2 puffs Twice daily  , rinse after use.  Albuterol inhaler  As needed  for shortness of breath /wheezing  (this is your rescue inhaler)   CT Chest to follow up lung scarring and mediastinal adenopathy.   Continue on Requip.  Check labs today -iron panel .    Follow-up in 3 months with Dr. Wynona Neat or Otha Rickles NP  Please contact office for sooner follow up if symptoms do not improve or worsen or seek emergency care

## 2023-04-10 NOTE — Telephone Encounter (Signed)
Requested medication (s) are due for refill today: routing for review  Requested medication (s) are on the active medication list: yes  Last refill:  02/27/23  Future visit scheduled: yes  Notes to clinic:  Rx was refused due to being too soon. Last refill 02/27/23 for 90 days, order Class: Phone In. Routing for review.     Requested Prescriptions  Pending Prescriptions Disp Refills   rOPINIRole (REQUIP) 2 MG tablet [Pharmacy Med Name: ROPINIROLE HCL 2 MG TABLET] 270 tablet     Sig: TAKE 1 TABLET BY MOUTH THREE TIMES A DAY     Neurology:  Parkinsonian Agents Passed - 04/09/2023  6:27 PM      Passed - Last BP in normal range    BP Readings from Last 1 Encounters:  04/10/23 110/68         Passed - Last Heart Rate in normal range    Pulse Readings from Last 1 Encounters:  04/10/23 91         Passed - Valid encounter within last 12 months    Recent Outpatient Visits           1 month ago Encounter for general adult medical examination with abnormal findings   [place] [facility] [place], Salvadore Oxford, NP   5 months ago RLS (restless legs syndrome)   [place] [facility] [place], Salvadore Oxford, NP   6 months ago RSV (respiratory syncytial virus infection)   [place] [facility] [place], Salvadore Oxford, NP   8 months ago Anxiety and depression   [place] [facility] [place], [place], NP   11 months ago Bronchiectasis with acute exacerbation [facility])    [facility] [place], Salvadore Oxford, NP       Future Appointments             In 4 months Baity, Salvadore Oxford, NP  [facility]

## 2023-04-10 NOTE — Progress Notes (Signed)
@Patient  ID: Jonathan [name], male    DOB: Mar 31, 1971, 52 y.o.   MRN: [id number]  Chief Complaint  Patient presents with   Follow-up    Referring provider: Lorre Munroe, NP  HPI: 52 year old male minimal smoking (teenager only) seen for consult January 26, 2022 and found to have severe sleep apnea and chronic cough since January 2023 consistent with asthma Has restless leg syndrome managed by PCP  Works at [employer]   TEST/EVENTS :  02/26/22 HST Sleep study shows severe sleep apnea with AHI at 62.8/hour and SPO2 low at 79%    Chest x-ray February 19, 2022 showed left basilar linear opacities likely subsegmental atelectasis   pulmonary function testing -  This showed significant airway reversibility.  FEV1 81%, ratio 71, FVC 88%.,  67% bronchodilator change.  DLCO was 104%.  03/2022 [facility]) High-resolution CT chest showed no evidence of interstitial lung disease.  There was fairly significant bibasilar atelectasis/scarring with a minor element of bronchiectasis.  Likely to reflect sequela of infection/aspiration.  ILD considered less likely .  Nonspecific 8 mm soft tissue lymph node in the anterior mediastinum. Rib fractures   04/10/2023 Follow up: OSA and Asthma  Patient returns for 36-month follow-up.  Patient has underlying severe sleep apnea.  He has been started on CPAP therapy.  Patient says he has been having trouble tolerating his CPAP.  Feels that the full face mask is uncomfortable.and leaks.  Just feels it is uncomfortable. CPAP download shows poor compliance with daily average usage at 1.5hr. on CPAP auto set 8 to 16cmH2O. , AHI 9/hr .   Has Restless leg syndrome on Requip Four times a day  . Still has symptoms intermittently. Tried Gabapentin but unable to tolerate.Managed by PCP .   Has history of chronic cough and recurrent bronchitis since 11/2021. Workup showed HRCT chest neg for ILD, Bibasilar scarring and minor bronchiectasis ? Post infectious scarring. Nonspecific  mediastinal lymph node with recommendations for follow up CT chest in 1year. PFT showed severe pre bronchodilator airflow obstruction with significant reversibility. He was started on Symbicort with significant improvement . Says cough is much better and wheezing has resolved. Has had decreased episodes of bronchitis since starting on Symbicort. No increased albuterol use.       No Known Allergies  Immunization History  Administered Date(s) Administered   Moderna Sars-Covid-2 Vaccination 03/07/2020, 03/28/2020, 09/16/2020   Td 08/25/2004   Tdap 02/27/2016    Past Medical History:  Diagnosis Date   Anxiety    GERD (gastroesophageal reflux disease)    Hypertension     Tobacco History: Social History   Tobacco Use  Smoking Status Former   Types: Cigarettes   Quit date: 1980   Years since quitting: 44.4  Smokeless Tobacco Current   Types: Chew   Ready to quit: Not Answered Counseling given: Not Answered   Outpatient Medications Prior to Visit  Medication Sig Dispense Refill   albuterol (VENTOLIN HFA) 108 (90 Base) MCG/ACT inhaler INHALE 2 PUFFS BY MOUTH EVERY 6 HOURS AS NEEDED 18 each 2   aspirin EC 81 MG tablet Take 1 tablet (81 mg total) by mouth daily. Swallow whole. 30 tablet 12   citalopram (CELEXA) 40 MG tablet TAKE 1 TABLET BY MOUTH EVERY DAY 90 tablet 0   hydrochlorothiazide (MICROZIDE) 12.5 MG capsule TAKE 1 CAPSULE BY MOUTH EVERY DAY 90 capsule 1   losartan (COZAAR) 100 MG tablet TAKE 1/2 TABLET BY MOUTH DAILY 45 tablet 0   Multiple  Vitamin (MULTIVITAMIN) tablet Take 1 tablet by mouth daily.     omeprazole (PRILOSEC) 40 MG capsule TAKE 1 CAPSULE BY MOUTH EVERY DAY 90 capsule 3   rOPINIRole (REQUIP) 2 MG tablet Take 1 tablet (2 mg total) by mouth 4 (four) times daily. 360 tablet 0   ALPRAZolam (XANAX) 0.25 MG tablet Take 1 tablet (0.25 mg total) by mouth daily as needed for anxiety. (Patient not taking: Reported on 04/10/2023) 10 tablet 0   budesonide-formoterol  (SYMBICORT) 160-4.5 MCG/ACT inhaler Inhale into the lungs.     ipratropium (ATROVENT) 0.02 % nebulizer solution Inhale into the lungs.     No facility-administered medications prior to visit.     Review of Systems:   Constitutional:   No  weight loss, night sweats,  Fevers, chills, + fatigue, or  lassitude.  HEENT:   No headaches,  Difficulty swallowing,  Tooth/dental problems, or  Sore throat,                No sneezing, itching, ear ache, + nasal congestion, post nasal drip,   CV:  No chest pain,  Orthopnea, PND, swelling in lower extremities, anasarca, dizziness, palpitations, syncope.   GI  No heartburn, indigestion, abdominal pain, nausea, vomiting, diarrhea, change in bowel habits, loss of appetite, bloody stools.   Resp: No shortness of breath with exertion or at rest.  No excess mucus, no productive cough,  No non-productive cough,  No coughing up of blood.  No change in color of mucus.  No wheezing.  No chest wall deformity  Skin: no rash or lesions.  GU: no dysuria, change in color of urine, no urgency or frequency.  No flank pain, no hematuria   MS:  No joint pain or swelling.  No decreased range of motion.  No back pain.    Physical Exam  BP 110/68 (BP Location: Left Arm, Patient Position: Sitting, Cuff Size: Large)   Pulse 91   Ht 5\' 9"  (1.753 m)   Wt 255 lb 12.8 oz (116 kg)   SpO2 98%   BMI 37.78 kg/m   GEN: A/Ox3; pleasant , NAD, well nourished    HEENT:  [place]/AT,  EACs-clear, TMs-wnl, NOSE-clear, THROAT-clear, no lesions, no postnasal drip or exudate noted.   NECK:  Supple w/ fair ROM; no JVD; normal carotid impulses w/o bruits; no thyromegaly or nodules palpated; no lymphadenopathy.    RESP  Clear  P & A; w/o, wheezes/ rales/ or rhonchi. no accessory muscle use, no dullness to percussion  CARD:  RRR, no m/r/g, no peripheral edema, pulses intact, no cyanosis or clubbing.  GI:   Soft & nt; nml bowel sounds; no organomegaly or masses detected.   Musco:  Warm bil, no deformities or joint swelling noted.   Neuro: alert, no focal deficits noted.    Skin: Warm, no lesions or rashes    Lab Results:  CBC    Component Value Date/Time   WBC 6.7 02/27/2023 1044   RBC 5.29 02/27/2023 1044   HGB 15.2 02/27/2023 1044   HCT 45.0 02/27/2023 1044   PLT 194 02/27/2023 1044   MCV 85.1 02/27/2023 1044   MCH 28.7 02/27/2023 1044   MCHC 33.8 02/27/2023 1044   RDW 12.7 02/27/2023 1044   LYMPHSABS 1.9 02/27/2016 1032   MONOABS 0.6 02/27/2016 1032   EOSABS 0.1 02/27/2016 1032   BASOSABS 0.0 02/27/2016 1032    BMET    Component Value Date/Time   NA 138 02/27/2023 1044   K 4.0  02/27/2023 1044   CL 103 02/27/2023 1044   CO2 26 02/27/2023 1044   GLUCOSE 97 02/27/2023 1044   BUN 9 02/27/2023 1044   CREATININE 1.02 02/27/2023 1044   CALCIUM 9.0 02/27/2023 1044   GFRNONAA 95.90 09/13/2010 1103    BNP No results found for: "BNP"  ProBNP No results found for: "PROBNP"  Imaging: No results found.       Latest Ref Rng & Units 05/22/2022    9:50 AM  PFT Results  FVC-Pre L 3.24   FVC-Predicted Pre % 66   FVC-Post L 4.31   FVC-Predicted Post % 88   Pre FEV1/FVC % % 56   Post FEV1/FCV % % 71   FEV1-Pre L 1.83   FEV1-Predicted Pre % 48   FEV1-Post L 3.07   DLCO uncorrected ml/min/mmHg 29.71   DLCO UNC% % 104   DLCO corrected ml/min/mmHg 29.71   DLCO COR %Predicted % 104   DLVA Predicted % 100   TLC L 7.87   TLC % Predicted % 116   RV % Predicted % 164     No results found for: "NITRICOXIDE"      Assessment & Plan:   Asthma Well controlled on regimen . Control for triggers   Plan  Patient Instructions  Set up for CPAP titration study .  Change CPAP pressure to Auto CPAP 10 to 18cm.  Continue on CPAP at bedtime, wear all night long, need to get in 6 hrs or more. Use with naps.  Try Dream wear nasal mask.  Work on healthy weight loss Do not drive if sleepy Saline nasal spray Twice daily   Saline nasal gel At  bedtime    Continue Zyrtec 10mg  daily As needed  drainage .  Continue on Symbicort 160 2 puffs Twice daily  , rinse after use.  Albuterol inhaler  As needed  for shortness of breath /wheezing  (this is your rescue inhaler)   CT Chest to follow up lung scarring and mediastinal adenopathy.   Continue on Requip.  Check labs today -iron panel .    Follow-up in 3 months with Dr. Wynona Neat or Zykeria Laguardia NP  Please contact office for sooner follow up if symptoms do not improve or worsen or seek emergency care     OSA (obstructive sleep apnea) Severe OSA - unable to tolerate CPAP . Set up for Titration study , if unable to tolerate CPAP after this . Consider referral to ENT for INSPIRE evaluation   Plan  Patient Instructions  Set up for CPAP titration study .  Change CPAP pressure to Auto CPAP 10 to 18cm.  Continue on CPAP at bedtime, wear all night long, need to get in 6 hrs or more. Use with naps.  Try Dream wear nasal mask.  Work on healthy weight loss Do not drive if sleepy Saline nasal spray Twice daily   Saline nasal gel At bedtime    Continue Zyrtec 10mg  daily As needed  drainage .  Continue on Symbicort 160 2 puffs Twice daily  , rinse after use.  Albuterol inhaler  As needed  for shortness of breath /wheezing  (this is your rescue inhaler)   CT Chest to follow up lung scarring and mediastinal adenopathy.   Continue on Requip.  Check labs today -iron panel .    Follow-up in 3 months with Dr. Wynona Neat or Harlow Basley NP  Please contact office for sooner follow up if symptoms do not improve or worsen or seek emergency care  RLS (restless legs syndrome) Cont on Requip . Check iron panel .  Control OSA better, CPAP titration study pending   Plan  Patient Instructions  Set up for CPAP titration study .  Change CPAP pressure to Auto CPAP 10 to 18cm.  Continue on CPAP at bedtime, wear all night long, need to get in 6 hrs or more. Use with naps.  Try Dream wear nasal mask.  Work  on healthy weight loss Do not drive if sleepy Saline nasal spray Twice daily   Saline nasal gel At bedtime    Continue Zyrtec 10mg  daily As needed  drainage .  Continue on Symbicort 160 2 puffs Twice daily  , rinse after use.  Albuterol inhaler  As needed  for shortness of breath /wheezing  (this is your rescue inhaler)   CT Chest to follow up lung scarring and mediastinal adenopathy.   Continue on Requip.  Check labs today -iron panel .    Follow-up in 3 months with Dr. Wynona Neat or Esias Mory NP  Please contact office for sooner follow up if symptoms do not improve or worsen or seek emergency care     Abnormal CT of the chest Bibasilar scarring, minor bronchiectasis and mediastinal lymph node noted on HRCT chest 2023. Check Serial CT chest for stability.      Rubye Oaks, NP 04/10/2023

## 2023-04-11 LAB — IRON,TIBC AND FERRITIN PANEL
%SAT: 34 % (calc) (ref 20–48)
Ferritin: 25 ng/mL — ABNORMAL LOW (ref 38–380)
Iron: 127 ug/dL (ref 50–180)
TIBC: 378 mcg/dL (calc) (ref 250–425)

## 2023-04-12 NOTE — Progress Notes (Signed)
ATC x1.  LVM to return call. 

## 2023-04-15 ENCOUNTER — Ambulatory Visit
Admission: RE | Admit: 2023-04-15 | Discharge: 2023-04-15 | Disposition: A | Discharge status: Home / Self Care | Payer: Federal, State, Local not specified - PPO | Source: Ambulatory Visit | Attending: Adult Health | Admitting: Adult Health

## 2023-04-15 ENCOUNTER — Other Ambulatory Visit: Payer: Self-pay | Admitting: *Deleted

## 2023-04-15 ENCOUNTER — Encounter: Payer: Self-pay | Admitting: *Deleted

## 2023-04-15 DIAGNOSIS — R590 Localized enlarged lymph nodes: Secondary | ICD-10-CM

## 2023-04-15 DIAGNOSIS — G4730 Sleep apnea, unspecified: Secondary | ICD-10-CM | POA: Diagnosis not present

## 2023-04-15 DIAGNOSIS — J45909 Unspecified asthma, uncomplicated: Secondary | ICD-10-CM | POA: Diagnosis not present

## 2023-04-15 MED ORDER — FERROUS SULFATE 325 (65 FE) MG PO TABS: 325.0000 mg | ORAL_TABLET | Freq: Every day | ORAL | 5 refills | Status: DC

## 2023-04-15 NOTE — Progress Notes (Signed)
Left VM x2. Set my chart message with results/recommendations.

## 2023-04-16 DIAGNOSIS — G4733 Obstructive sleep apnea (adult) (pediatric): Secondary | ICD-10-CM | POA: Diagnosis not present

## 2023-04-29 ENCOUNTER — Other Ambulatory Visit: Payer: Self-pay | Admitting: Internal Medicine

## 2023-04-30 NOTE — Telephone Encounter (Signed)
Requested Prescriptions  Pending Prescriptions Disp Refills   hydrochlorothiazide (MICROZIDE) 12.5 MG capsule [Pharmacy Med Name: HYDROCHLOROTHIAZIDE 12.5 MG CP] 90 capsule 1    Sig: TAKE 1 CAPSULE BY MOUTH EVERY DAY     Cardiovascular: Diuretics - Thiazide Passed - 04/29/2023  8:18 PM      Passed - Cr in normal range and within 180 days    Creat  Date Value Ref Range Status  02/27/2023 1.02 0.70 - 1.30 mg/dL Final         Passed - K in normal range and within 180 days    Potassium  Date Value Ref Range Status  02/27/2023 4.0 3.5 - 5.3 mmol/L Final         Passed - Na in normal range and within 180 days    Sodium  Date Value Ref Range Status  02/27/2023 138 135 - 146 mmol/L Final         Passed - Last BP in normal range    BP Readings from Last 1 Encounters:  04/10/23 110/68         Passed - Valid encounter within last 6 months    Recent Outpatient Visits           2 months ago Encounter for general adult medical examination with abnormal findings   [place] [facility] [place], Salvadore Oxford, NP   5 months ago RLS (restless legs syndrome)   [place] [facility] [place], Salvadore Oxford, NP   6 months ago RSV (respiratory syncytial virus infection)   [place] [facility] [place], Salvadore Oxford, NP   9 months ago Anxiety and depression   [place] [facility] [place], Salvadore Oxford, NP   1 year ago Bronchiectasis with acute exacerbation [facility])   [place] [place], Salvadore Oxford, NP       Future Appointments             In 4 months Baity, Salvadore Oxford, NP [place] [facility], [facility]

## 2023-05-01 NOTE — Progress Notes (Signed)
ATC x1.  LVM x1.

## 2023-05-01 NOTE — Progress Notes (Signed)
Patient has seen mychart message and results.  Nothing further needed.

## 2023-05-04 DIAGNOSIS — G4733 Obstructive sleep apnea (adult) (pediatric): Secondary | ICD-10-CM | POA: Diagnosis not present

## 2023-05-17 NOTE — Progress Notes (Signed)
Patient has seen CT result.  Nothing further needed.

## 2023-05-29 ENCOUNTER — Telehealth: Payer: Self-pay | Admitting: Adult Health

## 2023-05-29 NOTE — Telephone Encounter (Signed)
Patient would like pressure settings decreased to 7 for CPAP machine. Patient phone number is [phone].

## 2023-06-03 DIAGNOSIS — G4733 Obstructive sleep apnea (adult) (pediatric): Secondary | ICD-10-CM | POA: Diagnosis not present

## 2023-06-05 NOTE — Telephone Encounter (Signed)
Called and spoke with patient.  Patient stated he has gotten use to current cpap pressure and doesn't want to change anything at this time.  Nothing further at this time.

## 2023-06-06 DIAGNOSIS — W108XXA Fall (on) (from) other stairs and steps, initial encounter: Secondary | ICD-10-CM | POA: Diagnosis not present

## 2023-06-06 DIAGNOSIS — S96912A Strain of unspecified muscle and tendon at ankle and foot level, left foot, initial encounter: Secondary | ICD-10-CM | POA: Diagnosis not present

## 2023-06-06 DIAGNOSIS — S99912A Unspecified injury of left ankle, initial encounter: Secondary | ICD-10-CM | POA: Diagnosis not present

## 2023-06-06 DIAGNOSIS — M7989 Other specified soft tissue disorders: Secondary | ICD-10-CM | POA: Diagnosis not present

## 2023-06-06 DIAGNOSIS — S93402A Sprain of unspecified ligament of left ankle, initial encounter: Secondary | ICD-10-CM | POA: Diagnosis not present

## 2023-06-19 DIAGNOSIS — S93422D Sprain of deltoid ligament of left ankle, subsequent encounter: Secondary | ICD-10-CM | POA: Diagnosis not present

## 2023-06-23 ENCOUNTER — Other Ambulatory Visit: Payer: Self-pay | Admitting: Internal Medicine

## 2023-06-25 ENCOUNTER — Other Ambulatory Visit: Payer: Self-pay | Admitting: Internal Medicine

## 2023-06-25 NOTE — Telephone Encounter (Signed)
Requested Prescriptions  Pending Prescriptions Disp Refills   citalopram (CELEXA) 40 MG tablet [Pharmacy Med Name: CITALOPRAM HBR 40 MG TABLET] 90 tablet 0    Sig: TAKE 1 TABLET BY MOUTH EVERY DAY     Psychiatry:  Antidepressants - SSRI Passed - 06/23/2023 10:01 AM      Passed - Completed PHQ-2 or PHQ-9 in the last 360 days      Passed - Valid encounter within last 6 months    Recent Outpatient Visits           3 months ago Encounter for general adult medical examination with abnormal findings   [place] [facility] [place], Salvadore Oxford, NP   7 months ago RLS (restless legs syndrome)   [place] [facility] [place], Salvadore Oxford, NP   8 months ago RSV (respiratory syncytial virus infection)   [place] [facility] [place], Salvadore Oxford, NP   11 months ago Anxiety and depression   [place] [facility] [place], Salvadore Oxford, NP   1 year ago Bronchiectasis with acute exacerbation [facility])   [place] [facility] [place], Salvadore Oxford, NP       Future Appointments             In 2 months Baity, Salvadore Oxford, NP [facility]             rOPINIRole (REQUIP) 2 MG tablet [Pharmacy Med Name: ROPINIROLE HCL 2 MG TABLET] 270 tablet 0    Sig: TAKE 1 TABLET BY MOUTH THREE TIMES A DAY     Neurology:  Parkinsonian Agents Passed - 06/23/2023 10:01 AM      Passed - Last BP in normal range    BP Readings from Last 1 Encounters:  04/10/23 110/68         Passed - Last Heart Rate in normal range    Pulse Readings from Last 1 Encounters:  04/10/23 91         Passed - Valid encounter within last 12 months    Recent Outpatient Visits           3 months ago Encounter for general adult medical examination with abnormal findings   [place] [facility] [place], Salvadore Oxford, NP   7 months ago RLS (restless legs syndrome)   [place] [facility] [place], Salvadore Oxford, NP   8  months ago RSV (respiratory syncytial virus infection)   [place] [facility] [place], Salvadore Oxford, NP   11 months ago Anxiety and depression   [place] [facility] [place], Salvadore Oxford, NP   1 year ago Bronchiectasis with acute exacerbation [facility])   [facility] [place], Salvadore Oxford, NP       Future Appointments             In 2 months Baity, Salvadore Oxford, NP  [facility], [facility]

## 2023-06-27 NOTE — Telephone Encounter (Signed)
Requested Prescriptions  Pending Prescriptions Disp Refills   losartan (COZAAR) 100 MG tablet [Pharmacy Med Name: LOSARTAN POTASSIUM 100 MG TAB] 45 tablet 0    Sig: TAKE 1/2 TABLET BY MOUTH DAILY     Cardiovascular:  Angiotensin Receptor Blockers Passed - 06/25/2023  2:46 PM      Passed - Cr in normal range and within 180 days    Creat  Date Value Ref Range Status  02/27/2023 1.02 0.70 - 1.30 mg/dL Final         Passed - K in normal range and within 180 days    Potassium  Date Value Ref Range Status  02/27/2023 4.0 3.5 - 5.3 mmol/L Final         Passed - Patient is not pregnant      Passed - Last BP in normal range    BP Readings from Last 1 Encounters:  04/10/23 110/68         Passed - Valid encounter within last 6 months    Recent Outpatient Visits           4 months ago Encounter for general adult medical examination with abnormal findings   [place] [facility] [place], Salvadore Oxford, NP   7 months ago RLS (restless legs syndrome)   [place] [facility] [place], Salvadore Oxford, NP   8 months ago RSV (respiratory syncytial virus infection)   [place] [facility] [place], Salvadore Oxford, NP   11 months ago Anxiety and depression   [place] [facility] [place], Salvadore Oxford, NP   1 year ago Bronchiectasis with acute exacerbation [facility])   [place] [facility] [place], Salvadore Oxford, NP       Future Appointments             In 2 months Baity, Salvadore Oxford, NP [place] [facility], [facility]

## 2023-07-11 ENCOUNTER — Ambulatory Visit: Payer: Federal, State, Local not specified - PPO | Admitting: Adult Health

## 2023-07-11 ENCOUNTER — Encounter: Payer: Self-pay | Admitting: Adult Health

## 2023-07-11 ENCOUNTER — Encounter: Payer: Self-pay | Admitting: *Deleted

## 2023-07-11 VITALS — BP 124/80 | HR 94 | Ht 69.0 in | Wt 265.4 lb

## 2023-07-11 DIAGNOSIS — G2581 Restless legs syndrome: Secondary | ICD-10-CM

## 2023-07-11 DIAGNOSIS — R9389 Abnormal findings on diagnostic imaging of other specified body structures: Secondary | ICD-10-CM | POA: Diagnosis not present

## 2023-07-11 DIAGNOSIS — J4530 Mild persistent asthma, uncomplicated: Secondary | ICD-10-CM | POA: Diagnosis not present

## 2023-07-11 DIAGNOSIS — G4733 Obstructive sleep apnea (adult) (pediatric): Secondary | ICD-10-CM | POA: Diagnosis not present

## 2023-07-11 NOTE — Assessment & Plan Note (Signed)
Repeat CT chest in June showed stable scarring.  And no lymphadenopathy.  Very small 3 mm lingular nodule.  Patient has a very minimal smoking history.  No further imaging is indicated at this time.

## 2023-07-11 NOTE — Assessment & Plan Note (Signed)
Encouraged on increased CPAP compliance.  Will adjust CPAP for comfort to auto CPAP 10 to 16 cm H2O.  Plan  Patient Instructions  Change CPAP pressure to Auto CPAP 10 to 91m.  Continue on CPAP at bedtime, wear all night long, need to get in 6 hrs or more. Use with naps.  Work on healthy weight loss Do not drive if sleepy Saline nasal spray Twice daily   Saline nasal gel At bedtime    Continue Zyrtec 10mg  daily As needed  drainage .  Continue on Symbicort 160 2 puffs Twice daily  , rinse after use.  Albuterol inhaler  As needed  for shortness of breath /wheezing  (this is your rescue inhaler)   Continue on Requip.  Check labs today -iron panel .    Follow-up in 4 months with Dr. Wynona Neat or Aundra Espin NP  Please contact office for sooner follow up if symptoms do not improve or worsen or seek emergency care

## 2023-07-11 NOTE — Assessment & Plan Note (Signed)
Mild persistent asthma.  Doing well.  Continue on current regimen.  Asthma action plan discussed  Plan  Patient Instructions  Change CPAP pressure to Auto CPAP 10 to 75m.  Continue on CPAP at bedtime, wear all night long, need to get in 6 hrs or more. Use with naps.  Work on healthy weight loss Do not drive if sleepy Saline nasal spray Twice daily   Saline nasal gel At bedtime    Continue Zyrtec 10mg  daily As needed  drainage .  Continue on Symbicort 160 2 puffs Twice daily  , rinse after use.  Albuterol inhaler  As needed  for shortness of breath /wheezing  (this is your rescue inhaler)   Continue on Requip.  Check labs today -iron panel .    Follow-up in 4 months with Jonathan [name] or Jonathan Sweet NP  Please contact office for sooner follow up if symptoms do not improve or worsen or seek emergency care

## 2023-07-11 NOTE — Patient Instructions (Addendum)
Change CPAP pressure to Auto CPAP 10 to 43m.  Continue on CPAP at bedtime, wear all night long, need to get in 6 hrs or more. Use with naps.  Work on healthy weight loss Do not drive if sleepy Saline nasal spray Twice daily   Saline nasal gel At bedtime    Continue Zyrtec 10mg  daily As needed  drainage .  Continue on Symbicort 160 2 puffs Twice daily  , rinse after use.  Albuterol inhaler  As needed  for shortness of breath /wheezing  (this is your rescue inhaler)   Continue on Requip.  Check labs today -iron panel .    Follow-up in 4 months with Dr. Wynona Neat or Jolina Symonds NP  Please contact office for sooner follow up if symptoms do not improve or worsen or seek emergency care

## 2023-07-11 NOTE — Progress Notes (Signed)
@Patient  ID: Jonathan [name], male    DOB: 1971-07-10, 52 y.o.   MRN: [id number]  Chief Complaint  Patient presents with   Follow-up    Referring provider: Lorre Munroe, NP  HPI: 52 year old male with minimal smoking history seen for consult January 26, 2022 found to have severe sleep apnea.  Evaluated for chronic cough felt to be consistent with asthma Medical history significant for restless leg syndrome managed by primary care. Works at the [employer]  TEST/EVENTS :  02/26/22 HST Sleep study shows severe sleep apnea with AHI at 62.8/hour and SPO2 low at 79%    Chest x-ray February 19, 2022 showed left basilar linear opacities likely subsegmental atelectasis   pulmonary function testing -  This showed significant airway reversibility.  FEV1 81%, ratio 71, FVC 88%.,  67% bronchodilator change.  DLCO was 104%.   03/2022 [facility]) High-resolution CT chest showed no evidence of interstitial lung disease.  There was fairly significant bibasilar atelectasis/scarring with a minor element of bronchiectasis.  Likely to reflect sequela of infection/aspiration.  ILD considered less likely .  Nonspecific 8 mm soft tissue lymph node in the anterior mediastinum. Rib fractures   07/11/2023 Follow up : OSA , Asthma , RLS  Patient presents for a 33-month follow-up.  Patient has severe sleep apnea is on nocturnal CPAP.  Patient had had some difficulty last visit tolerating CPAP.  Pressures were changed 10 to 18 cm H2O.  Patient says he is doing fairly well on this.  Does feel that pressure is somewhat high at times.  He is switch back to a full facemask.  Was unable to tolerate nasal mask.  Download shows some improved usage.  Current usage at 83%.  Daily average usage at 2 hours.  Patient is on auto CPAP 10 to 18 cm H2O.  AHI 5.2/hour.  Minimal leaks. CPAP titratio was Patient says he does feel better when he wears his CPAP each night.  Says sometimes he does fall asleep before he puts his mask  on. Patient has restless leg syndrome.  Is on Requip.  Labs last visit showed ferritin level was slightly low.  He was started on iron therapy.  Patient says his restless leg symptoms are doing okay.  Some nights are worse than the others. Patient has a history of chronic cough and recurrent bronchitis since January 2023.  Workup showed HRCT chest negative for ILD.  Some bibasilar scarring and bronchiectasis.  Follow-up CT chest June 3 showed mild scarring in the right middle lobe lingula and bases.  3 mm lingular nodule.  No lymphadenopathy. We reviewed his CT results in detail. Patient says he has been doing very well.  Cough has improved substantially.  No current wheezing or cough.  Patient says he does not have a use his Symbicort every day.  No increased albuterol use.   No Known Allergies  Immunization History  Administered Date(s) Administered   Moderna Sars-Covid-2 Vaccination 03/07/2020, 03/28/2020, 09/16/2020   Td 08/25/2004   Tdap 02/27/2016    Past Medical History:  Diagnosis Date   Anxiety    GERD (gastroesophageal reflux disease)    Hypertension     Tobacco History: Social History   Tobacco Use  Smoking Status Former   Current packs/day: 0.00   Types: Cigarettes   Quit date: 1980   Years since quitting: 44.6  Smokeless Tobacco Current   Types: Chew   Ready to quit: Not Answered Counseling given: Not Answered   Outpatient Medications  Prior to Visit  Medication Sig Dispense Refill   albuterol (VENTOLIN HFA) 108 (90 Base) MCG/ACT inhaler INHALE 2 PUFFS BY MOUTH EVERY 6 HOURS AS NEEDED 18 each 2   ALPRAZolam (XANAX) 0.25 MG tablet Take 1 tablet (0.25 mg total) by mouth daily as needed for anxiety. 10 tablet 0   aspirin EC 81 MG tablet Take 1 tablet (81 mg total) by mouth daily. Swallow whole. 30 tablet 12   citalopram (CELEXA) 40 MG tablet TAKE 1 TABLET BY MOUTH EVERY DAY 90 tablet 0   ferrous sulfate 325 (65 FE) MG tablet Take 1 tablet (325 mg total) by mouth  daily with breakfast. 30 tablet 5   hydrochlorothiazide (MICROZIDE) 12.5 MG capsule TAKE 1 CAPSULE BY MOUTH EVERY DAY 90 capsule 1   losartan (COZAAR) 100 MG tablet TAKE 1/2 TABLET BY MOUTH DAILY 45 tablet 0   Multiple Vitamin (MULTIVITAMIN) tablet Take 1 tablet by mouth daily.     omeprazole (PRILOSEC) 40 MG capsule TAKE 1 CAPSULE BY MOUTH EVERY DAY 90 capsule 3   rOPINIRole (REQUIP) 2 MG tablet TAKE 1 TABLET BY MOUTH THREE TIMES A DAY 270 tablet 0   budesonide-formoterol (SYMBICORT) 160-4.5 MCG/ACT inhaler Inhale into the lungs.     ipratropium (ATROVENT) 0.02 % nebulizer solution Inhale into the lungs.     No facility-administered medications prior to visit.     Review of Systems:   Constitutional:   No  weight loss, night sweats,  Fevers, chills, fatigue, or  lassitude.  HEENT:   No headaches,  Difficulty swallowing,  Tooth/dental problems, or  Sore throat,                No sneezing, itching, ear ache, nasal congestion, post nasal drip,   CV:  No chest pain,  Orthopnea, PND, swelling in lower extremities, anasarca, dizziness, palpitations, syncope.   GI  No heartburn, indigestion, abdominal pain, nausea, vomiting, diarrhea, change in bowel habits, loss of appetite, bloody stools.   Resp: No shortness of breath with exertion or at rest.  No excess mucus, no productive cough,  No non-productive cough,  No coughing up of blood.  No change in color of mucus.  No wheezing.  No chest wall deformity  Skin: no rash or lesions.  GU: no dysuria, change in color of urine, no urgency or frequency.  No flank pain, no hematuria   MS:  No joint pain or swelling.  No decreased range of motion.  No back pain.    Physical Exam  BP 124/80 (BP Location: Left Arm, Patient Position: Sitting, Cuff Size: Normal)   Pulse 94   Ht 5\' 9"  (1.753 m)   Wt 265 lb 6.4 oz (120.4 kg)   SpO2 98%   BMI 39.19 kg/m   GEN: A/Ox3; pleasant , NAD, well nourished    HEENT:  [place]/AT,  NOSE-clear, THROAT-clear,  no lesions, no postnasal drip or exudate noted.   NECK:  Supple w/ fair ROM; no JVD; normal carotid impulses w/o bruits; no thyromegaly or nodules palpated; no lymphadenopathy.    RESP  Clear  P & A; w/o, wheezes/ rales/ or rhonchi. no accessory muscle use, no dullness to percussion  CARD:  RRR, no m/r/g, no peripheral edema, pulses intact, no cyanosis or clubbing.  GI:   Soft & nt; nml bowel sounds; no organomegaly or masses detected.   Musco: Warm bil, no deformities or joint swelling noted.   Neuro: alert, no focal deficits noted.    Skin: Warm,  no lesions or rashes    Lab Results:  CBC    Component Value Date/Time   WBC 6.7 02/27/2023 1044   RBC 5.29 02/27/2023 1044   HGB 15.2 02/27/2023 1044   HCT 45.0 02/27/2023 1044   PLT 194 02/27/2023 1044   MCV 85.1 02/27/2023 1044   MCH 28.7 02/27/2023 1044   MCHC 33.8 02/27/2023 1044   RDW 12.7 02/27/2023 1044   LYMPHSABS 1.9 02/27/2016 1032   MONOABS 0.6 02/27/2016 1032   EOSABS 0.1 02/27/2016 1032   BASOSABS 0.0 02/27/2016 1032    BMET    Component Value Date/Time   NA 138 02/27/2023 1044   K 4.0 02/27/2023 1044   CL 103 02/27/2023 1044   CO2 26 02/27/2023 1044   GLUCOSE 97 02/27/2023 1044   BUN 9 02/27/2023 1044   CREATININE 1.02 02/27/2023 1044   CALCIUM 9.0 02/27/2023 1044   GFRNONAA 95.90 09/13/2010 1103    BNP No results found for: "BNP"  ProBNP No results found for: "PROBNP"  Imaging: No results found.  Administration History     None          Latest Ref Rng & Units 05/22/2022    9:50 AM  PFT Results  FVC-Pre L 3.24   FVC-Predicted Pre % 66   FVC-Post L 4.31   FVC-Predicted Post % 88   Pre FEV1/FVC % % 56   Post FEV1/FCV % % 71   FEV1-Pre L 1.83   FEV1-Predicted Pre % 48   FEV1-Post L 3.07   DLCO uncorrected ml/min/mmHg 29.71   DLCO UNC% % 104   DLCO corrected ml/min/mmHg 29.71   DLCO COR %Predicted % 104   DLVA Predicted % 100   TLC L 7.87   TLC % Predicted % 116   RV %  Predicted % 164     No results found for: "NITRICOXIDE"      Assessment & Plan:   Asthma Mild persistent asthma.  Doing well.  Continue on current regimen.  Asthma action plan discussed  Plan  Patient Instructions  Change CPAP pressure to Auto CPAP 10 to 2m.  Continue on CPAP at bedtime, wear all night long, need to get in 6 hrs or more. Use with naps.  Work on healthy weight loss Do not drive if sleepy Saline nasal spray Twice daily   Saline nasal gel At bedtime    Continue Zyrtec 10mg  daily As needed  drainage .  Continue on Symbicort 160 2 puffs Twice daily  , rinse after use.  Albuterol inhaler  As needed  for shortness of breath /wheezing  (this is your rescue inhaler)   Continue on Requip.  Check labs today -iron panel .    Follow-up in 4 months with Dr. Wynona Neat or Yexalen Deike NP  Please contact office for sooner follow up if symptoms do not improve or worsen or seek emergency care     OSA (obstructive sleep apnea) Encouraged on increased CPAP compliance.  Will adjust CPAP for comfort to auto CPAP 10 to 16 cm H2O.  Plan  Patient Instructions  Change CPAP pressure to Auto CPAP 10 to 61m.  Continue on CPAP at bedtime, wear all night long, need to get in 6 hrs or more. Use with naps.  Work on healthy weight loss Do not drive if sleepy Saline nasal spray Twice daily   Saline nasal gel At bedtime    Continue Zyrtec 10mg  daily As needed  drainage .  Continue on Symbicort 160 2  puffs Twice daily  , rinse after use.  Albuterol inhaler  As needed  for shortness of breath /wheezing  (this is your rescue inhaler)   Continue on Requip.  Check labs today -iron panel .    Follow-up in 4 months with Dr. Wynona Neat or Darrion Wyszynski NP  Please contact office for sooner follow up if symptoms do not improve or worsen or seek emergency care     RLS (restless legs syndrome) Continue on current regimen and follow-up with primary care.  Recent lab work did show decreased ferritin level.   Continue on iron supplement.  Advised to follow-up with primary care for ongoing management also encouraged on CPAP compliance and management of his underlying sleep apnea  Abnormal CT of the chest Repeat CT chest in June showed stable scarring.  And no lymphadenopathy.  Very small 3 mm lingular nodule.  Patient has a very minimal smoking history.  No further imaging is indicated at this time.     Rubye Oaks, NP 07/11/2023

## 2023-07-11 NOTE — Assessment & Plan Note (Signed)
Continue on current regimen and follow-up with primary care.  Recent lab work did show decreased ferritin level.  Continue on iron supplement.  Advised to follow-up with primary care for ongoing management also encouraged on CPAP compliance and management of his underlying sleep apnea

## 2023-07-23 DIAGNOSIS — G4733 Obstructive sleep apnea (adult) (pediatric): Secondary | ICD-10-CM | POA: Diagnosis not present

## 2023-08-22 DIAGNOSIS — G4733 Obstructive sleep apnea (adult) (pediatric): Secondary | ICD-10-CM | POA: Diagnosis not present

## 2023-08-29 ENCOUNTER — Encounter: Payer: Self-pay | Admitting: Internal Medicine

## 2023-08-29 ENCOUNTER — Ambulatory Visit: Payer: Federal, State, Local not specified - PPO | Admitting: Internal Medicine

## 2023-08-29 VITALS — BP 112/78 | HR 86 | Ht 69.0 in | Wt 261.0 lb

## 2023-08-29 DIAGNOSIS — G2581 Restless legs syndrome: Secondary | ICD-10-CM

## 2023-08-29 DIAGNOSIS — Z6838 Body mass index (BMI) 38.0-38.9, adult: Secondary | ICD-10-CM

## 2023-08-29 DIAGNOSIS — E782 Mixed hyperlipidemia: Secondary | ICD-10-CM | POA: Diagnosis not present

## 2023-08-29 DIAGNOSIS — G4733 Obstructive sleep apnea (adult) (pediatric): Secondary | ICD-10-CM

## 2023-08-29 DIAGNOSIS — E66812 Obesity, class 2: Secondary | ICD-10-CM

## 2023-08-29 DIAGNOSIS — I10 Essential (primary) hypertension: Secondary | ICD-10-CM

## 2023-08-29 DIAGNOSIS — I700 Atherosclerosis of aorta: Secondary | ICD-10-CM

## 2023-08-29 DIAGNOSIS — J4530 Mild persistent asthma, uncomplicated: Secondary | ICD-10-CM

## 2023-08-29 DIAGNOSIS — K219 Gastro-esophageal reflux disease without esophagitis: Secondary | ICD-10-CM

## 2023-08-29 DIAGNOSIS — F419 Anxiety disorder, unspecified: Secondary | ICD-10-CM

## 2023-08-29 DIAGNOSIS — R7303 Prediabetes: Secondary | ICD-10-CM | POA: Diagnosis not present

## 2023-08-29 DIAGNOSIS — F32A Depression, unspecified: Secondary | ICD-10-CM

## 2023-08-29 NOTE — Assessment & Plan Note (Signed)
Not currently taking Symbicort and albuterol We will monitor

## 2023-08-29 NOTE — Assessment & Plan Note (Signed)
C-Met and lipid profile today We will do statin therapy if LDL >70 Continue baby aspirin Encouraged low-fat diet

## 2023-08-29 NOTE — Assessment & Plan Note (Signed)
Stable on citalopram and Xanax Support offered

## 2023-08-29 NOTE — Patient Instructions (Signed)

## 2023-08-29 NOTE — Assessment & Plan Note (Signed)
C-Met and lipid profile today Encouraged him to consume a low-fat diet

## 2023-08-29 NOTE — Assessment & Plan Note (Signed)
Encouraged diet and exercise for weight loss ?

## 2023-08-29 NOTE — Progress Notes (Signed)
Subjective:    Patient ID: Jonathan [name], male    DOB: [birth year], 52 y.o.   MRN: [id number]  HPI  Patient presents to clinic today for 52-month follow-up of chronic conditions.  OSA: He averages 5 hours of sleep per night with use of CPAP.  Sleep study from 02/2022 reviewed.  Anxiety and depression: Chronic, managed on citalopram.  He takes xanax only as needed for flying.  He is not currently seeing a therapist.  He denies SI/HI.  HTN: His BP today is 112/78.  He is taking losartan and HCTZ as prescribed.  ECG from 05/2012 reviewed.  GERD: He is not sure what triggers this. He denies breakthrough on omeprazole.  There is no upper GI on file.  RLS: Managed on ropinirole.  He does not follow with neurology.  Prediabetes: His last A1c was 6%, 02/2023.  He does not check his sugars.  He is not taking any oral diabetic medication at this time.  HLD with aortic atherosclerosis: His last LDL was 90, triglycerides 409, 02/2023.  He is not taking any cholesterol-lowering medication at this time.  He is taking aspirin.  He does not consume a low-fat diet.  Allergy induced asthma: He is not taking symbicort and albuterol as prescribed.  He is not taking any antihistamines.  PFTs from 05/2022 reviewed.  He does not follow-up with the asthma or allergy clinic.  Review of Systems     Past Medical History:  Diagnosis Date   Anxiety    GERD (gastroesophageal reflux disease)    Hypertension     Current Outpatient Medications  Medication Sig Dispense Refill   albuterol (VENTOLIN HFA) 108 (90 Base) MCG/ACT inhaler INHALE 2 PUFFS BY MOUTH EVERY 6 HOURS AS NEEDED 18 each 2   ALPRAZolam (XANAX) 0.25 MG tablet Take 1 tablet (0.25 mg total) by mouth daily as needed for anxiety. 10 tablet 0   aspirin EC 81 MG tablet Take 1 tablet (81 mg total) by mouth daily. Swallow whole. 30 tablet 12   budesonide-formoterol (SYMBICORT) 160-4.5 MCG/ACT inhaler Inhale into the lungs.     citalopram (CELEXA) 40  MG tablet TAKE 1 TABLET BY MOUTH EVERY DAY 90 tablet 0   ferrous sulfate 325 (65 FE) MG tablet Take 1 tablet (325 mg total) by mouth daily with breakfast. 30 tablet 5   hydrochlorothiazide (MICROZIDE) 12.5 MG capsule TAKE 1 CAPSULE BY MOUTH EVERY DAY 90 capsule 1   ipratropium (ATROVENT) 0.02 % nebulizer solution Inhale into the lungs.     losartan (COZAAR) 100 MG tablet TAKE 1/2 TABLET BY MOUTH DAILY 45 tablet 0   Multiple Vitamin (MULTIVITAMIN) tablet Take 1 tablet by mouth daily.     omeprazole (PRILOSEC) 40 MG capsule TAKE 1 CAPSULE BY MOUTH EVERY DAY 90 capsule 3   rOPINIRole (REQUIP) 2 MG tablet TAKE 1 TABLET BY MOUTH THREE TIMES A DAY 270 tablet 0   No current facility-administered medications for this visit.    No Known Allergies  Family History  Problem Relation Age of Onset   Breast cancer Mother    Hypertension Mother    Prostate cancer Father     Social History   Socioeconomic History   Marital status: Married    Spouse name: Not on file   Number of children: 0   Years of education: Not on file   Highest education level: Not on file  Occupational History   Not on file  Tobacco Use   Smoking status: Former  Current packs/day: 0.00    Types: Cigarettes    Quit date: 1980    Years since quitting: 44.8   Smokeless tobacco: Current    Types: Chew  Vaping Use   Vaping status: Never Used  Substance and Sexual Activity   Alcohol use: No    Alcohol/week: 0.0 standard drinks of alcohol   Drug use: Never   Sexual activity: Not on file  Other Topics Concern   Not on file  Social History Narrative   Not on file   Social Determinants of Health   Financial Resource Strain: Low Risk  (04/08/2022)   Received from [facility]   Overall Financial Resource Strain (CARDIA)    Difficulty of Paying Living Expenses: Not hard at all  Food Insecurity: No Food Insecurity (04/08/2022)   Received from [facility]   Hunger Vital Sign    Worried About Running Out of  Food in the Last Year: Never true    Ran Out of Food in the Last Year: Never true  Transportation Needs: No Transportation Needs (04/08/2022)   Received from [facility]   PRAPARE - Transportation    Lack of Transportation (Medical): No    Lack of Transportation (Non-Medical): No  Physical Activity: Not on file  Stress: Not on file  Social Connections: Not on file  Intimate Partner Violence: Not on file     Constitutional: Denies fever, malaise, fatigue, headache or abrupt weight changes.  HEENT: Denies eye pain, eye redness, ear pain, ringing in the ears, wax buildup, runny nose, nasal congestion, bloody nose, or sore throat. Respiratory: Denies difficulty breathing, shortness of breath, cough or sputum production.   Cardiovascular: Denies chest pain, chest tightness, palpitations or swelling in the hands or feet.  Gastrointestinal: Denies abdominal pain, bloating, constipation, diarrhea or blood in the stool.  GU: Denies urgency, frequency, pain with urination, burning sensation, blood in urine, odor or discharge. Musculoskeletal: Denies decrease in range of motion, difficulty with gait, muscle pain or joint pain and swelling.  Skin: Denies redness, rashes, lesions or ulcercations.  Neurological: Patient reports restless legs.  Denies dizziness, difficulty with memory, difficulty with speech or problems with balance and coordination.  Psych: Patient has a history of anxiety and depression.  Denies SI/HI.  No other specific complaints in a complete review of systems (except as listed in HPI above).  Objective:   Physical Exam   BP 112/78   Pulse 86   Ht 5\' 9"  (1.753 m)   Wt 261 lb (118.4 kg)   SpO2 98%   BMI 38.54 kg/m   Wt Readings from Last 3 Encounters:  07/11/23 265 lb 6.4 oz (120.4 kg)  04/10/23 255 lb 12.8 oz (116 kg)  02/27/23 255 lb 9.6 oz (115.9 kg)    General: Appears his stated age, obese, in NAD. Skin: Warm, dry and intact.  HEENT: Head: normal shape and  size; Eyes: sclera white, no icterus, conjunctiva pink, PERRLA and EOMs intact;  Cardiovascular: Normal rate and rhythm. S1,S2 noted.  No murmur, rubs or gallops noted. No JVD or BLE edema. No carotid bruits noted. Pulmonary/Chest: Normal effort and positive vesicular breath sounds. No respiratory distress. No wheezes, rales or ronchi noted.  Abdomen: Soft and nontender. Normal bowel sounds.  Musculoskeletal:  No difficulty with gait.  Neurological: Alert and oriented. Coordination normal.  Psychiatric: Mood and affect normal. Behavior is normal. Judgment and thought content normal.   BMET    Component Value Date/Time   NA  138 02/27/2023 1044   K 4.0 02/27/2023 1044   CL 103 02/27/2023 1044   CO2 26 02/27/2023 1044   GLUCOSE 97 02/27/2023 1044   BUN 9 02/27/2023 1044   CREATININE 1.02 02/27/2023 1044   CALCIUM 9.0 02/27/2023 1044   GFRNONAA 95.90 09/13/2010 1103    Lipid Panel     Component Value Date/Time   CHOL 152 02/27/2023 1044   TRIG 142 02/27/2023 1044   HDL 38 (L) 02/27/2023 1044   CHOLHDL 4.0 02/27/2023 1044   VLDL 51.4 (H) 01/31/2021 1035   LDLCALC 90 02/27/2023 1044    CBC    Component Value Date/Time   WBC 6.7 02/27/2023 1044   RBC 5.29 02/27/2023 1044   HGB 15.2 02/27/2023 1044   HCT 45.0 02/27/2023 1044   PLT 194 02/27/2023 1044   MCV 85.1 02/27/2023 1044   MCH 28.7 02/27/2023 1044   MCHC 33.8 02/27/2023 1044   RDW 12.7 02/27/2023 1044   LYMPHSABS 1.9 02/27/2016 1032   MONOABS 0.6 02/27/2016 1032   EOSABS 0.1 02/27/2016 1032   BASOSABS 0.0 02/27/2016 1032    Hgb A1C Lab Results  Component Value Date   HGBA1C 6.0 (H) 02/27/2023           Assessment & Plan:      RTC in 6 months for your annual exam Nicki Reaper, NP

## 2023-08-29 NOTE — Assessment & Plan Note (Signed)
A1c today Encourage low-carb diet and exercise for weight loss

## 2023-08-29 NOTE — Assessment & Plan Note (Signed)
Controlled on losartan and hydrochlorothiazide Reinforced DASH diet and exercise for weight loss C-Met today

## 2023-08-29 NOTE — Assessment & Plan Note (Signed)
Try to identify and avoid foods that trigger reflux Continue omeprazole Encourage weight loss as discomfort is reflux symptoms

## 2023-08-29 NOTE — Assessment & Plan Note (Signed)
Continue ropinirole

## 2023-08-29 NOTE — Assessment & Plan Note (Signed)
Encourage weight loss as this can help reduce sleep apnea symptoms Continue CPAP use 

## 2023-08-30 LAB — CBC
HCT: 48.2 % (ref 38.5–50.0)
Hemoglobin: 15.8 g/dL (ref 13.2–17.1)
MCH: 28.3 pg (ref 27.0–33.0)
MCHC: 32.8 g/dL (ref 32.0–36.0)
MCV: 86.4 fL (ref 80.0–100.0)
MPV: 10.2 fL (ref 7.5–12.5)
Platelets: 195 10*3/uL (ref 140–400)
RBC: 5.58 10*6/uL (ref 4.20–5.80)
RDW: 13 % (ref 11.0–15.0)
WBC: 6.4 10*3/uL (ref 3.8–10.8)

## 2023-08-30 LAB — COMPLETE METABOLIC PANEL WITH GFR
AG Ratio: 1.8 (calc) (ref 1.0–2.5)
ALT: 35 U/L (ref 9–46)
AST: 23 U/L (ref 10–35)
Albumin: 4.4 g/dL (ref 3.6–5.1)
Alkaline phosphatase (APISO): 87 U/L (ref 35–144)
BUN: 14 mg/dL (ref 7–25)
CO2: 28 mmol/L (ref 20–32)
Calcium: 9.2 mg/dL (ref 8.6–10.3)
Chloride: 103 mmol/L (ref 98–110)
Creat: 0.99 mg/dL (ref 0.70–1.30)
Globulin: 2.5 g/dL (ref 1.9–3.7)
Glucose, Bld: 92 mg/dL (ref 65–99)
Potassium: 4.2 mmol/L (ref 3.5–5.3)
Sodium: 139 mmol/L (ref 135–146)
Total Bilirubin: 0.8 mg/dL (ref 0.2–1.2)
Total Protein: 6.9 g/dL (ref 6.1–8.1)
eGFR: 92 mL/min/{1.73_m2} (ref >=60)

## 2023-08-30 LAB — LIPID PANEL
Cholesterol: 160 mg/dL (ref <200)
HDL: 37 mg/dL — ABNORMAL LOW (ref >=40)
LDL Cholesterol (Calc): 103 mg/dL — ABNORMAL HIGH
Non-HDL Cholesterol (Calc): 123 mg/dL (ref <130)
Total CHOL/HDL Ratio: 4.3 (calc) (ref <5.0)
Triglycerides: 100 mg/dL (ref <150)

## 2023-08-30 LAB — HEMOGLOBIN A1C
Hgb A1c MFr Bld: 6 %{Hb} — ABNORMAL HIGH (ref <5.7)
Mean Plasma Glucose: 126 mg/dL
eAG (mmol/L): 7 mmol/L

## 2023-09-13 ENCOUNTER — Other Ambulatory Visit: Payer: Self-pay | Admitting: Internal Medicine

## 2023-09-13 NOTE — Telephone Encounter (Signed)
Requested Prescriptions  Pending Prescriptions Disp Refills   rOPINIRole (REQUIP) 2 MG tablet [Pharmacy Med Name: ROPINIROLE HCL 2 MG TABLET] 270 tablet 0    Sig: TAKE 1 TABLET BY MOUTH THREE TIMES A DAY     Neurology:  Parkinsonian Agents Passed - 09/13/2023  6:47 AM      Passed - Last BP in normal range    BP Readings from Last 1 Encounters:  08/29/23 112/78         Passed - Last Heart Rate in normal range    Pulse Readings from Last 1 Encounters:  08/29/23 86         Passed - Valid encounter within last 12 months    Recent Outpatient Visits           2 weeks ago Prediabetes   [place] [facility] [place], Salvadore Oxford, NP   6 months ago Encounter for general adult medical examination with abnormal findings   [place] [facility] [place], Salvadore Oxford, NP   10 months ago RLS (restless legs syndrome)   [place] [facility] [place], Salvadore Oxford, NP   11 months ago RSV (respiratory syncytial virus infection)   [place] [facility] [place], Salvadore Oxford, NP   1 year ago Anxiety and depression   [place] [facility] [place], Salvadore Oxford, NP       Future Appointments             In 2 months Parrett, Virgel Bouquet, NP [place] [facility] [place]   In 5 months Baity, Salvadore Oxford, NP [place] [facility]             citalopram (CELEXA) 40 MG tablet [Pharmacy Med Name: CITALOPRAM HBR 40 MG TABLET] 90 tablet 0    Sig: TAKE 1 TABLET BY MOUTH EVERY DAY     Psychiatry:  Antidepressants - SSRI Passed - 09/13/2023  6:47 AM      Passed - Completed PHQ-2 or PHQ-9 in the last 360 days      Passed - Valid encounter within last 6 months    Recent Outpatient Visits           2 weeks ago Prediabetes   [place] [facility] [place], Salvadore Oxford, NP   6 months ago Encounter for general adult medical examination with abnormal findings   [place] [facility] [place], Salvadore Oxford, NP   10 months ago RLS (restless legs syndrome)   [place] [facility] [place], Salvadore Oxford, NP   11 months ago RSV (respiratory syncytial virus infection)   [place] [facility] [place], Salvadore Oxford, NP   1 year ago Anxiety and depression   [place] [facility] [place], Salvadore Oxford, NP       Future Appointments             In 2 months Parrett, Virgel Bouquet, NP [place] [facility] [place]   In 5 months Baity, Salvadore Oxford, NP [place] [facility]

## 2023-09-22 DIAGNOSIS — G4733 Obstructive sleep apnea (adult) (pediatric): Secondary | ICD-10-CM | POA: Diagnosis not present

## 2023-10-26 ENCOUNTER — Other Ambulatory Visit: Payer: Self-pay | Admitting: Adult Health

## 2023-10-30 ENCOUNTER — Other Ambulatory Visit: Payer: Self-pay | Admitting: Internal Medicine

## 2023-10-30 NOTE — Telephone Encounter (Signed)
Requested Prescriptions  Pending Prescriptions Disp Refills   losartan (COZAAR) 100 MG tablet [Pharmacy Med Name: LOSARTAN POTASSIUM 100 MG TAB] 45 tablet 0    Sig: TAKE 1/2 TABLET BY MOUTH DAILY     Cardiovascular:  Angiotensin Receptor Blockers Passed - 10/30/2023  2:35 PM      Passed - Cr in normal range and within 180 days    Creat  Date Value Ref Range Status  08/29/2023 0.99 0.70 - 1.30 mg/dL Final         Passed - K in normal range and within 180 days    Potassium  Date Value Ref Range Status  08/29/2023 4.2 3.5 - 5.3 mmol/L Final         Passed - Patient is not pregnant      Passed - Last BP in normal range    BP Readings from Last 1 Encounters:  08/29/23 112/78         Passed - Valid encounter within last 6 months    Recent Outpatient Visits           2 months ago Prediabetes   [facility] [place], Salvadore Oxford, NP   8 months ago Encounter for general adult medical examination with abnormal findings   [place] [facility] [place], Salvadore Oxford, NP   11 months ago RLS (restless legs syndrome)   [place], Salvadore Oxford, NP   1 year ago RSV (respiratory syncytial virus infection)   [place] [facility] [place], [place], NP   1 year ago Anxiety and depression   [facility] [place], Salvadore Oxford, NP       Future Appointments             In 3 weeks Parrett, Virgel Bouquet, NP [place] [facility] [place]   In 4 months Baity, Salvadore Oxford, NP [place] [facility]             hydrochlorothiazide (MICROZIDE) 12.5 MG capsule [Pharmacy Med Name: HYDROCHLOROTHIAZIDE 12.5 MG CP] 90 capsule 1    Sig: TAKE 1 CAPSULE BY MOUTH EVERY DAY     Cardiovascular: Diuretics - Thiazide Passed - 10/30/2023  2:35 PM      Passed - Cr in normal range and within 180 days    Creat  Date Value Ref Range Status  08/29/2023 0.99 0.70 - 1.30  mg/dL Final         Passed - K in normal range and within 180 days    Potassium  Date Value Ref Range Status  08/29/2023 4.2 3.5 - 5.3 mmol/L Final         Passed - Na in normal range and within 180 days    Sodium  Date Value Ref Range Status  08/29/2023 139 135 - 146 mmol/L Final         Passed - Last BP in normal range    BP Readings from Last 1 Encounters:  08/29/23 112/78         Passed - Valid encounter within last 6 months    Recent Outpatient Visits           2 months ago Prediabetes   [place] [place], Salvadore Oxford, NP   8 months ago Encounter for general adult medical examination with abnormal findings    [facility] [place],  Salvadore Oxford, NP   11 months ago RLS (restless legs syndrome)   [facility] [place], Salvadore Oxford, NP   1 year ago RSV (respiratory syncytial virus infection)   [place] [facility] [place], Salvadore Oxford, NP   1 year ago Anxiety and depression   [place] [facility] [place], Salvadore Oxford, NP       Future Appointments             In 3 weeks Parrett, Virgel Bouquet, NP  [place] [facility] [place]   In 4 months Baity, Salvadore Oxford, NP [facility], [facility]

## 2023-11-01 NOTE — Telephone Encounter (Signed)
Pt is scheduled for follow up in January. Can discuss further refills then.

## 2023-11-20 ENCOUNTER — Ambulatory Visit: Payer: Federal, State, Local not specified - PPO | Admitting: Adult Health

## 2023-12-11 ENCOUNTER — Other Ambulatory Visit: Payer: Self-pay | Admitting: Internal Medicine

## 2023-12-12 ENCOUNTER — Other Ambulatory Visit: Payer: Self-pay | Admitting: Internal Medicine

## 2023-12-12 NOTE — Telephone Encounter (Signed)
Requested medications are due for refill today.  Unsure  Requested medications are on the active medications list.  yes  Last refill. 04/09/2022  Future visit scheduled.   yes  Notes to clinic.  Historical medication.    Requested Prescriptions  Pending Prescriptions Disp Refills   budesonide-formoterol (SYMBICORT) 160-4.5 MCG/ACT inhaler [Pharmacy Med Name: BUDESONIDE-FORMOTEROL 160-4.5] 10.2 each 12    Sig: INHALE 2 PUFFS TWO TIMES A DAY.     Pulmonology:  Combination Products Passed - 12/12/2023  3:00 PM      Passed - Valid encounter within last 12 months    Recent Outpatient Visits           3 months ago Prediabetes   [place] [facility] [place], Salvadore Oxford, NP   9 months ago Encounter for general adult medical examination with abnormal findings   [place] [place], Salvadore Oxford, NP   1 year ago RLS (restless legs syndrome)   [place] [facility] [place], Salvadore Oxford, NP   1 year ago RSV (respiratory syncytial virus infection)   [place] [place], Salvadore Oxford, NP   1 year ago Anxiety and depression   [place] [facility] [place], Salvadore Oxford, NP       Future Appointments             In 4 weeks Parrett, Virgel Bouquet, NP [place] [facility] [place]   In 2 months Baity, Salvadore Oxford, NP [place] [facility], [facility]            Signed Prescriptions Disp Refills   citalopram (CELEXA) 40 MG tablet 90 tablet 0    Sig: TAKE 1 TABLET BY MOUTH EVERY DAY     Psychiatry:  Antidepressants - SSRI Passed - 12/12/2023  3:00 PM      Passed - Completed PHQ-2 or PHQ-9 in the last 360 days      Passed - Valid encounter within last 6 months    Recent Outpatient Visits           3 months ago Prediabetes   [place] [facility] [place], Salvadore Oxford, NP   9 months ago Encounter for general adult medical examination with abnormal findings   [facility] [place], Salvadore Oxford, NP   1 year ago RLS (restless legs syndrome)   [place] [place], Salvadore Oxford, NP   1 year ago RSV (respiratory syncytial virus infection)   [facility] [place], Salvadore Oxford, NP   1 year ago Anxiety and depression   [place] [place], Salvadore Oxford, NP       Future Appointments             In 4 weeks Parrett, Virgel Bouquet, NP [place] [facility] [place]   In 2 months Baity, Salvadore Oxford, NP  [facility]             omeprazole (PRILOSEC) 40 MG capsule 90 capsule 0    Sig: TAKE 1 CAPSULE BY MOUTH EVERY DAY     Gastroenterology: Proton Pump Inhibitors Passed - 12/12/2023  3:00 PM      Passed - Valid encounter within last 12 months    Recent Outpatient Visits           3 months  ago Prediabetes   [place] [facility] [place], Salvadore Oxford, NP   9 months ago Encounter for general adult medical examination with abnormal findings   [place] [facility] [place], Salvadore Oxford, NP   1 year ago RLS (restless legs syndrome)   [place] [facility] [place], Salvadore Oxford, NP   1 year ago RSV (respiratory syncytial virus infection)   [place] [facility] [place], Salvadore Oxford, NP   1 year ago Anxiety and depression   [place] [facility] [place], Salvadore Oxford, NP       Future Appointments             In 4 weeks Parrett, Virgel Bouquet, NP [place] [facility] [place]   In 2 months Baity, Salvadore Oxford, NP [facility]

## 2023-12-12 NOTE — Telephone Encounter (Signed)
Requested Prescriptions  Pending Prescriptions Disp Refills   citalopram (CELEXA) 40 MG tablet [Pharmacy Med Name: CITALOPRAM HBR 40 MG TABLET] 90 tablet 0    Sig: TAKE 1 TABLET BY MOUTH EVERY DAY     Psychiatry:  Antidepressants - SSRI Passed - 12/12/2023  2:59 PM      Passed - Completed PHQ-2 or PHQ-9 in the last 360 days      Passed - Valid encounter within last 6 months    Recent Outpatient Visits           3 months ago Prediabetes   [facility] [place], Salvadore Oxford, NP   9 months ago Encounter for general adult medical examination with abnormal findings   [facility] [place], Salvadore Oxford, NP   1 year ago RLS (restless legs syndrome)   [place] [facility] [place], Salvadore Oxford, NP   1 year ago RSV (respiratory syncytial virus infection)   [facility] [place], Salvadore Oxford, NP   1 year ago Anxiety and depression   [place] [facility], Salvadore Oxford, NP       Future Appointments             In 4 weeks Parrett, Virgel Bouquet, NP [place] [facility] [place]   In 2 months Baity, Salvadore Oxford, NP [place] [facility]             omeprazole (PRILOSEC) 40 MG capsule [Pharmacy Med Name: OMEPRAZOLE DR 40 MG CAPSULE] 90 capsule 3    Sig: TAKE 1 CAPSULE BY MOUTH EVERY DAY     Gastroenterology: Proton Pump Inhibitors Passed - 12/12/2023  2:59 PM      Passed - Valid encounter within last 12 months    Recent Outpatient Visits           3 months ago Prediabetes   [place] [facility] [place], Salvadore Oxford, NP   9 months ago Encounter for general adult medical examination with abnormal findings   [place] [facility] [place], Salvadore Oxford, NP   1 year ago RLS (restless legs syndrome)   [place] [place], Salvadore Oxford, NP   1 year ago RSV (respiratory syncytial virus infection)   [place]  [facility] [place], Salvadore Oxford, NP   1 year ago Anxiety and depression   [place] [facility] [place], Salvadore Oxford, NP       Future Appointments             In 4 weeks Parrett, Virgel Bouquet, NP [place] [facility] [place]   In 2 months Baity, Salvadore Oxford, NP [place] [facility], [facility]             budesonide-formoterol [facility]) 160-4.5 MCG/ACT inhaler [place] Med Name: BUDESONIDE-FORMOTEROL 160-4.5] 10.2 each 12    Sig: INHALE 2 PUFFS TWO TIMES A DAY.     Pulmonology:  Combination Products Passed - 12/12/2023  2:59 PM      Passed - Valid encounter within last 12 months    Recent Outpatient Visits           3 months ago Prediabetes    [facility] [place], Salvadore Oxford, NP   9 months ago Encounter for general adult medical examination with abnormal findings  [place] [facility] [place], Salvadore Oxford, NP   1 year ago RLS (restless legs syndrome)   [place] [place], Salvadore Oxford, NP   1 year ago RSV (respiratory syncytial virus infection)   [place] [facility] [place], Salvadore Oxford, NP   1 year ago Anxiety and depression   [place] [facility] [place], Salvadore Oxford, NP       Future Appointments             In 4 weeks Parrett, Virgel Bouquet, NP  [place] [facility] [place]   In 2 months Baity, Salvadore Oxford, NP [facility]

## 2023-12-13 NOTE — Telephone Encounter (Signed)
Requested Prescriptions  Pending Prescriptions Disp Refills   rOPINIRole (REQUIP) 2 MG tablet [Pharmacy Med Name: ROPINIROLE HCL 2 MG TABLET] 270 tablet 0    Sig: TAKE 1 TABLET BY MOUTH THREE TIMES A DAY     Neurology:  Parkinsonian Agents Passed - 12/13/2023  9:02 AM      Passed - Last BP in normal range    BP Readings from Last 1 Encounters:  08/29/23 112/78         Passed - Last Heart Rate in normal range    Pulse Readings from Last 1 Encounters:  08/29/23 86         Passed - Valid encounter within last 12 months    Recent Outpatient Visits           3 months ago Prediabetes   [place] [facility] [place], Salvadore Oxford, NP   9 months ago Encounter for general adult medical examination with abnormal findings   [place] [facility] [place], Salvadore Oxford, NP   1 year ago RLS (restless legs syndrome)   [facility] [place], Salvadore Oxford, NP   1 year ago RSV (respiratory syncytial virus infection)   [place] [facility] [place], Salvadore Oxford, NP   1 year ago Anxiety and depression   [place] [place], Salvadore Oxford, NP       Future Appointments             In 3 weeks Parrett, Virgel Bouquet, NP [place] [facility] [place]   In 2 months Baity, Salvadore Oxford, NP [place] [facility]

## 2023-12-25 DIAGNOSIS — H04301 Unspecified dacryocystitis of right lacrimal passage: Secondary | ICD-10-CM | POA: Diagnosis not present

## 2024-01-09 ENCOUNTER — Ambulatory Visit: Payer: Federal, State, Local not specified - PPO | Admitting: Adult Health

## 2024-01-26 ENCOUNTER — Other Ambulatory Visit: Payer: Self-pay | Admitting: Internal Medicine

## 2024-01-28 NOTE — Telephone Encounter (Signed)
 Requested Prescriptions  Pending Prescriptions Disp Refills   losartan (COZAAR) 100 MG tablet [Pharmacy Med Name: LOSARTAN POTASSIUM 100 MG TAB] 45 tablet 0    Sig: TAKE 1/2 TABLET BY MOUTH DAILY     Cardiovascular:  Angiotensin Receptor Blockers Passed - 01/28/2024  9:29 AM      Passed - Cr in normal range and within 180 days    Creat  Date Value Ref Range Status  08/29/2023 0.99 0.70 - 1.30 mg/dL Final         Passed - K in normal range and within 180 days    Potassium  Date Value Ref Range Status  08/29/2023 4.2 3.5 - 5.3 mmol/L Final         Passed - Patient is not pregnant      Passed - Last BP in normal range    BP Readings from Last 1 Encounters:  08/29/23 112/78         Passed - Valid encounter within last 6 months    Recent Outpatient Visits           5 months ago Prediabetes   [place] [facility] [place], Salvadore Oxford, NP   11 months ago Encounter for general adult medical examination with abnormal findings   [place] [facility] [place], Salvadore Oxford, NP   1 year ago RLS (restless legs syndrome)   [place] [facility] [place], Salvadore Oxford, NP   1 year ago RSV (respiratory syncytial virus infection)   [place] [facility] [place], Salvadore Oxford, NP   1 year ago Anxiety and depression   [place] [facility] [place], Salvadore Oxford, [place]

## 2024-02-11 DIAGNOSIS — J069 Acute upper respiratory infection, unspecified: Secondary | ICD-10-CM | POA: Diagnosis not present

## 2024-03-04 ENCOUNTER — Telehealth: Payer: Self-pay

## 2024-03-04 ENCOUNTER — Ambulatory Visit (INDEPENDENT_AMBULATORY_CARE_PROVIDER_SITE_OTHER): Payer: Self-pay | Admitting: Internal Medicine

## 2024-03-04 ENCOUNTER — Encounter: Payer: Self-pay | Admitting: Internal Medicine

## 2024-03-04 VITALS — BP 124/84 | Ht 69.0 in | Wt 267.4 lb

## 2024-03-04 DIAGNOSIS — E782 Mixed hyperlipidemia: Secondary | ICD-10-CM | POA: Diagnosis not present

## 2024-03-04 DIAGNOSIS — Z0001 Encounter for general adult medical examination with abnormal findings: Secondary | ICD-10-CM

## 2024-03-04 DIAGNOSIS — R7303 Prediabetes: Secondary | ICD-10-CM

## 2024-03-04 DIAGNOSIS — Z125 Encounter for screening for malignant neoplasm of prostate: Secondary | ICD-10-CM | POA: Diagnosis not present

## 2024-03-04 DIAGNOSIS — Z6839 Body mass index (BMI) 39.0-39.9, adult: Secondary | ICD-10-CM

## 2024-03-04 DIAGNOSIS — E66812 Obesity, class 2: Secondary | ICD-10-CM

## 2024-03-04 DIAGNOSIS — G4733 Obstructive sleep apnea (adult) (pediatric): Secondary | ICD-10-CM

## 2024-03-04 DIAGNOSIS — J4530 Mild persistent asthma, uncomplicated: Secondary | ICD-10-CM

## 2024-03-04 MED ORDER — ALPRAZOLAM 0.25 MG PO TABS: 0.2500 mg | ORAL_TABLET | Freq: Every day | ORAL | 0 refills | Status: AC | PRN

## 2024-03-04 MED ORDER — SEMAGLUTIDE-WEIGHT MANAGEMENT 0.5 MG/0.5ML ~~LOC~~ SOAJ: 0.5000 mg | SUBCUTANEOUS | 0 refills | Status: DC

## 2024-03-04 NOTE — Assessment & Plan Note (Signed)
 Will trial [email or web address] , sample provided today Encouraged diet and exercise for weight loss

## 2024-03-04 NOTE — Patient Instructions (Signed)
 Health Maintenance, Male  Adopting a healthy lifestyle and getting preventive care are important in promoting health and wellness. Ask your health care provider about:  The right schedule for you to have regular tests and exams.  Things you can do on your own to prevent diseases and keep yourself healthy.  What should I know about diet, weight, and exercise?  Eat a healthy diet    Eat a diet that includes plenty of vegetables, fruits, low-fat dairy products, and lean protein.  Do not eat a lot of foods that are high in solid fats, added sugars, or sodium.  Maintain a healthy weight  Body mass index (BMI) is a measurement that can be used to identify possible weight problems. It estimates body fat based on height and weight. Your health care provider can help determine your BMI and help you achieve or maintain a healthy weight.  Get regular exercise  Get regular exercise. This is one of the most important things you can do for your health. Most adults should:  Exercise for at least 150 minutes each week. The exercise should increase your heart rate and make you sweat (moderate-intensity exercise).  Do strengthening exercises at least twice a week. This is in addition to the moderate-intensity exercise.  Spend less time sitting. Even light physical activity can be beneficial.  Watch cholesterol and blood lipids  Have your blood tested for lipids and cholesterol at 53 years of age, then have this test every 5 years.  You may need to have your cholesterol levels checked more often if:  Your lipid or cholesterol levels are high.  You are older than 53 years of age.  You are at high risk for heart disease.  What should I know about cancer screening?  Many types of cancers can be detected early and may often be prevented. Depending on your health history and family history, you may need to have cancer screening at various ages. This may include screening for:  Colorectal cancer.  Prostate cancer.  Skin cancer.  Lung  cancer.  What should I know about heart disease, diabetes, and high blood pressure?  Blood pressure and heart disease  High blood pressure causes heart disease and increases the risk of stroke. This is more likely to develop in people who have high blood pressure readings or are overweight.  Talk with your health care provider about your target blood pressure readings.  Have your blood pressure checked:  Every 3-5 years if you are 9-95 years of age.  Every year if you are 85 years old or older.  If you are between the ages of 29 and 29 and are a current or former smoker, ask your health care provider if you should have a one-time screening for abdominal aortic aneurysm (AAA).  Diabetes  Have regular diabetes screenings. This checks your fasting blood sugar level. Have the screening done:  Once every three years after age 23 if you are at a normal weight and have a low risk for diabetes.  More often and at a younger age if you are overweight or have a high risk for diabetes.  What should I know about preventing infection?  Hepatitis B  If you have a higher risk for hepatitis B, you should be screened for this virus. Talk with your health care provider to find out if you are at risk for hepatitis B infection.  Hepatitis C  Blood testing is recommended for:  Everyone born from [birth year] through 1965.  Anyone  with known risk factors for hepatitis C.  Sexually transmitted infections (STIs)  You should be screened each year for STIs, including gonorrhea and chlamydia, if:  You are sexually active and are younger than 53 years of age.  You are older than 53 years of age and your health care provider tells you that you are at risk for this type of infection.  Your sexual activity has changed since you were last screened, and you are at increased risk for chlamydia or gonorrhea. Ask your health care provider if you are at risk.  Ask your health care provider about whether you are at high risk for HIV. Your health care provider  may recommend a prescription medicine to help prevent HIV infection. If you choose to take medicine to prevent HIV, you should first get tested for HIV. You should then be tested every 3 months for as long as you are taking the medicine.  Follow these instructions at home:  Alcohol use  Do not drink alcohol if your health care provider tells you not to drink.  If you drink alcohol:  Limit how much you have to 0-2 drinks a day.  Know how much alcohol is in your drink. In the [place] one drink equals one 12 oz bottle of beer (355 mL), one 5 oz glass of wine (148 mL), or one 1 oz glass of hard liquor (44 mL).  Lifestyle  Do not use any products that contain nicotine or tobacco. These products include cigarettes, chewing tobacco, and vaping devices, such as e-cigarettes. If you need help quitting, ask your health care provider.  Do not use street drugs.  Do not share needles.  Ask your health care provider for help if you need support or information about quitting drugs.  General instructions  Schedule regular health, dental, and eye exams.  Stay current with your vaccines.  Tell your health care provider if:  You often feel depressed.  You have ever been abused or do not feel safe at home.  Summary  Adopting a healthy lifestyle and getting preventive care are important in promoting health and wellness.  Follow your health care provider's instructions about healthy diet, exercising, and getting tested or screened for diseases.  Follow your health care provider's instructions on monitoring your cholesterol and blood pressure.  This information is not intended to replace advice given to you by your health care provider. Make sure you discuss any questions you have with your health care provider.  Document Revised: 03/20/2021 Document Reviewed: 03/20/2021  Elsevier Patient Education  2024 [employer].

## 2024-03-04 NOTE — Progress Notes (Signed)
 Subjective:    Patient ID: Jonathan [name], male    DOB: 05-16-1971, 53 y.o.   MRN: [id number]  HPI  Patient presents to clinic today for his annual exam.  Flu: Never Tetanus: 02/2016 COVID: Moderna x 3 Shingrix: Never PSA screening: 01/2023 Colon screening: 11/2021 Vision screening: annually Dentist: biannually  Diet: He does eat meat. He consumes fruits and veggies. He does eat some fried foods. He drinks mostly water . Exercise: Walking  Review of Systems     Past Medical History:  Diagnosis Date  . Anxiety   . GERD (gastroesophageal reflux disease)   . Hypertension     Current Outpatient Medications  Medication Sig Dispense Refill  . albuterol  (VENTOLIN  HFA) 108 (90 Base) MCG/ACT inhaler INHALE 2 PUFFS BY MOUTH EVERY 6 HOURS AS NEEDED (Patient not taking: Reported on 08/29/2023) 18 each 2  . ALPRAZolam  (XANAX ) 0.25 MG tablet Take 1 tablet (0.25 mg total) by mouth daily as needed for anxiety. (Patient not taking: Reported on 08/29/2023) 10 tablet 0  . aspirin  EC 81 MG tablet Take 1 tablet (81 mg total) by mouth daily. Swallow whole. 30 tablet 12  . budesonide-formoterol (SYMBICORT) 160-4.5 MCG/ACT inhaler INHALE 2 PUFFS TWO TIMES A DAY. 10.2 each 2  . citalopram  (CELEXA ) 40 MG tablet TAKE 1 TABLET BY MOUTH EVERY DAY 90 tablet 0  . ferrous sulfate  325 (65 FE) MG tablet TAKE 1 TABLET BY MOUTH EVERY DAY WITH BREAKFAST 90 tablet 0  . hydrochlorothiazide  (MICROZIDE ) 12.5 MG capsule TAKE 1 CAPSULE BY MOUTH EVERY DAY 90 capsule 1  . losartan  (COZAAR ) 100 MG tablet TAKE 1/2 TABLET BY MOUTH DAILY 45 tablet 0  . Multiple Vitamin (MULTIVITAMIN) tablet Take 1 tablet by mouth daily.    . omeprazole  (PRILOSEC) 40 MG capsule TAKE 1 CAPSULE BY MOUTH EVERY DAY 90 capsule 0  . rOPINIRole  (REQUIP ) 2 MG tablet TAKE 1 TABLET BY MOUTH THREE TIMES A DAY 270 tablet 0   No current facility-administered medications for this visit.    No Known Allergies  Family History  Problem Relation  Age of Onset  . Breast cancer Mother   . Hypertension Mother   . Prostate cancer Father     Social History   Socioeconomic History  . Marital status: Married    Spouse name: Not on file  . Number of children: 0  . Years of education: Not on file  . Highest education level: Not on file  Occupational History  . Not on file  Tobacco Use  . Smoking status: Former    Current packs/day: 0.00    Types: Cigarettes    Quit date: 1980    Years since quitting: 45.3  . Smokeless tobacco: Current    Types: Chew  Vaping Use  . Vaping status: Never Used  Substance and Sexual Activity  . Alcohol use: No    Alcohol/week: 0.0 standard drinks of alcohol  . Drug use: Never  . Sexual activity: Not on file  Other Topics Concern  . Not on file  Social History Narrative  . Not on file   Social Drivers of Health   Financial Resource Strain: Low Risk  (04/08/2022)   Received from [facility]   Overall Financial Resource Strain (CARDIA)   . Difficulty of Paying Living Expenses: Not hard at all  Food Insecurity: No Food Insecurity (04/08/2022)   Received from [facility]   Hunger Vital Sign   . Worried About Programme researcher, broadcasting/film/video in  the Last Year: Never true   . Ran Out of Food in the Last Year: Never true  Transportation Needs: No Transportation Needs (04/08/2022)   Received from [facility]   [facility] - Transportation   . Lack of Transportation (Medical): No   . Lack of Transportation (Non-Medical): No  Physical Activity: Not on file  Stress: Not on file  Social Connections: Not on file  Intimate Partner Violence: Not on file     Constitutional: Pt reports abnormal weight gain. Denies fever, malaise, fatigue, headache.  HEENT:  Denies eye pain, eye redness, ear pain, ringing in the ears, wax buildup, runny nose, nasal congestion, bloody nose, or sore throat. Respiratory: Denies difficulty breathing, shortness of breath, cough or sputum production.   Cardiovascular: Denies  chest pain, chest tightness, palpitations or swelling in the hands or feet.  Gastrointestinal: Denies abdominal pain, bloating, constipation, diarrhea or blood in the stool.  GU: Denies urgency, frequency, pain with urination, burning sensation, blood in urine, odor or discharge. Musculoskeletal: Denies decrease in range of motion, difficulty with gait, muscle pain or joint pain and swelling.  Skin: Denies redness, rashes, lesions or ulcercations.  Neurological: Patient reports restless legs.  Denies dizziness, difficulty with memory, difficulty with speech or problems with balance and coordination.  Psych: Patient has a history of anxiety and depression.  Denies SI/HI.  No other specific complaints in a complete review of systems (except as listed in HPI above).  Objective:   Physical Exam   BP 124/84 (BP Location: Left Arm, Patient Position: Sitting, Cuff Size: Large)   Ht 5\' 9"  (1.753 m)   Wt 267 lb 6.4 oz (121.3 kg)   BMI 39.49 kg/m    Wt Readings from Last 3 Encounters:  08/29/23 261 lb (118.4 kg)  07/11/23 265 lb 6.4 oz (120.4 kg)  04/10/23 255 lb 12.8 oz (116 kg)    General: Appears his stated age, obese, in NAD. Skin: Warm, dry and intact. No ulcerations noted. HEENT: Head: normal shape and size; Eyes: sclera white, no icterus, conjunctiva pink, PERRLA and EOMs intact;    Neck:  Neck supple, trachea midline. No masses, lumps or thyromegaly present.  Cardiovascular: Normal rate and rhythm. S1,S2 noted.  No murmur, rubs or gallops noted. No JVD or BLE edema. No carotid bruits noted. Pulmonary/Chest: Normal effort and positive vesicular breath sounds. No respiratory distress. No wheezes, rales or ronchi noted.  Abdomen: Normal bowel sounds.  Musculoskeletal: Strength 5/5 BUE/BLE. No difficulty with gait.  Neurological: Alert and oriented. Cranial nerves II-XII grossly intact. Coordination normal.  Psychiatric: Mood and affect normal. Behavior is normal. Judgment and thought  content normal.    BMET    Component Value Date/Time   NA 139 08/29/2023 1057   K 4.2 08/29/2023 1057   CL 103 08/29/2023 1057   CO2 28 08/29/2023 1057   GLUCOSE 92 08/29/2023 1057   BUN 14 08/29/2023 1057   CREATININE 0.99 08/29/2023 1057   CALCIUM 9.2 08/29/2023 1057   GFRNONAA 95.90 09/13/2010 1103    Lipid Panel     Component Value Date/Time   CHOL 160 08/29/2023 1057   TRIG 100 08/29/2023 1057   HDL 37 (L) 08/29/2023 1057   CHOLHDL 4.3 08/29/2023 1057   VLDL 51.4 (H) 01/31/2021 1035   LDLCALC 103 (H) 08/29/2023 1057    CBC    Component Value Date/Time   WBC 6.4 08/29/2023 1057   RBC 5.58 08/29/2023 1057   HGB 15.8 08/29/2023 1057  HCT 48.2 08/29/2023 1057   PLT 195 08/29/2023 1057   MCV 86.4 08/29/2023 1057   MCH 28.3 08/29/2023 1057   MCHC 32.8 08/29/2023 1057   RDW 13.0 08/29/2023 1057   LYMPHSABS 1.9 02/27/2016 1032   MONOABS 0.6 02/27/2016 1032   EOSABS 0.1 02/27/2016 1032   BASOSABS 0.0 02/27/2016 1032    Hgb A1C Lab Results  Component Value Date   HGBA1C 6.0 (H) 08/29/2023           Assessment & Plan:   Preventative Health Maintenance:  Encouraged him to get a flu shot in the fall Tetanus UTD COVID-vaccine UTD Discussed Shingrix vaccine, he will check coverage with his insurance company schedule visit if you would like to have this done Colon screening UTD Encouraged him to consume a balanced diet and exercise regimen Advised him to see an eye doctor and dentist annually We will check CBC, c-Met, lipid, A1c and PSA today  RTC in 6 months, follow-up chronic conditions Helayne Lo, NP

## 2024-03-04 NOTE — Telephone Encounter (Signed)
 Jonathan [name] (Key: [id number]) Rx #: [id number] Wegovy  0.5MG /0.5ML auto-injectors Form [employer] Electronic PA Form [id number] NCPDP) Created 5 hours ago Sent to Plan 4 minutes ago Plan Response 4 minutes ago Submit Clinical Questions less than a minute ago Determination Favorable less than a minute ago Your prior authorization for Wegovy  has been approved! More Info Personalized support and financial assistance may be available through the [employer] program. For more information, and to see program requirements, click on the More Info button to the right.  Message from plan: Your PA request has been approved. Additional information will be provided in the approval communication. (Message 1145). Authorization Expiration Date: August 31, 2024.

## 2024-03-09 ENCOUNTER — Ambulatory Visit: Admitting: Sleep Medicine

## 2024-03-09 ENCOUNTER — Encounter: Payer: Self-pay | Admitting: Sleep Medicine

## 2024-03-09 VITALS — BP 118/80 | HR 88 | Temp 98.0°F | Ht 69.0 in | Wt 262.8 lb

## 2024-03-09 DIAGNOSIS — I10 Essential (primary) hypertension: Secondary | ICD-10-CM

## 2024-03-09 DIAGNOSIS — E66812 Obesity, class 2: Secondary | ICD-10-CM

## 2024-03-09 DIAGNOSIS — G4733 Obstructive sleep apnea (adult) (pediatric): Secondary | ICD-10-CM

## 2024-03-09 DIAGNOSIS — Z6838 Body mass index (BMI) 38.0-38.9, adult: Secondary | ICD-10-CM | POA: Diagnosis not present

## 2024-03-09 DIAGNOSIS — Z87891 Personal history of nicotine dependence: Secondary | ICD-10-CM

## 2024-03-09 NOTE — Patient Instructions (Signed)

## 2024-03-09 NOTE — Progress Notes (Signed)
 Name:Jonathan [name] MRN: [id number] DOB: 20-Sep-1971   CHIEF COMPLAINT:  CPAP F/U   HISTORY OF PRESENT ILLNESS:  Jonathan [name] is a 53 y.o. w/ a h/o OSA, obesity, HTN, RLS, GERD, anxiety and obesity who presents for CPAP follow up visit. Reports difficulty using CPAP therapy due to pressure discomfort. Reports awakening in the night due to increased pressure. Reports using a full face mask, which is comfortable. Reports feeling refreshed upon awakening with CPAP therapy.   Reports a 20 lb weight gain over the last few years. Admits to RLS symptoms. Denies morning headaches, dream enactment, cataplexy, hypnagogic or hypnapompic hallucinations. Reports a family history of sleep apnea. Denies drowsy driving. Drinks 1-2 sodas daily, occasional alcohol use, former smoker, denies illicit drug use.   Bedtime 10 pm Sleep onset 30 mins Rise time 6:30 am   EPWORTH SLEEP SCORE 16    03/09/2024   11:00 AM 01/26/2022   10:00 AM 12/13/2021   10:00 AM  Results of the Epworth flowsheet  Sitting and reading 3 1 0  Watching TV 3 1 1   Sitting, inactive in a public place (e.g. a theatre or a meeting) 2 0 0  As a passenger in a car for an hour without a break 3 1 1   Lying down to rest in the afternoon when circumstances permit 3 2 3   Sitting and talking to someone 0 0 0  Sitting quietly after a lunch without alcohol 2 1 1   In a car, while stopped for a few minutes in traffic 0 0 0  Total score 16 6 6      PAST MEDICAL HISTORY :   has a past medical history of Anxiety, GERD (gastroesophageal reflux disease), and Hypertension.  has a past surgical history that includes Wisdom tooth extraction (1997); Colonoscopy with propofol  (N/A, 12/04/2021); and polypectomy (N/A, 12/04/2021). Prior to Admission medications   Medication Sig Start Date End Date Taking? Authorizing Provider  albuterol  (VENTOLIN  HFA) 108 (90 Base) MCG/ACT inhaler INHALE 2 PUFFS BY MOUTH EVERY 6 HOURS AS NEEDED 05/07/22  Yes  Baity, Rankin Buzzard, NP  ALPRAZolam  (XANAX ) 0.25 MG tablet Take 1 tablet (0.25 mg total) by mouth daily as needed for anxiety. 03/04/24  Yes Carollynn Cirri, NP  aspirin  EC 81 MG tablet Take 1 tablet (81 mg total) by mouth daily. Swallow whole. 07/18/22  Yes Baity, Rankin Buzzard, NP  budesonide-formoterol (SYMBICORT) 160-4.5 MCG/ACT inhaler INHALE 2 PUFFS TWO TIMES A DAY. 12/13/23  Yes Baity, Rankin Buzzard, NP  citalopram  (CELEXA ) 40 MG tablet TAKE 1 TABLET BY MOUTH EVERY DAY 12/12/23  Yes Carollynn Cirri, NP  hydrochlorothiazide  (MICROZIDE ) 12.5 MG capsule TAKE 1 CAPSULE BY MOUTH EVERY DAY 10/30/23  Yes Carollynn Cirri, NP  losartan  (COZAAR ) 100 MG tablet TAKE 1/2 TABLET BY MOUTH DAILY 01/28/24  Yes Carollynn Cirri, NP  Multiple Vitamin (MULTIVITAMIN) tablet Take 1 tablet by mouth daily.   Yes [provider]  omeprazole  (PRILOSEC) 40 MG capsule TAKE 1 CAPSULE BY MOUTH EVERY DAY 12/12/23  Yes Baity, Rankin Buzzard, NP  rOPINIRole  (REQUIP ) 2 MG tablet TAKE 1 TABLET BY MOUTH THREE TIMES A DAY 12/13/23  Yes Carollynn Cirri, NP  Semaglutide -Weight Management 0.5 MG/0.5ML SOAJ Inject 0.5 mg into the skin once a week. 03/04/24  Yes Baity, Rankin Buzzard, NP  ferrous sulfate  325 (65 FE) MG tablet TAKE 1 TABLET BY MOUTH EVERY DAY WITH BREAKFAST Patient not taking: Reported on 03/09/2024 11/01/23  Parrett, Tammy S, NP   No Known Allergies  FAMILY HISTORY:  family history includes Breast cancer in his mother; Hypertension in his mother; Prostate cancer in his father. SOCIAL HISTORY:  reports that he quit smoking about 45 years ago. His smoking use included cigarettes. His smokeless tobacco use includes chew. He reports that he does not drink alcohol and does not use drugs.   Review of Systems:  Gen:  Denies  fever, sweats, chills weight loss  HEENT: Denies blurred vision, double vision, ear pain, eye pain, hearing loss, nose bleeds, sore throat Cardiac:  No dizziness, chest pain or heaviness, chest tightness,edema, No  JVD Resp:   No cough, -sputum production, -shortness of breath,-wheezing, -hemoptysis,  Gi: Denies swallowing difficulty, stomach pain, nausea or vomiting, diarrhea, constipation, bowel incontinence Gu:  Denies bladder incontinence, burning urine Ext:   Denies Joint pain, stiffness or swelling Skin: Denies  skin rash, easy bruising or bleeding or hives Endoc:  Denies polyuria, polydipsia , polyphagia or weight change Psych:   Denies depression, insomnia or hallucinations  Other:  All other systems negative  VITAL SIGNS: BP 118/80 (BP Location: Right Arm, Patient Position: Sitting, Cuff Size: Large)   Pulse 88   Temp 98 F (36.7 C) (Oral)   Ht 5\' 9"  (1.753 m)   Wt 262 lb 12.8 oz (119.2 kg)   SpO2 98%   BMI 38.81 kg/m    Physical Examination:   General Appearance: No distress  EYES PERRLA, EOM intact.   NECK Supple, No JVD Pulmonary: normal breath sounds, No wheezing.  CardiovascularNormal S1,S2.  No m/r/g.   Abdomen: Benign, Soft, non-tender. Skin:   warm, no rashes, no ecchymosis  Extremities: normal, no cyanosis, clubbing. Neuro:without focal findings,  speech normal  PSYCHIATRIC: Mood, affect within normal limits.   ASSESSMENT AND PLAN  OSA Counseled patient on increasing CPAP compliance. Due to elevated AHI I suspect that OSA is likely present due to clinical presentation. Discussed the consequences of untreated sleep apnea. Advised not to drive drowsy for safety of patient and others. Will complete further evaluation with a home sleep study and follow up to review results.    HTN Stable, on current management. Following with PCP.   Obesity Counseled patient on diet and lifestyle modification.    MEDICATION ADJUSTMENTS/LABS AND TESTS ORDERED: Recommend Sleep Study   Patient  satisfied with Plan of action and management. All questions answered  Follow up to review HST results and treatment plan.   I spent a total of 46 minutes reviewing chart data,  face-to-face evaluation with the patient, counseling and coordination of care as detailed above.    Perri Lamagna, M.D.  Sleep Medicine [facility]

## 2024-03-11 ENCOUNTER — Other Ambulatory Visit: Payer: Self-pay | Admitting: Internal Medicine

## 2024-03-13 NOTE — Telephone Encounter (Signed)
 Requested Prescriptions  Pending Prescriptions Disp Refills   omeprazole  (PRILOSEC) 40 MG capsule [Pharmacy Med Name: OMEPRAZOLE  DR 40 MG CAPSULE] 90 capsule 0    Sig: TAKE 1 CAPSULE BY MOUTH EVERY DAY     Gastroenterology: Proton Pump Inhibitors Failed - 03/13/2024  1:48 PM      Failed - Valid encounter within last 12 months    Recent Outpatient Visits           1 week ago Encounter for general adult medical examination with abnormal findings   [place] [facility] [place], [place] W, NP               citalopram  (CELEXA ) 40 MG tablet [Pharmacy Med Name: CITALOPRAM  HBR 40 MG TABLET] 90 tablet 0    Sig: TAKE 1 TABLET BY MOUTH EVERY DAY     Psychiatry:  Antidepressants - SSRI Failed - 03/13/2024  1:48 PM      Failed - Valid encounter within last 6 months    Recent Outpatient Visits           1 week ago Encounter for general adult medical examination with abnormal findings   [facility] [place], [place] W, NP              Passed - Completed PHQ-2 or PHQ-9 in the last 360 days       rOPINIRole  (REQUIP ) 2 MG tablet [Pharmacy Med Name: ROPINIROLE  HCL 2 MG TABLET] 270 tablet 0    Sig: TAKE 1 TABLET BY MOUTH THREE TIMES A DAY     Neurology:  Parkinsonian Agents Failed - 03/13/2024  1:48 PM      Failed - Valid encounter within last 12 months    Recent Outpatient Visits           1 week ago Encounter for general adult medical examination with abnormal findings   [place] [facility] [place], [place] W, NP              Passed - Last BP in normal range    BP Readings from Last 1 Encounters:  03/09/24 118/80         Passed - Last Heart Rate in normal range    Pulse Readings from Last 1 Encounters:  03/09/24 88

## 2024-04-25 ENCOUNTER — Other Ambulatory Visit: Payer: Self-pay | Admitting: Internal Medicine

## 2024-04-26 ENCOUNTER — Other Ambulatory Visit: Payer: Self-pay | Admitting: Internal Medicine

## 2024-04-28 NOTE — Telephone Encounter (Signed)
 Requested medication (s) are due for refill today: yes   Requested medication (s) are on the active medication list: yes   Last refill:  01/28/24 #45 0 refills  Future visit scheduled: yes 09/02/24  Notes to clinic:  protocol failed last labs 08/29/23. Last OV 03/04/24. Do you want to refill Rx?     Requested Prescriptions  Pending Prescriptions Disp Refills   losartan  (COZAAR ) 100 MG tablet [Pharmacy Med Name: LOSARTAN  POTASSIUM 100 MG TAB] 45 tablet 0    Sig: TAKE 1/2 TABLET BY MOUTH DAILY     Cardiovascular:  Angiotensin Receptor Blockers Failed - 04/28/2024  3:36 PM      Failed - Cr in normal range and within 180 days    Creat  Date Value Ref Range Status  08/29/2023 0.99 0.70 - 1.30 mg/dL Final         Failed - K in normal range and within 180 days    Potassium  Date Value Ref Range Status  08/29/2023 4.2 3.5 - 5.3 mmol/L Final         Failed - Valid encounter within last 6 months    Recent Outpatient Visits           1 month ago Encounter for general adult medical examination with abnormal findings   [place] [facility] [place], Rankin Buzzard, [place]              Passed - Patient is not pregnant      Passed - Last BP in normal range    BP Readings from Last 1 Encounters:  03/09/24 118/80

## 2024-04-28 NOTE — Telephone Encounter (Signed)
 Requested medication (s) are due for refill today: yes  Requested medication (s) are on the active medication list: yes  Last refill:  10/30/23 #90 1 RF  Future visit scheduled: yes  Notes to clinic:(Overdue lab work)  was ordered lab work in April and has not had done, called pt and LM on VM to call and make appt. Also sent MyChart message to make appt.    Requested Prescriptions  Pending Prescriptions Disp Refills   hydrochlorothiazide  (MICROZIDE ) 12.5 MG capsule [Pharmacy Med Name: HYDROCHLOROTHIAZIDE  12.5 MG CP] 90 capsule 1    Sig: TAKE 1 CAPSULE BY MOUTH EVERY DAY     Cardiovascular: Diuretics - Thiazide Failed - 04/28/2024 10:28 AM      Failed - Cr in normal range and within 180 days    Creat  Date Value Ref Range Status  08/29/2023 0.99 0.70 - 1.30 mg/dL Final         Failed - K in normal range and within 180 days    Potassium  Date Value Ref Range Status  08/29/2023 4.2 3.5 - 5.3 mmol/L Final         Failed - Na in normal range and within 180 days    Sodium  Date Value Ref Range Status  08/29/2023 139 135 - 146 mmol/L Final         Failed - Valid encounter within last 6 months    Recent Outpatient Visits           1 month ago Encounter for general adult medical examination with abnormal findings   [place] [facility] [place], Rankin Buzzard, NP              Passed - Last BP in normal range    BP Readings from Last 1 Encounters:  03/09/24 118/80

## 2024-04-29 ENCOUNTER — Other Ambulatory Visit

## 2024-04-29 ENCOUNTER — Other Ambulatory Visit: Payer: Self-pay

## 2024-04-29 DIAGNOSIS — E782 Mixed hyperlipidemia: Secondary | ICD-10-CM

## 2024-04-29 DIAGNOSIS — Z0001 Encounter for general adult medical examination with abnormal findings: Secondary | ICD-10-CM | POA: Diagnosis not present

## 2024-04-29 DIAGNOSIS — R7303 Prediabetes: Secondary | ICD-10-CM

## 2024-04-29 DIAGNOSIS — Z125 Encounter for screening for malignant neoplasm of prostate: Secondary | ICD-10-CM

## 2024-04-30 ENCOUNTER — Ambulatory Visit: Payer: Self-pay | Admitting: Internal Medicine

## 2024-04-30 LAB — CBC
HCT: 45.6 % (ref 38.5–50.0)
Hemoglobin: 14.8 g/dL (ref 13.2–17.1)
MCH: 28.4 pg (ref 27.0–33.0)
MCHC: 32.5 g/dL (ref 32.0–36.0)
MCV: 87.4 fL (ref 80.0–100.0)
MPV: 10.1 fL (ref 7.5–12.5)
Platelets: 184 10*3/uL (ref 140–400)
RBC: 5.22 10*6/uL (ref 4.20–5.80)
RDW: 12.9 % (ref 11.0–15.0)
WBC: 7 10*3/uL (ref 3.8–10.8)

## 2024-04-30 LAB — COMPREHENSIVE METABOLIC PANEL WITH GFR
AG Ratio: 2 (calc) (ref 1.0–2.5)
ALT: 25 U/L (ref 9–46)
AST: 18 U/L (ref 10–35)
Albumin: 4.3 g/dL (ref 3.6–5.1)
Alkaline phosphatase (APISO): 70 U/L (ref 35–144)
BUN: 17 mg/dL (ref 7–25)
CO2: 28 mmol/L (ref 20–32)
Calcium: 9.3 mg/dL (ref 8.6–10.3)
Chloride: 101 mmol/L (ref 98–110)
Creat: 0.88 mg/dL (ref 0.70–1.30)
Globulin: 2.2 g/dL (ref 1.9–3.7)
Glucose, Bld: 113 mg/dL (ref 65–139)
Potassium: 3.7 mmol/L (ref 3.5–5.3)
Sodium: 137 mmol/L (ref 135–146)
Total Bilirubin: 0.6 mg/dL (ref 0.2–1.2)
Total Protein: 6.5 g/dL (ref 6.1–8.1)
eGFR: 103 mL/min/{1.73_m2} (ref >=60)

## 2024-04-30 LAB — LIPID PANEL
Cholesterol: 144 mg/dL (ref <200)
HDL: 38 mg/dL — ABNORMAL LOW (ref >=40)
LDL Cholesterol (Calc): 82 mg/dL
Non-HDL Cholesterol (Calc): 106 mg/dL (ref <130)
Total CHOL/HDL Ratio: 3.8 (calc) (ref <5.0)
Triglycerides: 139 mg/dL (ref <150)

## 2024-04-30 LAB — HEMOGLOBIN A1C
Hgb A1c MFr Bld: 5.8 % — ABNORMAL HIGH (ref <5.7)
Mean Plasma Glucose: 120 mg/dL
eAG (mmol/L): 6.6 mmol/L

## 2024-04-30 LAB — PSA: PSA: 0.4 ng/mL (ref <=4.00)

## 2024-05-04 ENCOUNTER — Other Ambulatory Visit: Payer: Self-pay | Admitting: Internal Medicine

## 2024-05-04 NOTE — Telephone Encounter (Unsigned)
 Copied from CRM [id number]. Topic: Clinical - Medication Refill >> May 04, 2024  1:36 PM Carlatta H wrote: Medication: hydrochlorothiazide  (MICROZIDE ) 12.5 MG capsule  Has the patient contacted their pharmacy? No (Agent: If no, request that the patient contact the pharmacy for the refill. If patient does not wish to contact the pharmacy document the reason why and proceed with request.) (Agent: If yes, when and what did the pharmacy advise?)  This is the patient's preferred pharmacy:  [id number] [place], [place] - [address] [place] [address] Phone: [phone] Fax: [phone]    Is this the correct pharmacy for this prescription? Yes If no, delete pharmacy and type the correct one.   Has the prescription been filled recently? No  Is the patient out of the medication? Yes  Has the patient been seen for an appointment in the last year OR does the patient have an upcoming appointment? Yes  Can we respond through MyChart? Yes  Agent: Please be advised that Rx refills may take up to 3 business days. We ask that you follow-up with your pharmacy.

## 2024-05-06 DIAGNOSIS — H04411 Chronic dacryocystitis of right lacrimal passage: Secondary | ICD-10-CM | POA: Diagnosis not present

## 2024-05-06 MED ORDER — HYDROCHLOROTHIAZIDE 12.5 MG PO CAPS: 12.5000 mg | ORAL_CAPSULE | Freq: Every day | ORAL | 1 refills | Status: DC

## 2024-05-06 NOTE — Telephone Encounter (Signed)
 Requested Prescriptions  Pending Prescriptions Disp Refills   hydrochlorothiazide  (MICROZIDE ) 12.5 MG capsule 90 capsule 1    Sig: Take by mouth daily.     Cardiovascular: Diuretics - Thiazide Failed - 05/06/2024  9:36 AM      Failed - Valid encounter within last 6 months    Recent Outpatient Visits           2 months ago Encounter for general adult medical examination with abnormal findings   [place] [facility] [place], [place] W, NP              Passed - Cr in normal range and within 180 days    Creat  Date Value Ref Range Status  04/29/2024 0.88 0.70 - 1.30 mg/dL Final         Passed - K in normal range and within 180 days    Potassium  Date Value Ref Range Status  04/29/2024 3.7 3.5 - 5.3 mmol/L Final         Passed - Na in normal range and within 180 days    Sodium  Date Value Ref Range Status  04/29/2024 137 135 - 146 mmol/L Final         Passed - Last BP in normal range    BP Readings from Last 1 Encounters:  03/09/24 118/80

## 2024-05-21 DIAGNOSIS — H527 Unspecified disorder of refraction: Secondary | ICD-10-CM | POA: Diagnosis not present

## 2024-05-21 DIAGNOSIS — D23112 Other benign neoplasm of skin of right lower eyelid, including canthus: Secondary | ICD-10-CM | POA: Diagnosis not present

## 2024-05-21 DIAGNOSIS — H0102A Squamous blepharitis right eye, upper and lower eyelids: Secondary | ICD-10-CM | POA: Diagnosis not present

## 2024-05-21 DIAGNOSIS — H0102B Squamous blepharitis left eye, upper and lower eyelids: Secondary | ICD-10-CM | POA: Diagnosis not present

## 2024-05-28 DIAGNOSIS — Z85828 Personal history of other malignant neoplasm of skin: Secondary | ICD-10-CM | POA: Diagnosis not present

## 2024-05-28 DIAGNOSIS — H5789 Other specified disorders of eye and adnexa: Secondary | ICD-10-CM | POA: Diagnosis not present

## 2024-05-28 DIAGNOSIS — R220 Localized swelling, mass and lump, head: Secondary | ICD-10-CM | POA: Diagnosis not present

## 2024-05-28 DIAGNOSIS — I10 Essential (primary) hypertension: Secondary | ICD-10-CM | POA: Diagnosis not present

## 2024-05-28 DIAGNOSIS — H04301 Unspecified dacryocystitis of right lacrimal passage: Secondary | ICD-10-CM | POA: Diagnosis not present

## 2024-05-28 DIAGNOSIS — K219 Gastro-esophageal reflux disease without esophagitis: Secondary | ICD-10-CM | POA: Diagnosis not present

## 2024-05-28 DIAGNOSIS — Z7902 Long term (current) use of antithrombotics/antiplatelets: Secondary | ICD-10-CM | POA: Diagnosis not present

## 2024-05-28 DIAGNOSIS — Z79899 Other long term (current) drug therapy: Secondary | ICD-10-CM | POA: Diagnosis not present

## 2024-06-02 DIAGNOSIS — H02842 Edema of right lower eyelid: Secondary | ICD-10-CM | POA: Diagnosis not present

## 2024-06-02 DIAGNOSIS — I10 Essential (primary) hypertension: Secondary | ICD-10-CM | POA: Diagnosis not present

## 2024-06-02 DIAGNOSIS — Z7902 Long term (current) use of antithrombotics/antiplatelets: Secondary | ICD-10-CM | POA: Diagnosis not present

## 2024-06-02 DIAGNOSIS — H04301 Unspecified dacryocystitis of right lacrimal passage: Secondary | ICD-10-CM | POA: Diagnosis not present

## 2024-06-02 DIAGNOSIS — G4733 Obstructive sleep apnea (adult) (pediatric): Secondary | ICD-10-CM | POA: Diagnosis not present

## 2024-06-02 DIAGNOSIS — J42 Unspecified chronic bronchitis: Secondary | ICD-10-CM | POA: Diagnosis not present

## 2024-06-03 DIAGNOSIS — H04309 Unspecified dacryocystitis of unspecified lacrimal passage: Secondary | ICD-10-CM | POA: Diagnosis not present

## 2024-06-05 DIAGNOSIS — H04301 Unspecified dacryocystitis of right lacrimal passage: Secondary | ICD-10-CM | POA: Diagnosis not present

## 2024-06-09 ENCOUNTER — Ambulatory Visit: Admitting: Sleep Medicine

## 2024-06-11 ENCOUNTER — Other Ambulatory Visit: Payer: Self-pay | Admitting: Internal Medicine

## 2024-06-11 NOTE — Telephone Encounter (Signed)
 Requested Prescriptions  Pending Prescriptions Disp Refills   citalopram  (CELEXA ) 40 MG tablet [Pharmacy Med Name: CITALOPRAM  HBR 40 MG TABLET] 90 tablet 0    Sig: TAKE 1 TABLET BY MOUTH EVERY DAY     Psychiatry:  Antidepressants - SSRI Passed - 06/11/2024  3:30 PM      Passed - Completed PHQ-2 or PHQ-9 in the last 360 days      Passed - Valid encounter within last 6 months    Recent Outpatient Visits           3 months ago Encounter for general adult medical examination with abnormal findings   [place] [facility] [place], [place] W, NP               rOPINIRole  (REQUIP ) 2 MG tablet [Pharmacy Med Name: ROPINIROLE  HCL 2 MG TABLET] 270 tablet 0    Sig: TAKE 1 TABLET BY MOUTH THREE TIMES A DAY     Neurology:  Parkinsonian Agents Passed - 06/11/2024  3:30 PM      Passed - Last BP in normal range    BP Readings from Last 1 Encounters:  03/09/24 118/80         Passed - Last Heart Rate in normal range    Pulse Readings from Last 1 Encounters:  03/09/24 88         Passed - Valid encounter within last 12 months    Recent Outpatient Visits           3 months ago Encounter for general adult medical examination with abnormal findings   [place] [facility] [place], [place] W, NP               omeprazole  (PRILOSEC) 40 MG capsule [Pharmacy Med Name: OMEPRAZOLE  DR 40 MG CAPSULE] 90 capsule 0    Sig: TAKE 1 CAPSULE BY MOUTH EVERY DAY     Gastroenterology: Proton Pump Inhibitors Passed - 06/11/2024  3:30 PM      Passed - Valid encounter within last 12 months    Recent Outpatient Visits           3 months ago Encounter for general adult medical examination with abnormal findings   [facility] [place], Angeline ORN, NP

## 2024-06-19 DIAGNOSIS — H04301 Unspecified dacryocystitis of right lacrimal passage: Secondary | ICD-10-CM | POA: Diagnosis not present

## 2024-07-02 DIAGNOSIS — H04301 Unspecified dacryocystitis of right lacrimal passage: Secondary | ICD-10-CM | POA: Diagnosis not present

## 2024-07-02 DIAGNOSIS — J45909 Unspecified asthma, uncomplicated: Secondary | ICD-10-CM | POA: Diagnosis not present

## 2024-07-02 DIAGNOSIS — I10 Essential (primary) hypertension: Secondary | ICD-10-CM | POA: Diagnosis not present

## 2024-07-02 DIAGNOSIS — G4733 Obstructive sleep apnea (adult) (pediatric): Secondary | ICD-10-CM | POA: Diagnosis not present

## 2024-07-02 DIAGNOSIS — H04551 Acquired stenosis of right nasolacrimal duct: Secondary | ICD-10-CM | POA: Diagnosis not present

## 2024-07-02 DIAGNOSIS — Z6838 Body mass index (BMI) 38.0-38.9, adult: Secondary | ICD-10-CM | POA: Diagnosis not present

## 2024-07-02 DIAGNOSIS — K219 Gastro-esophageal reflux disease without esophagitis: Secondary | ICD-10-CM | POA: Diagnosis not present

## 2024-07-02 DIAGNOSIS — E669 Obesity, unspecified: Secondary | ICD-10-CM | POA: Diagnosis not present

## 2024-08-02 ENCOUNTER — Other Ambulatory Visit: Payer: Self-pay | Admitting: Internal Medicine

## 2024-08-03 NOTE — Telephone Encounter (Signed)
 Requested Prescriptions  Pending Prescriptions Disp Refills   losartan  (COZAAR ) 100 MG tablet [Pharmacy Med Name: LOSARTAN  POTASSIUM 100 MG TAB] 45 tablet 0    Sig: TAKE 1/2 TABLET BY MOUTH DAILY     Cardiovascular:  Angiotensin Receptor Blockers Passed - 08/03/2024  4:04 PM      Passed - Cr in normal range and within 180 days    Creat  Date Value Ref Range Status  04/29/2024 0.88 0.70 - 1.30 mg/dL Final         Passed - K in normal range and within 180 days    Potassium  Date Value Ref Range Status  04/29/2024 3.7 3.5 - 5.3 mmol/L Final         Passed - Patient is not pregnant      Passed - Last BP in normal range    BP Readings from Last 1 Encounters:  03/09/24 118/80         Passed - Valid encounter within last 6 months    Recent Outpatient Visits           5 months ago Encounter for general adult medical examination with abnormal findings   [place] [facility] [place], Angeline ORN, NP

## 2024-08-10 ENCOUNTER — Ambulatory Visit: Admitting: Sleep Medicine

## 2024-08-10 ENCOUNTER — Encounter: Payer: Self-pay | Admitting: Sleep Medicine

## 2024-08-10 VITALS — BP 120/80 | HR 94 | Temp 97.8°F | Ht 67.0 in | Wt 262.4 lb

## 2024-08-10 DIAGNOSIS — G4733 Obstructive sleep apnea (adult) (pediatric): Secondary | ICD-10-CM

## 2024-08-10 DIAGNOSIS — I10 Essential (primary) hypertension: Secondary | ICD-10-CM

## 2024-08-10 DIAGNOSIS — Z6841 Body Mass Index (BMI) 40.0 and over, adult: Secondary | ICD-10-CM | POA: Diagnosis not present

## 2024-08-10 NOTE — Progress Notes (Signed)
 Name:Jonathan [name] MRN: [id number] DOB: [birth year]   CHIEF COMPLAINT:  CPAP F/U   HISTORY OF PRESENT ILLNESS:  Mr. Obryant is a 53 y.o. w/ a h/o OSA, obesity, HTN, RLS, GERD, anxiety and obesity who presents for CPAP follow up visit. Reports not using CPAP therapy due to having an infected tear duct and undergoing surgery recently. Reports wanting to resume CPAP therapy.    EPWORTH SLEEP SCORE     03/09/2024   11:00 AM 01/26/2022   10:00 AM 12/13/2021   10:00 AM  Results of the Epworth flowsheet  Sitting and reading 3 1 0  Watching TV 3 1 1   Sitting, inactive in a public place (e.g. a theatre or a meeting) 2 0 0  As a passenger in a car for an hour without a break 3 1 1   Lying down to rest in the afternoon when circumstances permit 3 2 3   Sitting and talking to someone 0 0 0  Sitting quietly after a lunch without alcohol 2 1 1   In a car, while stopped for a few minutes in traffic 0 0 0  Total score 16 6 6     PAST MEDICAL HISTORY :   has a past medical history of Anxiety, GERD (gastroesophageal reflux disease), and Hypertension.  has a past surgical history that includes Wisdom tooth extraction (1997); Colonoscopy with propofol  (N/A, 12/04/2021); and polypectomy (N/A, 12/04/2021). Prior to Admission medications   Medication Sig Start Date End Date Taking? Authorizing Provider  albuterol  (VENTOLIN  HFA) 108 (90 Base) MCG/ACT inhaler INHALE 2 PUFFS BY MOUTH EVERY 6 HOURS AS NEEDED 05/07/22  Yes Baity, Angeline ORN, NP  ALPRAZolam  (XANAX ) 0.25 MG tablet Take 1 tablet (0.25 mg total) by mouth daily as needed for anxiety. 03/04/24  Yes Antonette Angeline ORN, NP  aspirin  EC 81 MG tablet Take 1 tablet (81 mg total) by mouth daily. Swallow whole. 07/18/22  Yes Baity, Angeline ORN, NP  budesonide-formoterol (SYMBICORT) 160-4.5 MCG/ACT inhaler INHALE 2 PUFFS TWO TIMES A DAY. 12/13/23  Yes Baity, Angeline ORN, NP  citalopram  (CELEXA ) 40 MG tablet TAKE 1 TABLET BY MOUTH EVERY DAY 12/12/23  Yes Antonette Angeline ORN, NP  hydrochlorothiazide  (MICROZIDE ) 12.5 MG capsule TAKE 1 CAPSULE BY MOUTH EVERY DAY 10/30/23  Yes Antonette Angeline ORN, NP  losartan  (COZAAR ) 100 MG tablet TAKE 1/2 TABLET BY MOUTH DAILY 01/28/24  Yes Antonette Angeline ORN, NP  Multiple Vitamin (MULTIVITAMIN) tablet Take 1 tablet by mouth daily.   Yes [provider]  omeprazole  (PRILOSEC) 40 MG capsule TAKE 1 CAPSULE BY MOUTH EVERY DAY 12/12/23  Yes Baity, Angeline ORN, NP  rOPINIRole  (REQUIP ) 2 MG tablet TAKE 1 TABLET BY MOUTH THREE TIMES A DAY 12/13/23  Yes Antonette Angeline ORN, NP  Semaglutide -Weight Management 0.5 MG/0.5ML SOAJ Inject 0.5 mg into the skin once a week. 03/04/24  Yes Baity, Angeline ORN, NP  ferrous sulfate  325 (65 FE) MG tablet TAKE 1 TABLET BY MOUTH EVERY DAY WITH BREAKFAST Patient not taking: Reported on 03/09/2024 11/01/23   Parrett, Madelin RAMAN, NP   No Known Allergies  FAMILY HISTORY:  family history includes Breast cancer in his mother; Hypertension in his mother; Prostate cancer in his father. SOCIAL HISTORY:  reports that he quit smoking about 45 years ago. His smoking use included cigarettes. His smokeless tobacco use includes chew. He reports that he does not drink alcohol and does not use drugs.   Review of Systems:  Gen:  Denies  fever, sweats, chills weight loss  HEENT: Denies blurred vision, double vision, ear pain, eye pain, hearing loss, nose bleeds, sore throat Cardiac:  No dizziness, chest pain or heaviness, chest tightness,edema, No JVD Resp:   No cough, -sputum production, -shortness of breath,-wheezing, -hemoptysis,  Gi: Denies swallowing difficulty, stomach pain, nausea or vomiting, diarrhea, constipation, bowel incontinence Gu:  Denies bladder incontinence, burning urine Ext:   Denies Joint pain, stiffness or swelling Skin: Denies  skin rash, easy bruising or bleeding or hives Endoc:  Denies polyuria, polydipsia , polyphagia or weight change Psych:   Denies depression, insomnia or hallucinations  Other:   All other systems negative  VITAL SIGNS: BP 120/80   Pulse 94   Temp 97.8 F (36.6 C)   Ht 5' 7 (1.702 m)   Wt 262 lb 6.4 oz (119 kg)   SpO2 94%   BMI 41.10 kg/m     Physical Examination:   General Appearance: No distress  EYES PERRLA, EOM intact.   NECK Supple, No JVD Pulmonary: normal breath sounds, No wheezing.  CardiovascularNormal S1,S2.  No m/r/g.   Abdomen: Benign, Soft, non-tender. Skin:   warm, no rashes, no ecchymosis  Extremities: normal, no cyanosis, clubbing. Neuro:without focal findings,  speech normal  PSYCHIATRIC: Mood, affect within normal limits.   ASSESSMENT AND PLAN  OSA Counseled patient on restarting CPAP therapy. Discussed the consequences of untreated sleep apnea. Advised not to drive drowsy for safety of patient and others. Will follow up in 3 months.    HTN Stable, on current management. Following with PCP.   Morbid obesity Counseled patient on diet and lifestyle modification.    Patient  satisfied with Plan of action and management. All questions answered  I spent a total of 32 minutes reviewing chart data, face-to-face evaluation with the patient, counseling and coordination of care as detailed above.    Zariana Strub, M.D.  Sleep Medicine [facility]

## 2024-08-10 NOTE — Patient Instructions (Addendum)

## 2024-08-28 ENCOUNTER — Telehealth: Payer: Self-pay

## 2024-08-28 ENCOUNTER — Other Ambulatory Visit (HOSPITAL_COMMUNITY): Payer: Self-pay

## 2024-08-28 NOTE — Telephone Encounter (Signed)
 Pharmacy Patient Advocate Encounter   Received notification from Onbase that prior authorization for Wegovy  0.5mg /0.9ml Pen is required/requested.   Insurance verification completed.   The patient is insured through [employer].   Per test claim: The current 28 day co-pay is, $770.29.  No PA needed at this time. This test claim was processed through [facility]- copay amounts may vary at other pharmacies due to pharmacy/plan contracts, or as the patient moves through the different stages of their insurance plan.

## 2024-09-02 ENCOUNTER — Ambulatory Visit: Payer: Self-pay | Admitting: Internal Medicine

## 2024-09-02 ENCOUNTER — Encounter: Payer: Self-pay | Admitting: Internal Medicine

## 2024-09-02 VITALS — BP 122/74 | Ht 67.0 in | Wt 259.0 lb

## 2024-09-02 DIAGNOSIS — G2581 Restless legs syndrome: Secondary | ICD-10-CM

## 2024-09-02 DIAGNOSIS — I700 Atherosclerosis of aorta: Secondary | ICD-10-CM

## 2024-09-02 DIAGNOSIS — F419 Anxiety disorder, unspecified: Secondary | ICD-10-CM

## 2024-09-02 DIAGNOSIS — K760 Fatty (change of) liver, not elsewhere classified: Secondary | ICD-10-CM

## 2024-09-02 DIAGNOSIS — I10 Essential (primary) hypertension: Secondary | ICD-10-CM | POA: Diagnosis not present

## 2024-09-02 DIAGNOSIS — Z23 Encounter for immunization: Secondary | ICD-10-CM | POA: Diagnosis not present

## 2024-09-02 DIAGNOSIS — E782 Mixed hyperlipidemia: Secondary | ICD-10-CM

## 2024-09-02 DIAGNOSIS — R7303 Prediabetes: Secondary | ICD-10-CM | POA: Diagnosis not present

## 2024-09-02 DIAGNOSIS — G4733 Obstructive sleep apnea (adult) (pediatric): Secondary | ICD-10-CM

## 2024-09-02 DIAGNOSIS — K219 Gastro-esophageal reflux disease without esophagitis: Secondary | ICD-10-CM

## 2024-09-02 DIAGNOSIS — F32A Depression, unspecified: Secondary | ICD-10-CM

## 2024-09-02 DIAGNOSIS — E66813 Obesity, class 3: Secondary | ICD-10-CM

## 2024-09-02 DIAGNOSIS — J4530 Mild persistent asthma, uncomplicated: Secondary | ICD-10-CM

## 2024-09-02 MED ORDER — AIRSUPRA 90-80 MCG/ACT IN AERO: 2.0000 | INHALATION_SPRAY | Freq: Three times a day (TID) | RESPIRATORY_TRACT | 1 refills | Status: AC | PRN

## 2024-09-02 MED ORDER — OMEPRAZOLE 20 MG PO CPDR: 20.0000 mg | DELAYED_RELEASE_CAPSULE | Freq: Every day | ORAL | 1 refills | Status: AC

## 2024-09-02 MED ORDER — ROPINIROLE HCL 2 MG PO TABS: ORAL_TABLET | ORAL | 1 refills | Status: AC

## 2024-09-02 NOTE — Assessment & Plan Note (Signed)
 Advised him to take Symbicort 160-4.5 mcg per actuation 2 times daily as previously prescribed We will discontinue albuterol  and close with Airsupra 90-80 mcg per inhaler every 8 hours as needed We will monitor

## 2024-09-02 NOTE — Assessment & Plan Note (Signed)
 Complicated by morbid obesity Try to identify and avoid foods that trigger reflux Will decrease omeprazole  to 20 mg daily Encourage weight loss as this can reduce reflux symptoms

## 2024-09-02 NOTE — Assessment & Plan Note (Signed)
 Complicated by morbid obesity Controlled on losartan  100 mg and hydrochlorothiazide  12.5 mg daily Reinforced DASH diet and exercise for weight loss C-Met today

## 2024-09-02 NOTE — Assessment & Plan Note (Signed)
 Complicated by morbid obesity C-Met and lipid profile today Encouraged him to consume a low-fat diet

## 2024-09-02 NOTE — Assessment & Plan Note (Signed)
 Stable on citalopram  40 mg daily and alprazolam  0.25 mg daily as needed as needed for flying Support offered

## 2024-09-02 NOTE — Assessment & Plan Note (Signed)
 Complicated by morbid obesity C-Met and lipid profile today Not currently on statin therapy Continue baby aspirin  81 mg daily Encouraged low-fat diet

## 2024-09-02 NOTE — Patient Instructions (Signed)

## 2024-09-02 NOTE — Assessment & Plan Note (Signed)
 Complicated by morbid obesity A1c today Encourage low-carb diet and exercise for weight loss

## 2024-09-02 NOTE — Assessment & Plan Note (Signed)
 Complicated by morbid obesity Encouraged exercise for weight loss C-Met today

## 2024-09-02 NOTE — Progress Notes (Signed)
 Subjective:    Patient ID: Jonathan [name], male    DOB: October 09, 1971, 53 y.o.   MRN: [id number]  HPI  Patient presents to clinic today for 74-month follow-up of chronic conditions.  OSA: He averages 4 hours of sleep per night with use of CPAP intermittently.  Sleep study from 02/2022 reviewed.  Anxiety and depression: Chronic, managed on citalopram .  He takes alprazolam  only as needed for flying.  He is not currently seeing a therapist.  He denies SI/HI.  HTN: His BP today is 122/74.  He is taking losartan  and HCTZ as prescribed.  ECG from 05/2012 reviewed.  GERD: He is not sure what triggers this, maybe eating tomato based foods, eating late at night. He denies breakthrough on omeprazole .  There is no upper GI on file.  RLS: Managed on ropinirole .  He does not follow with neurology.  Prediabetes: His last A1c was 5.8 %, 02/2024.  He does not check his sugars.  He is no longer taking semaglutide  as prescribed.  HLD with aortic atherosclerosis: His last LDL was 82, triglycerides 860, 02/2024.  He is not taking any cholesterol-lowering medication at this time.  He is taking aspirin .  He does not consume a low-fat diet.  Allergy induced asthma: He is not taking symbicort and albuterol  as prescribed.  He is not taking any antihistamines.  PFTs from 05/2022 reviewed.  He does not follow-up with the asthma or allergy clinic.  Fatty liver disease: His last AST/ALT was 18/25, 04/2024.  MRI from 02/2022 reviewed.  Review of Systems     Past Medical History:  Diagnosis Date   Anxiety    GERD (gastroesophageal reflux disease)    Hypertension     Current Outpatient Medications  Medication Sig Dispense Refill   albuterol  (VENTOLIN  HFA) 108 (90 Base) MCG/ACT inhaler INHALE 2 PUFFS BY MOUTH EVERY 6 HOURS AS NEEDED 18 each 2   ALPRAZolam  (XANAX ) 0.25 MG tablet Take 1 tablet (0.25 mg total) by mouth daily as needed for anxiety. 10 tablet 0   aspirin  EC 81 MG tablet Take 1 tablet (81 mg total) by  mouth daily. Swallow whole. 30 tablet 12   budesonide-formoterol (SYMBICORT) 160-4.5 MCG/ACT inhaler INHALE 2 PUFFS TWO TIMES A DAY. 10.2 each 2   citalopram  (CELEXA ) 40 MG tablet TAKE 1 TABLET BY MOUTH EVERY DAY 90 tablet 0   fluticasone (FLONASE) 50 MCG/ACT nasal spray Place 1 spray into both nostrils daily.     hydrochlorothiazide  (MICROZIDE ) 12.5 MG capsule Take 1 capsule (12.5 mg total) by mouth daily. TAKE 1 CAPSULE BY MOUTH EVERY DAY 90 capsule 1   losartan  (COZAAR ) 100 MG tablet TAKE 1/2 TABLET BY MOUTH DAILY 45 tablet 0   Multiple Vitamin (MULTIVITAMIN) tablet Take 1 tablet by mouth daily.     omeprazole  (PRILOSEC) 40 MG capsule TAKE 1 CAPSULE BY MOUTH EVERY DAY 90 capsule 0   rOPINIRole  (REQUIP ) 2 MG tablet TAKE 1 TABLET BY MOUTH THREE TIMES A DAY 270 tablet 0   Semaglutide -Weight Management 0.5 MG/0.5ML SOAJ Inject 0.5 mg into the skin once a week. 6 mL 0   No current facility-administered medications for this visit.    No Known Allergies  Family History  Problem Relation Age of Onset   Breast cancer Mother    Hypertension Mother    Prostate cancer Father     Social History   Socioeconomic History   Marital status: Married    Spouse name: Not on file   Number of  children: 0   Years of education: Not on file   Highest education level: Not on file  Occupational History   Not on file  Tobacco Use   Smoking status: Former    Current packs/day: 0.00    Types: Cigarettes    Quit date: 1980    Years since quitting: 45.8   Smokeless tobacco: Current    Types: Chew  Vaping Use   Vaping status: Never Used  Substance and Sexual Activity   Alcohol use: No    Alcohol/week: 0.0 standard drinks of alcohol   Drug use: Never   Sexual activity: Not on file  Other Topics Concern   Not on file  Social History Narrative   Not on file   Social Drivers of Health   Financial Resource Strain: Low Risk  (04/08/2022)   [facility]   Overall Financial Resource  Strain (CARDIA)    Difficulty of Paying Living Expenses: Not hard at all  Food Insecurity: No Food Insecurity (04/08/2022)   [facility]   Hunger Vital Sign    Worried About Running Out of Food in the Last Year: Never true    Ran Out of Food in the Last Year: Never true  Transportation Needs: No Transportation Needs (04/08/2022)   Received from [facility]   PRAPARE - Transportation    Lack of Transportation (Medical): No    Lack of Transportation (Non-Medical): No  Physical Activity: Not on file  Stress: Not on file  Social Connections: Not on file  Intimate Partner Violence: Not on file     Constitutional: Denies fever, malaise, fatigue, headache or abrupt weight changes.  HEENT: Denies eye pain, eye redness, ear pain, ringing in the ears, wax buildup, runny nose, nasal congestion, bloody nose, or sore throat. Respiratory: Denies difficulty breathing, shortness of breath, cough or sputum production.   Cardiovascular: Denies chest pain, chest tightness, palpitations or swelling in the hands or feet.  Gastrointestinal: Denies abdominal pain, bloating, constipation, diarrhea or blood in the stool.  GU: Denies urgency, frequency, pain with urination, burning sensation, blood in urine, odor or discharge. Musculoskeletal: Denies decrease in range of motion, difficulty with gait, muscle pain or joint pain and swelling.  Skin: Denies redness, rashes, lesions or ulcercations.  Neurological: Patient reports restless legs.  Denies dizziness, difficulty with memory, difficulty with speech or problems with balance and coordination.  Psych: Patient has a history of anxiety and depression.  Denies SI/HI.  No other specific complaints in a complete review of systems (except as listed in HPI above).  Objective:   Physical Exam BP 122/74 (BP Location: Left Arm, Patient Position: Sitting, Cuff Size: Large)   Ht 5' 7 (1.702 m)   Wt 259 lb (117.5 kg)   BMI 40.57 kg/m    Wt  Readings from Last 3 Encounters:  08/10/24 262 lb 6.4 oz (119 kg)  03/09/24 262 lb 12.8 oz (119.2 kg)  03/04/24 267 lb 6.4 oz (121.3 kg)    General: Appears his stated age, obese, in NAD. Skin: Warm, dry and intact.  HEENT: Head: normal shape and size; Eyes: sclera white, no icterus, conjunctiva pink, PERRLA and EOMs intact;  Cardiovascular: Normal rate and rhythm. S1,S2 noted.  No murmur, rubs or gallops noted. No JVD or BLE edema. No carotid bruits noted. Pulmonary/Chest: Normal effort and positive vesicular breath sounds. No respiratory distress. No wheezes, rales or ronchi noted.  Abdomen: Soft and nontender. Normal bowel sounds.  Musculoskeletal:  No difficulty with gait.  Neurological: Alert and oriented. Coordination normal.  Psychiatric: Mood and affect normal. Behavior is normal. Judgment and thought content normal.   BMET    Component Value Date/Time   NA 137 04/29/2024 1414   K 3.7 04/29/2024 1414   CL 101 04/29/2024 1414   CO2 28 04/29/2024 1414   GLUCOSE 113 04/29/2024 1414   BUN 17 04/29/2024 1414   CREATININE 0.88 04/29/2024 1414   CALCIUM 9.3 04/29/2024 1414   GFRNONAA 95.90 09/13/2010 1103    Lipid Panel     Component Value Date/Time   CHOL 144 04/29/2024 1414   TRIG 139 04/29/2024 1414   HDL 38 (L) 04/29/2024 1414   CHOLHDL 3.8 04/29/2024 1414   VLDL 51.4 (H) 01/31/2021 1035   LDLCALC 82 04/29/2024 1414    CBC    Component Value Date/Time   WBC 7.0 04/29/2024 1414   RBC 5.22 04/29/2024 1414   HGB 14.8 04/29/2024 1414   HCT 45.6 04/29/2024 1414   PLT 184 04/29/2024 1414   MCV 87.4 04/29/2024 1414   MCH 28.4 04/29/2024 1414   MCHC 32.5 04/29/2024 1414   RDW 12.9 04/29/2024 1414   LYMPHSABS 1.9 02/27/2016 1032   MONOABS 0.6 02/27/2016 1032   EOSABS 0.1 02/27/2016 1032   BASOSABS 0.0 02/27/2016 1032    Hgb A1C Lab Results  Component Value Date   HGBA1C 5.8 (H) 04/29/2024           Assessment & Plan:      RTC in 6 months for your  annual exam Angeline Laura, NP

## 2024-09-02 NOTE — Assessment & Plan Note (Signed)
 Encouraged diet and exercise for weight loss ?

## 2024-09-02 NOTE — Assessment & Plan Note (Signed)
 Complicated by morbid obesity Encourage weight loss as this can help reduce sleep apnea symptoms Continue CPAP use

## 2024-09-02 NOTE — Assessment & Plan Note (Signed)
 Will increase ropinirole  to 2 mg morning, afternoon and 4 mg in the evening Will monitor

## 2024-09-03 ENCOUNTER — Ambulatory Visit: Payer: Self-pay | Admitting: Internal Medicine

## 2024-09-03 LAB — COMPREHENSIVE METABOLIC PANEL WITH GFR
AG Ratio: 1.9 (calc) (ref 1.0–2.5)
ALT: 31 U/L (ref 9–46)
AST: 21 U/L (ref 10–35)
Albumin: 4.7 g/dL (ref 3.6–5.1)
Alkaline phosphatase (APISO): 81 U/L (ref 35–144)
BUN/Creatinine Ratio: 7 (calc) (ref 6–22)
BUN: 6 mg/dL — ABNORMAL LOW (ref 7–25)
CO2: 27 mmol/L (ref 20–32)
Calcium: 9.3 mg/dL (ref 8.6–10.3)
Chloride: 101 mmol/L (ref 98–110)
Creat: 0.89 mg/dL (ref 0.70–1.30)
Globulin: 2.5 g/dL (ref 1.9–3.7)
Glucose, Bld: 96 mg/dL (ref 65–99)
Potassium: 4 mmol/L (ref 3.5–5.3)
Sodium: 138 mmol/L (ref 135–146)
Total Bilirubin: 0.8 mg/dL (ref 0.2–1.2)
Total Protein: 7.2 g/dL (ref 6.1–8.1)
eGFR: 102 mL/min/1.73m2 (ref >=60)

## 2024-09-03 LAB — CBC
HCT: 49.1 % (ref 38.5–50.0)
Hemoglobin: 16.4 g/dL (ref 13.2–17.1)
MCH: 28.5 pg (ref 27.0–33.0)
MCHC: 33.4 g/dL (ref 32.0–36.0)
MCV: 85.4 fL (ref 80.0–100.0)
MPV: 10.2 fL (ref 7.5–12.5)
Platelets: 186 Thousand/uL (ref 140–400)
RBC: 5.75 Million/uL (ref 4.20–5.80)
RDW: 13.1 % (ref 11.0–15.0)
WBC: 6.4 Thousand/uL (ref 3.8–10.8)

## 2024-09-03 LAB — HEMOGLOBIN A1C
Hgb A1c MFr Bld: 5.8 % — ABNORMAL HIGH (ref <5.7)
Mean Plasma Glucose: 120 mg/dL
eAG (mmol/L): 6.6 mmol/L

## 2024-09-03 LAB — LIPID PANEL
Cholesterol: 170 mg/dL (ref <200)
HDL: 38 mg/dL — ABNORMAL LOW (ref >=40)
LDL Cholesterol (Calc): 107 mg/dL — ABNORMAL HIGH
Non-HDL Cholesterol (Calc): 132 mg/dL — ABNORMAL HIGH (ref <130)
Total CHOL/HDL Ratio: 4.5 (calc) (ref <5.0)
Triglycerides: 132 mg/dL (ref <150)

## 2024-09-04 ENCOUNTER — Other Ambulatory Visit (HOSPITAL_COMMUNITY): Payer: Self-pay

## 2024-09-07 ENCOUNTER — Other Ambulatory Visit: Payer: Self-pay | Admitting: Internal Medicine

## 2024-09-08 NOTE — Telephone Encounter (Signed)
 Refusing Omeprazole  due to dosage changed on 09/02/24.  Requested Prescriptions  Pending Prescriptions Disp Refills   citalopram  (CELEXA ) 40 MG tablet [Pharmacy Med Name: CITALOPRAM  HBR 40 MG TABLET] 90 tablet 1    Sig: TAKE 1 TABLET BY MOUTH EVERY DAY     Psychiatry:  Antidepressants - SSRI Passed - 09/08/2024 12:58 PM      Passed - Completed PHQ-2 or PHQ-9 in the last 360 days      Passed - Valid encounter within last 6 months    Recent Outpatient Visits           6 days ago Prediabetes   [place] [facility] [place], Angeline ORN, NP   6 months ago Encounter for general adult medical examination with abnormal findings   [place] [facility] [place], Angeline ORN, NP              Refused Prescriptions Disp Refills   omeprazole  (PRILOSEC) 40 MG capsule [Pharmacy Med Name: OMEPRAZOLE  DR 40 MG CAPSULE] 90 capsule 0    Sig: TAKE 1 CAPSULE BY MOUTH EVERY DAY     Gastroenterology: Proton Pump Inhibitors Passed - 09/08/2024 12:58 PM      Passed - Valid encounter within last 12 months    Recent Outpatient Visits           6 days ago Prediabetes   [place] [facility], Angeline ORN, NP   6 months ago Encounter for general adult medical examination with abnormal findings   [place] [facility] [place], Angeline ORN, NP

## 2024-10-23 ENCOUNTER — Other Ambulatory Visit: Payer: Self-pay | Admitting: Internal Medicine

## 2024-10-26 NOTE — Telephone Encounter (Signed)
 Requested Prescriptions  Pending Prescriptions Disp Refills   hydrochlorothiazide  (MICROZIDE ) 12.5 MG capsule [Pharmacy Med Name: HYDROCHLOROTHIAZIDE  12.5 MG CP] 90 capsule 1    Sig: TAKE 1 CAPSULE BY MOUTH EVERY DAY     Cardiovascular: Diuretics - Thiazide Passed - 10/26/2024 12:06 PM      Passed - Cr in normal range and within 180 days    Creat  Date Value Ref Range Status  09/02/2024 0.89 0.70 - 1.30 mg/dL Final         Passed - K in normal range and within 180 days    Potassium  Date Value Ref Range Status  09/02/2024 4.0 3.5 - 5.3 mmol/L Final         Passed - Na in normal range and within 180 days    Sodium  Date Value Ref Range Status  09/02/2024 138 135 - 146 mmol/L Final         Passed - Last BP in normal range    BP Readings from Last 1 Encounters:  09/02/24 122/74         Passed - Valid encounter within last 6 months    Recent Outpatient Visits           1 month ago Prediabetes   [place] [facility], Angeline ORN, NP   7 months ago Encounter for general adult medical examination with abnormal findings   [place] [facility] [place], Angeline ORN, NP               losartan  (COZAAR ) 100 MG tablet [Pharmacy Med Name: LOSARTAN  POTASSIUM 100 MG TAB] 45 tablet 0    Sig: TAKE 1/2 TABLET BY MOUTH DAILY     Cardiovascular:  Angiotensin Receptor Blockers Passed - 10/26/2024 12:06 PM      Passed - Cr in normal range and within 180 days    Creat  Date Value Ref Range Status  09/02/2024 0.89 0.70 - 1.30 mg/dL Final         Passed - K in normal range and within 180 days    Potassium  Date Value Ref Range Status  09/02/2024 4.0 3.5 - 5.3 mmol/L Final         Passed - Patient is not pregnant      Passed - Last BP in normal range    BP Readings from Last 1 Encounters:  09/02/24 122/74         Passed - Valid encounter within last 6 months    Recent Outpatient Visits           1 month ago Prediabetes   [place]  [facility] [place], Angeline ORN, NP   7 months ago Encounter for general adult medical examination with abnormal findings   [place] [facility] [place], Angeline ORN, NP

## 2024-10-28 ENCOUNTER — Ambulatory Visit: Admitting: Sleep Medicine

## 2024-10-28 ENCOUNTER — Encounter: Payer: Self-pay | Admitting: Sleep Medicine

## 2024-10-28 VITALS — BP 100/70 | HR 108 | Temp 98.6°F | Ht 67.0 in | Wt 257.8 lb

## 2024-10-28 DIAGNOSIS — G4733 Obstructive sleep apnea (adult) (pediatric): Secondary | ICD-10-CM | POA: Diagnosis not present

## 2024-10-28 DIAGNOSIS — I10 Essential (primary) hypertension: Secondary | ICD-10-CM

## 2024-10-28 DIAGNOSIS — Z87891 Personal history of nicotine dependence: Secondary | ICD-10-CM | POA: Diagnosis not present

## 2024-10-28 MED ORDER — ZEPBOUND 2.5 MG/0.5ML ~~LOC~~ SOAJ: 2.5000 mg | SUBCUTANEOUS | 1 refills | Status: DC

## 2024-10-28 NOTE — Patient Instructions (Signed)
 SABRA

## 2024-10-28 NOTE — Progress Notes (Signed)
 Name:Jonathan [name] MRN: [id number] DOB: 1971/03/22   CHIEF COMPLAINT:  CPAP F/U   HISTORY OF PRESENT ILLNESS:  Jonathan [name] is a 53 y.o. w/ a h/o OSA, obesity, HTN, RLS, GERD, anxiety and obesity who presents for CPAP follow up visit. Reports difficulty using CPAP therapy due to pressure discomfort and air leaks.    EPWORTH SLEEP SCORE     03/09/2024   11:00 AM 01/26/2022   10:00 AM 12/13/2021   10:00 AM  Results of the Epworth flowsheet  Sitting and reading 3 1 0  Watching TV 3 1 1   Sitting, inactive in a public place (e.g. a theatre or a meeting) 2 0 0  As a passenger in a car for an hour without a break 3 1 1   Lying down to rest in the afternoon when circumstances permit 3 2 3   Sitting and talking to someone 0 0 0  Sitting quietly after a lunch without alcohol 2 1 1   In a car, while stopped for a few minutes in traffic 0 0 0  Total score 16 6 6     PAST MEDICAL HISTORY :   has a past medical history of Anxiety, GERD (gastroesophageal reflux disease), and Hypertension.  has a past surgical history that includes Wisdom tooth extraction (1997); Colonoscopy with propofol  (N/A, 12/04/2021); and polypectomy (N/A, 12/04/2021). Prior to Admission medications   Medication Sig Start Date End Date Taking? Authorizing Provider  albuterol  (VENTOLIN  HFA) 108 (90 Base) MCG/ACT inhaler INHALE 2 PUFFS BY MOUTH EVERY 6 HOURS AS NEEDED 05/07/22  Yes Baity, Angeline ORN, NP  ALPRAZolam  (XANAX ) 0.25 MG tablet Take 1 tablet (0.25 mg total) by mouth daily as needed for anxiety. 03/04/24  Yes Antonette Angeline ORN, NP  aspirin  EC 81 MG tablet Take 1 tablet (81 mg total) by mouth daily. Swallow whole. 07/18/22  Yes Baity, Angeline ORN, NP  budesonide-formoterol (SYMBICORT) 160-4.5 MCG/ACT inhaler INHALE 2 PUFFS TWO TIMES A DAY. 12/13/23  Yes Antonette Angeline ORN, NP  citalopram  (CELEXA ) 40 MG tablet TAKE 1 TABLET BY MOUTH EVERY DAY 12/12/23  Yes Antonette Angeline ORN, NP  hydrochlorothiazide  (MICROZIDE ) 12.5 MG  capsule TAKE 1 CAPSULE BY MOUTH EVERY DAY 10/30/23  Yes Antonette Angeline ORN, NP  losartan  (COZAAR ) 100 MG tablet TAKE 1/2 TABLET BY MOUTH DAILY 01/28/24  Yes Antonette Angeline ORN, NP  Multiple Vitamin (MULTIVITAMIN) tablet Take 1 tablet by mouth daily.   Yes [provider]  omeprazole  (PRILOSEC) 40 MG capsule TAKE 1 CAPSULE BY MOUTH EVERY DAY 12/12/23  Yes Baity, Angeline ORN, NP  rOPINIRole  (REQUIP ) 2 MG tablet TAKE 1 TABLET BY MOUTH THREE TIMES A DAY 12/13/23  Yes Antonette Angeline ORN, NP  Semaglutide -Weight Management 0.5 MG/0.5ML SOAJ Inject 0.5 mg into the skin once a week. 03/04/24  Yes Baity, Angeline ORN, NP  ferrous sulfate  325 (65 FE) MG tablet TAKE 1 TABLET BY MOUTH EVERY DAY WITH BREAKFAST Patient not taking: Reported on 03/09/2024 11/01/23   Parrett, Madelin RAMAN, NP   No Known Allergies  FAMILY HISTORY:  family history includes Breast cancer in his mother; Hypertension in his mother; Prostate cancer in his father. SOCIAL HISTORY:  reports that he quit smoking about 45 years ago. His smoking use included cigarettes. His smokeless tobacco use includes chew. He reports that he does not drink alcohol and does not use drugs.   Review of Systems:  Gen:  Denies  fever, sweats, chills weight loss  HEENT: Denies blurred vision,  double vision, ear pain, eye pain, hearing loss, nose bleeds, sore throat Cardiac:  No dizziness, chest pain or heaviness, chest tightness,edema, No JVD Resp:   No cough, -sputum production, -shortness of breath,-wheezing, -hemoptysis,  Gi: Denies swallowing difficulty, stomach pain, nausea or vomiting, diarrhea, constipation, bowel incontinence Gu:  Denies bladder incontinence, burning urine Ext:   Denies Joint pain, stiffness or swelling Skin: Denies  skin rash, easy bruising or bleeding or hives Endoc:  Denies polyuria, polydipsia , polyphagia or weight change Psych:   Denies depression, insomnia or hallucinations  Other:  All other systems negative  VITAL SIGNS: BP 100/70    Pulse (!) 108   Temp 98.6 F (37 C)   Ht 5' 7 (1.702 m)   Wt 257 lb 12.8 oz (116.9 kg)   SpO2 95%   BMI 40.38 kg/m     Physical Examination:   General Appearance: No distress  EYES PERRLA, EOM intact.   NECK Supple, No JVD Pulmonary: normal breath sounds, No wheezing.  CardiovascularNormal S1,S2.  No m/r/g.   Abdomen: Benign, Soft, non-tender. Skin:   warm, no rashes, no ecchymosis  Extremities: normal, no cyanosis, clubbing. Neuro:without focal findings,  speech normal  PSYCHIATRIC: Mood, affect within normal limits.   ASSESSMENT AND PLAN  OSA Counseled patient on increasing CPAP compliance. To improve patient comfort, will change pressure range to 6-16 cm H2O. Discussed the consequences of untreated sleep apnea. Advised not to drive drowsy for safety of patient and others. Will follow up in 3 months.    HTN Stable, on current management. Following with PCP.    Patient  satisfied with Plan of action and management. All questions answered  I spent a total of 23 minutes reviewing chart data, face-to-face evaluation with the patient, counseling and coordination of care as detailed above.    Laela Deviney, M.D.  Sleep Medicine [facility]

## 2024-10-30 ENCOUNTER — Telehealth: Payer: Self-pay

## 2024-10-30 NOTE — Telephone Encounter (Signed)
*  Pulm  Pharmacy Patient Advocate Encounter   Received notification from Fax that prior authorization for Zepbound  2.5MG /0.5ML pen-injectors is required/requested.   Insurance verification completed.   The patient is insured through [facility].   Per test claim: PA required; PA submitted to above mentioned insurance via Latent Key/confirmation #/EOC [id number] Status is pending

## 2024-11-02 NOTE — Telephone Encounter (Signed)
 This is to inform you that we have received the medical information that your office has submitted for the above members Zepbound  (tirzepatide ) Exception request and at this time, the information submitted did not meet the plans criteria for medical necessity due to the following reason:. the use of this medication without inadequate treatment response, intolerance, or contraindication to at least TWO oral medications for weight management (e.g., benzphetamine, diethylpropion, phentermine, Qsymia, etc.) does not establish medical necessity for this drug.

## 2024-11-19 ENCOUNTER — Other Ambulatory Visit: Payer: Self-pay

## 2024-11-19 ENCOUNTER — Telehealth: Payer: Self-pay

## 2024-11-19 DIAGNOSIS — Z860100 Personal history of colon polyps, unspecified: Secondary | ICD-10-CM

## 2024-11-19 MED ORDER — NA SULFATE-K SULFATE-MG SULF 17.5-3.13-1.6 GM/177ML PO SOLN: 354.0000 mL | Freq: Once | ORAL | 0 refills | Status: AC

## 2024-11-19 NOTE — Telephone Encounter (Signed)
 Pulmonology pre-op was sent to Dr. Jess for clearance faxed to his office, awaiting on response.

## 2024-11-19 NOTE — Telephone Encounter (Signed)
 Gastroenterology Pre-Procedure Review  Request Date: 12/07/2024 Requesting Physician: Dr. Melany  PATIENT REVIEW QUESTIONS: The patient responded to the following health history questions as indicated:    1. Are you having any GI issues? no 2. Do you have a personal history of Polyps? yes (Dr. Jinny 12/04/2021 Polyps) 3. Do you have a family history of Colon Cancer or Polyps? no 4. Diabetes Mellitus? no 5. Joint replacements in the past 12 months?no 6. Major health problems in the past 3 months?no 7. Any artificial heart valves, MVP, or defibrillator?no    MEDICATIONS & ALLERGIES:    Patient reports the following regarding taking any anticoagulation/antiplatelet therapy:   Plavix, Coumadin, Eliquis, Xarelto, Lovenox , Pradaxa, Brilinta, or Effient? no Aspirin ? no  Patient confirms/reports the following medications:  Current Outpatient Medications  Medication Sig Dispense Refill   AIRSUPRA  90-80 MCG/ACT AERO Inhale 2 Inhalations into the lungs every 8 (eight) hours as needed (wheezing, shortness of breath, cough). 32.1 g 1   ALPRAZolam  (XANAX ) 0.25 MG tablet Take 1 tablet (0.25 mg total) by mouth daily as needed for anxiety. 10 tablet 0   aspirin  EC 81 MG tablet Take 1 tablet (81 mg total) by mouth daily. Swallow whole. 30 tablet 12   budesonide-formoterol (SYMBICORT) 160-4.5 MCG/ACT inhaler INHALE 2 PUFFS TWO TIMES A DAY. 10.2 each 2   citalopram  (CELEXA ) 40 MG tablet TAKE 1 TABLET BY MOUTH EVERY DAY 90 tablet 1   fluticasone (FLONASE) 50 MCG/ACT nasal spray Place 1 spray into both nostrils daily.     hydrochlorothiazide  (MICROZIDE ) 12.5 MG capsule TAKE 1 CAPSULE BY MOUTH EVERY DAY 90 capsule 1   losartan  (COZAAR ) 100 MG tablet TAKE 1/2 TABLET BY MOUTH DAILY 45 tablet 0   Multiple Vitamin (MULTIVITAMIN) tablet Take 1 tablet by mouth daily.     omeprazole  (PRILOSEC) 20 MG capsule Take 1 capsule (20 mg total) by mouth daily. 90 capsule 1   rOPINIRole  (REQUIP ) 2 MG tablet 1 tab PO qam. 1  tab PO q2pm, 2 tabs PO qpm 360 tablet 1   tirzepatide  (ZEPBOUND ) 2.5 MG/0.5ML Pen Inject 2.5 mg into the skin once a week. 2 mL 1   No current facility-administered medications for this visit.    Patient confirms/reports the following allergies:  Allergies[1]  No orders of the defined types were placed in this encounter.   AUTHORIZATION INFORMATION Primary Insurance: 1D#: Group #:  Secondary Insurance: 1D#: Group #:  SCHEDULE INFORMATION: Date: 11/19/2024 Time: Location: [facility] Dr. Melany     [1] No Known Allergies

## 2024-11-20 ENCOUNTER — Telehealth: Payer: Self-pay

## 2024-11-20 NOTE — Telephone Encounter (Signed)
 Reached out to pt per Dr. Chase request about improving compliance to be cleared for colonoscopy.

## 2024-12-02 ENCOUNTER — Telehealth: Payer: Self-pay

## 2024-12-02 NOTE — Telephone Encounter (Signed)
 Patient's wife Karna called stating that due to the weather, she would like to reschedule her husband's colonoscopy date. Therefore, it was changed from 12/07/2024 to 12/21/2024. Olam and Luke were notified.

## 2024-12-04 NOTE — Telephone Encounter (Signed)
 Dr. Jess stated that the patient is able to have his procedure if patient uses his CPAP device post op, esp in recovery. The form will be placed in his chart under media. I called the patient and had to leave him a detailed message letting him what Dr. Jess had stated for him to do. I then called patient's wife-Denise and let her know the above information. Karna agreed and stated that she would mention it to her husband.

## 2024-12-17 ENCOUNTER — Encounter: Payer: Self-pay | Admitting: Gastroenterology

## 2024-12-21 ENCOUNTER — Encounter: Admission: RE | Payer: Self-pay | Source: Home / Self Care

## 2024-12-21 ENCOUNTER — Ambulatory Visit: Admission: RE | Admit: 2024-12-21 | Source: Home / Self Care | Admitting: Gastroenterology

## 2024-12-21 HISTORY — DX: Sleep apnea, unspecified: G47.30

## 2025-01-27 ENCOUNTER — Ambulatory Visit: Admitting: Sleep Medicine

## 2025-03-10 ENCOUNTER — Encounter: Admitting: Internal Medicine
